# Patient Record
Sex: Female | Born: 1960 | Race: White | Hispanic: No | Marital: Married | Smoking: Current every day smoker
Health system: Southern US, Community
[De-identification: ages and names within clinical notes are randomized; demographics above are authoritative.]

## PROBLEM LIST (undated history)

## (undated) DIAGNOSIS — M1990 Unspecified osteoarthritis, unspecified site: Secondary | ICD-10-CM

## (undated) DIAGNOSIS — K219 Gastro-esophageal reflux disease without esophagitis: Secondary | ICD-10-CM

## (undated) DIAGNOSIS — R0602 Shortness of breath: Secondary | ICD-10-CM

## (undated) DIAGNOSIS — C3490 Malignant neoplasm of unspecified part of unspecified bronchus or lung: Secondary | ICD-10-CM

## (undated) DIAGNOSIS — Z720 Tobacco use: Secondary | ICD-10-CM

## (undated) DIAGNOSIS — F32A Depression, unspecified: Secondary | ICD-10-CM

## (undated) DIAGNOSIS — F419 Anxiety disorder, unspecified: Secondary | ICD-10-CM

## (undated) DIAGNOSIS — F329 Major depressive disorder, single episode, unspecified: Secondary | ICD-10-CM

## (undated) DIAGNOSIS — E785 Hyperlipidemia, unspecified: Secondary | ICD-10-CM

## (undated) DIAGNOSIS — Z8719 Personal history of other diseases of the digestive system: Secondary | ICD-10-CM

## (undated) HISTORY — PX: VAGINAL HYSTERECTOMY: SUR661

## (undated) HISTORY — PX: TUBAL LIGATION: SHX77

## (undated) HISTORY — PX: BREAST LUMPECTOMY: SHX2

## (undated) HISTORY — DX: Malignant neoplasm of unspecified part of unspecified bronchus or lung: C34.90

## (undated) HISTORY — PX: NASAL SEPTUM SURGERY: SHX37

## (undated) HISTORY — DX: Unspecified osteoarthritis, unspecified site: M19.90

## (undated) HISTORY — DX: Anxiety disorder, unspecified: F41.9

## (undated) HISTORY — DX: Tobacco use: Z72.0

## (undated) HISTORY — DX: Hyperlipidemia, unspecified: E78.5

---

## 2009-11-24 ENCOUNTER — Ambulatory Visit (HOSPITAL_COMMUNITY): Admission: RE | Admit: 2009-11-24 | Discharge: 2009-11-24 | Payer: Self-pay | Admitting: Internal Medicine

## 2009-11-25 ENCOUNTER — Inpatient Hospital Stay (HOSPITAL_COMMUNITY): Admission: RE | Admit: 2009-11-25 | Discharge: 2009-11-30 | Payer: Self-pay | Admitting: Internal Medicine

## 2010-01-01 ENCOUNTER — Ambulatory Visit (HOSPITAL_COMMUNITY): Admission: RE | Admit: 2010-01-01 | Discharge: 2010-01-01 | Payer: Self-pay | Admitting: Internal Medicine

## 2010-05-27 ENCOUNTER — Other Ambulatory Visit (HOSPITAL_COMMUNITY): Payer: Self-pay | Admitting: Internal Medicine

## 2010-05-27 ENCOUNTER — Ambulatory Visit (HOSPITAL_COMMUNITY)
Admission: RE | Admit: 2010-05-27 | Discharge: 2010-05-27 | Disposition: A | Discharge status: Home / Self Care | Payer: Managed Care, Other (non HMO) | Source: Ambulatory Visit | Attending: Internal Medicine | Admitting: Internal Medicine

## 2010-05-27 DIAGNOSIS — R05 Cough: Secondary | ICD-10-CM

## 2010-05-27 DIAGNOSIS — F17200 Nicotine dependence, unspecified, uncomplicated: Secondary | ICD-10-CM

## 2010-05-27 DIAGNOSIS — M7120 Synovial cyst of popliteal space [Baker], unspecified knee: Secondary | ICD-10-CM | POA: Insufficient documentation

## 2010-05-27 DIAGNOSIS — R059 Cough, unspecified: Secondary | ICD-10-CM | POA: Insufficient documentation

## 2010-05-27 DIAGNOSIS — Z139 Encounter for screening, unspecified: Secondary | ICD-10-CM

## 2010-05-27 DIAGNOSIS — M79605 Pain in left leg: Secondary | ICD-10-CM

## 2010-05-27 DIAGNOSIS — M79609 Pain in unspecified limb: Secondary | ICD-10-CM | POA: Insufficient documentation

## 2010-06-01 ENCOUNTER — Ambulatory Visit (HOSPITAL_COMMUNITY)
Admission: RE | Admit: 2010-06-01 | Discharge: 2010-06-01 | Disposition: A | Discharge status: Home / Self Care | Payer: Managed Care, Other (non HMO) | Source: Ambulatory Visit | Attending: Internal Medicine | Admitting: Internal Medicine

## 2010-06-01 DIAGNOSIS — Z1231 Encounter for screening mammogram for malignant neoplasm of breast: Secondary | ICD-10-CM | POA: Insufficient documentation

## 2010-06-01 DIAGNOSIS — Z139 Encounter for screening, unspecified: Secondary | ICD-10-CM

## 2010-06-19 LAB — CBC
HCT: 32.6 % — ABNORMAL LOW (ref 36.0–46.0)
HCT: 32.9 % — ABNORMAL LOW (ref 36.0–46.0)
Hemoglobin: 10.9 g/dL — ABNORMAL LOW (ref 12.0–15.0)
MCH: 30.9 pg (ref 26.0–34.0)
MCHC: 31.9 g/dL (ref 30.0–36.0)
MCV: 92.4 fL (ref 78.0–100.0)
MCV: 92.7 fL (ref 78.0–100.0)
Platelets: 487 10*3/uL — ABNORMAL HIGH (ref 150–400)
Platelets: 552 10*3/uL — ABNORMAL HIGH (ref 150–400)
RBC: 4.14 MIL/uL (ref 3.87–5.11)
RDW: 14.1 % (ref 11.5–15.5)
RDW: 14.4 % (ref 11.5–15.5)
RDW: 14.7 % (ref 11.5–15.5)
WBC: 24 10*3/uL — ABNORMAL HIGH (ref 4.0–10.5)

## 2010-06-19 LAB — AFB CULTURE WITH SMEAR (NOT AT ARMC)

## 2010-06-19 LAB — COMPREHENSIVE METABOLIC PANEL
ALT: 14 U/L (ref 0–35)
Albumin: 3 g/dL — ABNORMAL LOW (ref 3.5–5.2)
BUN: 10 mg/dL (ref 6–23)
CO2: 29 mEq/L (ref 19–32)
Calcium: 9.2 mg/dL (ref 8.4–10.5)
Creatinine, Ser: 0.69 mg/dL (ref 0.4–1.2)
GFR calc Af Amer: >60 mL/min (ref >60)
Potassium: 2.8 mEq/L — ABNORMAL LOW (ref 3.5–5.1)
Sodium: 141 mEq/L (ref 135–145)
Total Protein: 7.8 g/dL (ref 6.0–8.3)

## 2010-06-19 LAB — BASIC METABOLIC PANEL
BUN: 10 mg/dL (ref 6–23)
BUN: 5 mg/dL — ABNORMAL LOW (ref 6–23)
Calcium: 8.7 mg/dL (ref 8.4–10.5)
Chloride: 106 mEq/L (ref 96–112)
Creatinine, Ser: 0.6 mg/dL (ref 0.4–1.2)
GFR calc Af Amer: >60 mL/min (ref >60)
GFR calc non Af Amer: >60 mL/min (ref >60)
Glucose, Bld: 100 mg/dL — ABNORMAL HIGH (ref 70–99)
Potassium: 3.8 mEq/L (ref 3.5–5.1)
Potassium: 4.1 mEq/L (ref 3.5–5.1)

## 2010-06-19 LAB — DIFFERENTIAL
Basophils Absolute: 0 10*3/uL (ref 0.0–0.1)
Basophils Absolute: 0.1 10*3/uL (ref 0.0–0.1)
Basophils Absolute: 0.1 10*3/uL (ref 0.0–0.1)
Basophils Relative: 0 % (ref 0–1)
Basophils Relative: 0 % (ref 0–1)
Basophils Relative: 1 % (ref 0–1)
Eosinophils Absolute: 0 10*3/uL (ref 0.0–0.7)
Eosinophils Absolute: 0.1 10*3/uL (ref 0.0–0.7)
Eosinophils Absolute: 0.3 10*3/uL (ref 0.0–0.7)
Eosinophils Relative: 1 % (ref 0–5)
Eosinophils Relative: 2 % (ref 0–5)
Lymphocytes Relative: 8 % — ABNORMAL LOW (ref 12–46)
Monocytes Relative: 11 % (ref 3–12)
Neutro Abs: 21.1 10*3/uL — ABNORMAL HIGH (ref 1.7–7.7)

## 2010-06-19 LAB — CULTURE, BLOOD (ROUTINE X 2)
Culture: NO GROWTH
Culture: NO GROWTH
Report Status: 8282011
Report Status: 8282011

## 2010-06-19 LAB — EXPECTORATED SPUTUM ASSESSMENT W GRAM STAIN, RFLX TO RESP C

## 2010-06-19 LAB — CULTURE, RESPIRATORY W GRAM STAIN

## 2010-06-19 LAB — TSH: TSH: 0.071 u[IU]/mL — ABNORMAL LOW (ref 0.350–4.500)

## 2011-01-12 ENCOUNTER — Ambulatory Visit (HOSPITAL_COMMUNITY)
Admission: RE | Admit: 2011-01-12 | Discharge: 2011-01-12 | Disposition: A | Discharge status: Home / Self Care | Payer: Managed Care, Other (non HMO) | Source: Ambulatory Visit | Attending: Family Medicine | Admitting: Family Medicine

## 2011-01-12 ENCOUNTER — Other Ambulatory Visit (HOSPITAL_COMMUNITY): Payer: Self-pay | Admitting: Family Medicine

## 2011-01-12 DIAGNOSIS — R1013 Epigastric pain: Secondary | ICD-10-CM

## 2011-01-12 DIAGNOSIS — R110 Nausea: Secondary | ICD-10-CM | POA: Insufficient documentation

## 2011-01-12 DIAGNOSIS — R109 Unspecified abdominal pain: Secondary | ICD-10-CM | POA: Insufficient documentation

## 2011-06-23 ENCOUNTER — Other Ambulatory Visit (HOSPITAL_COMMUNITY): Payer: Self-pay | Admitting: Family Medicine

## 2011-06-23 ENCOUNTER — Ambulatory Visit (HOSPITAL_COMMUNITY)
Admission: RE | Admit: 2011-06-23 | Discharge: 2011-06-23 | Disposition: A | Discharge status: Home / Self Care | Payer: Managed Care, Other (non HMO) | Source: Ambulatory Visit | Attending: Family Medicine | Admitting: Family Medicine

## 2011-06-23 DIAGNOSIS — J209 Acute bronchitis, unspecified: Secondary | ICD-10-CM | POA: Insufficient documentation

## 2011-06-23 DIAGNOSIS — Z87891 Personal history of nicotine dependence: Secondary | ICD-10-CM | POA: Insufficient documentation

## 2011-06-23 DIAGNOSIS — R05 Cough: Secondary | ICD-10-CM | POA: Insufficient documentation

## 2011-06-23 DIAGNOSIS — Z139 Encounter for screening, unspecified: Secondary | ICD-10-CM

## 2011-06-23 DIAGNOSIS — R059 Cough, unspecified: Secondary | ICD-10-CM | POA: Insufficient documentation

## 2011-06-23 DIAGNOSIS — R0989 Other specified symptoms and signs involving the circulatory and respiratory systems: Secondary | ICD-10-CM | POA: Insufficient documentation

## 2011-06-23 DIAGNOSIS — R079 Chest pain, unspecified: Secondary | ICD-10-CM | POA: Insufficient documentation

## 2011-06-23 DIAGNOSIS — J9819 Other pulmonary collapse: Secondary | ICD-10-CM | POA: Insufficient documentation

## 2011-06-28 ENCOUNTER — Ambulatory Visit (HOSPITAL_COMMUNITY): Payer: Managed Care, Other (non HMO)

## 2011-09-09 ENCOUNTER — Ambulatory Visit (HOSPITAL_COMMUNITY)
Admission: RE | Admit: 2011-09-09 | Discharge: 2011-09-09 | Disposition: A | Discharge status: Home / Self Care | Payer: Managed Care, Other (non HMO) | Source: Ambulatory Visit | Attending: Family Medicine | Admitting: Family Medicine

## 2011-09-09 DIAGNOSIS — Z1231 Encounter for screening mammogram for malignant neoplasm of breast: Secondary | ICD-10-CM | POA: Insufficient documentation

## 2011-09-09 DIAGNOSIS — Z139 Encounter for screening, unspecified: Secondary | ICD-10-CM

## 2011-09-15 ENCOUNTER — Ambulatory Visit (HOSPITAL_COMMUNITY)
Admission: RE | Admit: 2011-09-15 | Discharge: 2011-09-15 | Disposition: A | Discharge status: Home / Self Care | Payer: Managed Care, Other (non HMO) | Source: Ambulatory Visit | Attending: Family Medicine | Admitting: Family Medicine

## 2011-09-15 ENCOUNTER — Other Ambulatory Visit (HOSPITAL_COMMUNITY): Payer: Self-pay | Admitting: Family Medicine

## 2011-09-15 DIAGNOSIS — J069 Acute upper respiratory infection, unspecified: Secondary | ICD-10-CM | POA: Insufficient documentation

## 2013-04-12 ENCOUNTER — Ambulatory Visit (HOSPITAL_COMMUNITY)
Admission: RE | Admit: 2013-04-12 | Discharge: 2013-04-12 | Disposition: A | Discharge status: Home / Self Care | Payer: Managed Care, Other (non HMO) | Source: Ambulatory Visit | Attending: Family Medicine | Admitting: Family Medicine

## 2013-04-12 ENCOUNTER — Other Ambulatory Visit (HOSPITAL_COMMUNITY): Payer: Self-pay | Admitting: Family Medicine

## 2013-04-12 DIAGNOSIS — J069 Acute upper respiratory infection, unspecified: Secondary | ICD-10-CM

## 2013-04-12 DIAGNOSIS — R05 Cough: Secondary | ICD-10-CM | POA: Insufficient documentation

## 2013-04-12 DIAGNOSIS — F17200 Nicotine dependence, unspecified, uncomplicated: Secondary | ICD-10-CM | POA: Insufficient documentation

## 2013-04-12 DIAGNOSIS — R059 Cough, unspecified: Secondary | ICD-10-CM | POA: Insufficient documentation

## 2013-04-12 DIAGNOSIS — R0989 Other specified symptoms and signs involving the circulatory and respiratory systems: Secondary | ICD-10-CM | POA: Insufficient documentation

## 2013-04-12 DIAGNOSIS — J441 Chronic obstructive pulmonary disease with (acute) exacerbation: Secondary | ICD-10-CM

## 2013-04-13 ENCOUNTER — Other Ambulatory Visit (HOSPITAL_COMMUNITY): Payer: Self-pay | Admitting: Family Medicine

## 2013-04-13 DIAGNOSIS — R599 Enlarged lymph nodes, unspecified: Secondary | ICD-10-CM

## 2013-04-13 DIAGNOSIS — J069 Acute upper respiratory infection, unspecified: Secondary | ICD-10-CM

## 2013-04-13 DIAGNOSIS — J441 Chronic obstructive pulmonary disease with (acute) exacerbation: Secondary | ICD-10-CM

## 2013-04-16 ENCOUNTER — Ambulatory Visit (HOSPITAL_COMMUNITY)
Admission: RE | Admit: 2013-04-16 | Discharge: 2013-04-16 | Disposition: A | Discharge status: Home / Self Care | Payer: Managed Care, Other (non HMO) | Source: Ambulatory Visit | Attending: Family Medicine | Admitting: Family Medicine

## 2013-04-16 DIAGNOSIS — R918 Other nonspecific abnormal finding of lung field: Secondary | ICD-10-CM | POA: Insufficient documentation

## 2013-04-16 DIAGNOSIS — J069 Acute upper respiratory infection, unspecified: Secondary | ICD-10-CM

## 2013-04-16 DIAGNOSIS — F17200 Nicotine dependence, unspecified, uncomplicated: Secondary | ICD-10-CM | POA: Insufficient documentation

## 2013-04-16 DIAGNOSIS — R059 Cough, unspecified: Secondary | ICD-10-CM | POA: Insufficient documentation

## 2013-04-16 DIAGNOSIS — R05 Cough: Secondary | ICD-10-CM | POA: Insufficient documentation

## 2013-04-16 DIAGNOSIS — R599 Enlarged lymph nodes, unspecified: Secondary | ICD-10-CM

## 2013-04-16 DIAGNOSIS — J441 Chronic obstructive pulmonary disease with (acute) exacerbation: Secondary | ICD-10-CM

## 2013-04-16 DIAGNOSIS — R079 Chest pain, unspecified: Secondary | ICD-10-CM | POA: Insufficient documentation

## 2013-04-16 MED ORDER — IOHEXOL 300 MG/ML  SOLN
80.0000 mL | Freq: Once | INTRAMUSCULAR | Status: AC | PRN
  Administered 2013-04-16: 80 mL via INTRAVENOUS

## 2013-04-17 ENCOUNTER — Other Ambulatory Visit (HOSPITAL_COMMUNITY): Payer: Managed Care, Other (non HMO)

## 2013-04-23 ENCOUNTER — Other Ambulatory Visit (HOSPITAL_COMMUNITY): Payer: Self-pay | Admitting: Family Medicine

## 2013-04-23 DIAGNOSIS — D491 Neoplasm of unspecified behavior of respiratory system: Secondary | ICD-10-CM

## 2013-04-24 ENCOUNTER — Encounter: Payer: Self-pay | Admitting: *Deleted

## 2013-04-24 DIAGNOSIS — R590 Localized enlarged lymph nodes: Secondary | ICD-10-CM

## 2013-04-24 DIAGNOSIS — R918 Other nonspecific abnormal finding of lung field: Secondary | ICD-10-CM

## 2013-04-25 ENCOUNTER — Encounter (INDEPENDENT_AMBULATORY_CARE_PROVIDER_SITE_OTHER): Payer: Managed Care, Other (non HMO) | Admitting: Thoracic Surgery (Cardiothoracic Vascular Surgery)

## 2013-04-26 ENCOUNTER — Encounter: Payer: Managed Care, Other (non HMO) | Admitting: Thoracic Surgery (Cardiothoracic Vascular Surgery)

## 2013-04-27 ENCOUNTER — Encounter: Payer: Self-pay | Admitting: Thoracic Surgery (Cardiothoracic Vascular Surgery)

## 2013-04-27 ENCOUNTER — Institutional Professional Consult (permissible substitution) (INDEPENDENT_AMBULATORY_CARE_PROVIDER_SITE_OTHER): Payer: Managed Care, Other (non HMO) | Admitting: Thoracic Surgery (Cardiothoracic Vascular Surgery)

## 2013-04-27 VITALS — BP 115/68 | HR 87 | Resp 16 | Ht 63.0 in | Wt 128.0 lb

## 2013-04-27 DIAGNOSIS — R599 Enlarged lymph nodes, unspecified: Secondary | ICD-10-CM

## 2013-04-27 DIAGNOSIS — R222 Localized swelling, mass and lump, trunk: Secondary | ICD-10-CM

## 2013-04-27 DIAGNOSIS — R591 Generalized enlarged lymph nodes: Secondary | ICD-10-CM

## 2013-04-27 DIAGNOSIS — R918 Other nonspecific abnormal finding of lung field: Secondary | ICD-10-CM

## 2013-04-27 NOTE — Progress Notes (Signed)
PCP is Glo Herring., MD Referring Provider is Redmond School, MD  Chief Complaint  Patient presents with  . Lung Lesion    and adenopathy per CT CHEST ,,,PET scheduled for 05/01/13    HPI: 53 year old woman sent for consultation regarding a left lung mass.  Heather Coffey is a 53 year old woman with a history of tobacco abuse and a remote history of a right lung abscess. She has smoked one to one half pack per day for about 35 years. She continues to smoke. He says that over the past couple of months she's been feeling tired and rundown. She's had a cough. This is worse at night. She's not had any hemoptysis. She also has had some subjective fever and chills. She has not had any shaking chills or night sweats. She says she has lost about 10 pounds over the past 3 months. She continues to work. She says that she does get short of breath with heavy exertion but feels she could walk up 2 flights of stairs before getting short of breath.  She went to see Dr. Gerarda Fraction. He started her on antibiotics and did a chest x-ray. The chest x-ray showed a fullness in the left hilum. A CT of the chest was done on January 12. It showed endobronchial obstruction in the lingula with mass effect on the left upper lobe bronchus there was a 3.1 x 2.6 cm mass in the lingula with irregular margins. There was hilar and mediastinal and aortopulmonary window adenopathy.  ECOG/ZUBROD= 0   Past Medical History  Diagnosis Date  . Arthritis   . Anxiety   . Hyperlipidemia   . Tobacco abuse     No past surgical history on file.  Family History  Problem Relation Age of Onset  . Cancer Sister     uterine  . COPD Mother   . COPD Father   . COPD Sister   . Hypercholesterolemia Mother   . Diabetes Sister     Social History History  Substance Use Topics  . Smoking status: Current Every Day Smoker -- 1.00 packs/day for 35 years  . Smokeless tobacco: Never Used  . Alcohol Use: No    Current Outpatient  Prescriptions  Medication Sig Dispense Refill  . ALPRAZolam (XANAX) 0.5 MG tablet Take 0.5 mg by mouth. Take 1/2 to 1 tablet as needed daily      . aspirin EC 81 MG tablet Take 81 mg by mouth daily.      Marland Kitchen esomeprazole (NEXIUM) 40 MG capsule Take 40 mg by mouth daily.      Marland Kitchen FLUoxetine (PROZAC) 20 MG capsule Take 20 mg by mouth daily.      . fluticasone (FLONASE) 50 MCG/ACT nasal spray Place 2 sprays into both nostrils daily.      . montelukast (SINGULAIR) 10 MG tablet Take 10 mg by mouth daily.      . pravastatin (PRAVACHOL) 20 MG tablet Take 20 mg by mouth daily.      . promethazine (PHENERGAN) 25 MG tablet Take 25 mg by mouth every 6 (six) hours as needed for nausea or vomiting.      . sucralfate (CARAFATE) 1 G tablet Take 1 g by mouth 4 (four) times daily.      Marland Kitchen triamcinolone cream (KENALOG) 0.1 % Apply 1 application topically 2 (two) times daily as needed.      Marland Kitchen ADVAIR DISKUS 250-50 MCG/DOSE AEPB       . albuterol (PROVENTIL) 4 MG tablet       .  ciprofloxacin (CIPRO) 500 MG tablet       . cyclobenzaprine (FLEXERIL) 10 MG tablet        No current facility-administered medications for this visit.    Allergies  Allergen Reactions  . Augmentin [Amoxicillin-Pot Clavulanate]     Metal taste in mouth  . Codeine     Pt states n/v/d  . Tetracyclines & Related Rash    Pt states n/v/d    Review of Systems  Constitutional: Positive for appetite change, fatigue and unexpected weight change (lost 10 pounds in 3 months).  Eyes: Positive for visual disturbance (blurry vision).  Respiratory: Positive for shortness of breath (with heavy exertion) and wheezing. Negative for chest tightness.        Some pleuritic CP  Cardiovascular: Negative for chest pain (no anginal CP).  Gastrointestinal:       Reflux/ heartburn  Musculoskeletal: Positive for arthralgias.  Neurological: Positive for dizziness ("inner ear problems").  Hematological: Negative for adenopathy. Does not bruise/bleed easily.   Psychiatric/Behavioral: Positive for dysphoric mood. The patient is nervous/anxious.   All other systems reviewed and are negative.    BP 115/68  Pulse 87  Resp 16  Ht 5\' 3"  (1.6 m)  Wt 128 lb (58.06 kg)  BMI 22.68 kg/m2  SpO2 96% Physical Exam  Vitals reviewed. Constitutional: She is oriented to person, place, and time. She appears well-developed and well-nourished. No distress.  53 yo female appearing older than her stated age  HENT:  Head: Normocephalic and atraumatic.  Eyes: EOM are normal. Pupils are equal, round, and reactive to light.  Neck: Neck supple. No thyromegaly present.  Cardiovascular: Normal rate, regular rhythm, normal heart sounds and intact distal pulses.  Exam reveals no gallop and no friction rub.   No murmur heard. Pulmonary/Chest: Effort normal.  Rhonchi on left  Abdominal: Soft. There is no tenderness.  Musculoskeletal: She exhibits no edema.  Lymphadenopathy:    She has no cervical adenopathy.  Neurological: She is alert and oriented to person, place, and time. No cranial nerve deficit.  Skin: Skin is warm and dry.     Diagnostic Tests: CT CHEST WITH CONTRAST  TECHNIQUE:  Multidetector CT imaging of the chest was performed during  intravenous contrast administration.  CONTRAST: 22mL OMNIPAQUE IOHEXOL 300 MG/ML SOLN  COMPARISON: DG CHEST 2 VIEW dated 04/12/2013; DG CHEST 2 VIEW dated  09/15/2011; CT CHEST W/CM dated 11/25/2009  FINDINGS:  Lungs/Pleura: Lingular endobronchial obstruction. Mass effect upon  the left upper lobe bronchus.  Mild biapical pleural parenchymal scarring.  Right upper lobe interstitial opacity on image 21/series 3 is likely  scarring from prior cavitary necrotic pneumonia.  Central lingular irregular lung mass which measures 3.1 x 2.6 cm on  transverse images 28-29. 2.5 cm on sagittal image 72. Contacts the  left major fissure.  No pleural fluid.  Heart/Mediastinum: No supraclavicular adenopathy. Age advanced  aortic  and branch vessel atherosclerosis. Normal heart size, without  pericardial effusion. No central pulmonary embolism, on this  non-dedicated study.  Necrotic prevascular adenopathy at 2.3 x 1.4 cm on image 20.  Contiguous AP window adenopathy at 2.6 x 3.7 cm on image 24/series  2. This is associated intimately with the descending aorta and left  pulmonary artery.  No hilar adenopathy, separate from the infrahilar mass.  Fluid level in the thoracic esophagus on image 18.  Upper Abdomen: Normal adrenal glands. Probable atrophy in the upper  pole right kidney.  Bones/Musculoskeletal: No acute osseous abnormality.  IMPRESSION:  1. Plain film abnormality corresponds to a central lingular lung  mass, most consistent with primary bronchogenic carcinoma.  2. Adenopathy within the prevascular and AP window stations with  direct extension into the left-sided mediastinum. The AP window  adenopathy is intimately associated with the descending thoracic  aorta and left pulmonary artery. These results will be called to the  ordering clinician or representative by the Radiologist Assistant,  and communication documented in the PACS Dashboard.  3. These results will be called to the ordering clinician or  representative by the Radiologist Assistant, and communication  documented in the PACS Dashboard.  Electronically Signed  By: Abigail Miyamoto M.D.  On: 04/16/2013 16:35    Impression: 53 year old woman with a history of tobacco abuse who presents with a 2 month history of cough and malaise. A CXR showed left hilar fullness. A CT confirmed a central left upper lobe mass . There also was mediastinal and AP window adenopathy. This picture is consistent with a stage 3 lung cancer. She is scheduled for a PET on Tuesday 1/27, which is needed for staging purposes.   I reviewed the CT images with Heather Coffey. They understand that while this most likely is lung cancer, we need a tissue diagnosis to confirm  that suspicion.   I recommended we proceed with a bronchoscopy and EBUS for diagnosis and staging purposes. We discussed the indications, risks, benefits and alternatives. They understand the risks include those related to general anesthesia, bleeding, pneumothorax and failure to make a diagnosis. She accepts the risks and agrees to proceed.   Plan:  Bronchoscopy and EBUS on Thursday 1/29

## 2013-04-30 ENCOUNTER — Encounter (HOSPITAL_COMMUNITY): Payer: Self-pay | Admitting: Pharmacy Technician

## 2013-04-30 ENCOUNTER — Other Ambulatory Visit: Payer: Self-pay | Admitting: *Deleted

## 2013-04-30 DIAGNOSIS — R918 Other nonspecific abnormal finding of lung field: Secondary | ICD-10-CM

## 2013-04-30 DIAGNOSIS — R590 Localized enlarged lymph nodes: Secondary | ICD-10-CM

## 2013-05-01 ENCOUNTER — Ambulatory Visit (HOSPITAL_COMMUNITY)
Admission: RE | Admit: 2013-05-01 | Discharge: 2013-05-01 | Disposition: A | Discharge status: Home / Self Care | Payer: Managed Care, Other (non HMO) | Source: Ambulatory Visit | Attending: Family Medicine | Admitting: Family Medicine

## 2013-05-01 DIAGNOSIS — R222 Localized swelling, mass and lump, trunk: Secondary | ICD-10-CM | POA: Insufficient documentation

## 2013-05-01 DIAGNOSIS — R599 Enlarged lymph nodes, unspecified: Secondary | ICD-10-CM | POA: Insufficient documentation

## 2013-05-01 DIAGNOSIS — I2510 Atherosclerotic heart disease of native coronary artery without angina pectoris: Secondary | ICD-10-CM | POA: Insufficient documentation

## 2013-05-01 DIAGNOSIS — D491 Neoplasm of unspecified behavior of respiratory system: Secondary | ICD-10-CM

## 2013-05-01 DIAGNOSIS — K8020 Calculus of gallbladder without cholecystitis without obstruction: Secondary | ICD-10-CM | POA: Insufficient documentation

## 2013-05-01 LAB — GLUCOSE, CAPILLARY: Glucose-Capillary: 83 mg/dL (ref 70–99)

## 2013-05-01 MED ORDER — FLUDEOXYGLUCOSE F - 18 (FDG) INJECTION
16.3000 | Freq: Once | INTRAVENOUS | Status: AC | PRN
  Administered 2013-05-01: 16.3 via INTRAVENOUS

## 2013-05-02 ENCOUNTER — Encounter (HOSPITAL_COMMUNITY): Payer: Self-pay | Admitting: *Deleted

## 2013-05-02 NOTE — Progress Notes (Signed)
Pt stated that she saw a doctor at [facility] for SOB and chest pain that she had been experiencing for the last to weeks. Records were requested along with sleep study results that according to pt,  were "inconclusive."

## 2013-05-03 ENCOUNTER — Encounter (HOSPITAL_COMMUNITY): Payer: Managed Care, Other (non HMO) | Admitting: Certified Registered Nurse Anesthetist

## 2013-05-03 ENCOUNTER — Ambulatory Visit (HOSPITAL_COMMUNITY): Payer: Managed Care, Other (non HMO) | Admitting: Certified Registered Nurse Anesthetist

## 2013-05-03 ENCOUNTER — Encounter (HOSPITAL_COMMUNITY)
Admission: RE | Disposition: A | Discharge status: Home / Self Care | Payer: Self-pay | Source: Ambulatory Visit | Attending: Thoracic Surgery (Cardiothoracic Vascular Surgery)

## 2013-05-03 ENCOUNTER — Encounter (HOSPITAL_COMMUNITY): Payer: Self-pay | Admitting: Certified Registered Nurse Anesthetist

## 2013-05-03 ENCOUNTER — Ambulatory Visit (HOSPITAL_COMMUNITY)
Admission: RE | Admit: 2013-05-03 | Discharge: 2013-05-03 | Disposition: A | Discharge status: Home / Self Care | Payer: Managed Care, Other (non HMO) | Source: Ambulatory Visit | Attending: Thoracic Surgery (Cardiothoracic Vascular Surgery) | Admitting: Thoracic Surgery (Cardiothoracic Vascular Surgery)

## 2013-05-03 ENCOUNTER — Ambulatory Visit (HOSPITAL_COMMUNITY): Payer: Managed Care, Other (non HMO)

## 2013-05-03 DIAGNOSIS — C3410 Malignant neoplasm of upper lobe, unspecified bronchus or lung: Secondary | ICD-10-CM | POA: Insufficient documentation

## 2013-05-03 DIAGNOSIS — D381 Neoplasm of uncertain behavior of trachea, bronchus and lung: Secondary | ICD-10-CM

## 2013-05-03 DIAGNOSIS — Z7982 Long term (current) use of aspirin: Secondary | ICD-10-CM | POA: Insufficient documentation

## 2013-05-03 DIAGNOSIS — R918 Other nonspecific abnormal finding of lung field: Secondary | ICD-10-CM

## 2013-05-03 DIAGNOSIS — R599 Enlarged lymph nodes, unspecified: Secondary | ICD-10-CM | POA: Insufficient documentation

## 2013-05-03 DIAGNOSIS — F17200 Nicotine dependence, unspecified, uncomplicated: Secondary | ICD-10-CM | POA: Insufficient documentation

## 2013-05-03 DIAGNOSIS — R590 Localized enlarged lymph nodes: Secondary | ICD-10-CM

## 2013-05-03 HISTORY — DX: Depression, unspecified: F32.A

## 2013-05-03 HISTORY — DX: Major depressive disorder, single episode, unspecified: F32.9

## 2013-05-03 HISTORY — DX: Shortness of breath: R06.02

## 2013-05-03 HISTORY — DX: Gastro-esophageal reflux disease without esophagitis: K21.9

## 2013-05-03 HISTORY — DX: Personal history of other diseases of the digestive system: Z87.19

## 2013-05-03 HISTORY — PX: VIDEO BRONCHOSCOPY WITH ENDOBRONCHIAL ULTRASOUND: SHX6177

## 2013-05-03 LAB — COMPREHENSIVE METABOLIC PANEL
ALBUMIN: 3.1 g/dL — AB (ref 3.5–5.2)
ALT: 8 U/L (ref 0–35)
AST: 15 U/L (ref 0–37)
Alkaline Phosphatase: 83 U/L (ref 39–117)
BUN: 15 mg/dL (ref 6–23)
CO2: 23 mEq/L (ref 19–32)
Calcium: 9.1 mg/dL (ref 8.4–10.5)
Chloride: 107 mEq/L (ref 96–112)
Creatinine, Ser: 0.69 mg/dL (ref 0.50–1.10)
GFR calc non Af Amer: >90 mL/min (ref >90)
Glucose, Bld: 74 mg/dL (ref 70–99)
Potassium: 4.1 mEq/L (ref 3.7–5.3)
Sodium: 145 mEq/L (ref 137–147)
Total Protein: 6.6 g/dL (ref 6.0–8.3)

## 2013-05-03 LAB — CBC
HCT: 39.1 % (ref 36.0–46.0)
Hemoglobin: 13 g/dL (ref 12.0–15.0)
MCH: 30.2 pg (ref 26.0–34.0)
MCHC: 33.2 g/dL (ref 30.0–36.0)
MCV: 90.7 fL (ref 78.0–100.0)
Platelets: 336 10*3/uL (ref 150–400)
RBC: 4.31 MIL/uL (ref 3.87–5.11)
RDW: 15 % (ref 11.5–15.5)
WBC: 7.6 10*3/uL (ref 4.0–10.5)

## 2013-05-03 LAB — PROTIME-INR
INR: 1.06 (ref 0.00–1.49)
PROTHROMBIN TIME: 13.6 s (ref 11.6–15.2)

## 2013-05-03 LAB — APTT: aPTT: 35 seconds (ref 24–37)

## 2013-05-03 SURGERY — BRONCHOSCOPY, WITH EBUS
Anesthesia: General

## 2013-05-03 MED ORDER — ROCURONIUM BROMIDE 50 MG/5ML IV SOLN
INTRAVENOUS | Status: AC
  Filled 2013-05-03: qty 1

## 2013-05-03 MED ORDER — OXYCODONE HCL 5 MG/5ML PO SOLN: 5.0000 mg | Freq: Once | ORAL | Status: AC | PRN

## 2013-05-03 MED ORDER — PHENYLEPHRINE 40 MCG/ML (10ML) SYRINGE FOR IV PUSH (FOR BLOOD PRESSURE SUPPORT)
PREFILLED_SYRINGE | INTRAVENOUS | Status: AC
  Filled 2013-05-03: qty 10

## 2013-05-03 MED ORDER — GLYCOPYRROLATE 0.2 MG/ML IJ SOLN
INTRAMUSCULAR | Status: AC
  Filled 2013-05-03: qty 2

## 2013-05-03 MED ORDER — DEXAMETHASONE SODIUM PHOSPHATE 4 MG/ML IJ SOLN
INTRAMUSCULAR | Status: AC
  Filled 2013-05-03: qty 1

## 2013-05-03 MED ORDER — ROCURONIUM BROMIDE 100 MG/10ML IV SOLN
INTRAVENOUS | Status: DC | PRN
  Administered 2013-05-03: 40 mg via INTRAVENOUS

## 2013-05-03 MED ORDER — FENTANYL CITRATE 0.05 MG/ML IJ SOLN
INTRAMUSCULAR | Status: AC
  Filled 2013-05-03: qty 5

## 2013-05-03 MED ORDER — PROPOFOL 10 MG/ML IV BOLUS
INTRAVENOUS | Status: AC
  Filled 2013-05-03: qty 20

## 2013-05-03 MED ORDER — SODIUM CHLORIDE 0.9 % IJ SOLN
3.0000 mL | INTRAMUSCULAR | Status: DC | PRN
Start: 2013-05-03 — End: 2013-05-03

## 2013-05-03 MED ORDER — OXYCODONE HCL 5 MG PO TABS
5.0000 mg | ORAL_TABLET | Freq: Once | ORAL | Status: AC | PRN
  Administered 2013-05-03: 5 mg via ORAL

## 2013-05-03 MED ORDER — LIDOCAINE HCL (CARDIAC) 20 MG/ML IV SOLN
INTRAVENOUS | Status: AC
  Filled 2013-05-03: qty 5

## 2013-05-03 MED ORDER — GLYCOPYRROLATE 0.2 MG/ML IJ SOLN
INTRAMUSCULAR | Status: DC | PRN
  Administered 2013-05-03: 0.4 mg via INTRAVENOUS

## 2013-05-03 MED ORDER — ONDANSETRON HCL 4 MG/2ML IJ SOLN
INTRAMUSCULAR | Status: DC | PRN
  Administered 2013-05-03: 4 mg via INTRAVENOUS

## 2013-05-03 MED ORDER — PHENYLEPHRINE HCL 10 MG/ML IJ SOLN
INTRAMUSCULAR | Status: DC | PRN
  Administered 2013-05-03: 40 ug via INTRAVENOUS
  Administered 2013-05-03 (×3): 80 ug via INTRAVENOUS
  Administered 2013-05-03 (×5): 40 ug via INTRAVENOUS

## 2013-05-03 MED ORDER — MIDAZOLAM HCL 2 MG/2ML IJ SOLN
INTRAMUSCULAR | Status: AC
  Filled 2013-05-03: qty 2

## 2013-05-03 MED ORDER — DEXAMETHASONE SODIUM PHOSPHATE 4 MG/ML IJ SOLN
INTRAMUSCULAR | Status: DC | PRN
  Administered 2013-05-03: 4 mg via INTRAVENOUS

## 2013-05-03 MED ORDER — EPINEPHRINE HCL 1 MG/ML IJ SOLN
INTRAMUSCULAR | Status: DC | PRN
  Administered 2013-05-03: 1 mg

## 2013-05-03 MED ORDER — MIDAZOLAM HCL 5 MG/5ML IJ SOLN
INTRAMUSCULAR | Status: DC | PRN
  Administered 2013-05-03: 2 mg via INTRAVENOUS

## 2013-05-03 MED ORDER — ACETAMINOPHEN 325 MG PO TABS
650.0000 mg | ORAL_TABLET | ORAL | Status: DC | PRN
  Administered 2013-05-03: 650 mg via ORAL

## 2013-05-03 MED ORDER — NEOSTIGMINE METHYLSULFATE 1 MG/ML IJ SOLN
INTRAMUSCULAR | Status: DC | PRN
  Administered 2013-05-03: 3 mg via INTRAVENOUS

## 2013-05-03 MED ORDER — ARTIFICIAL TEARS OP OINT
TOPICAL_OINTMENT | OPHTHALMIC | Status: DC | PRN
  Administered 2013-05-03: 1 via OPHTHALMIC

## 2013-05-03 MED ORDER — ACETAMINOPHEN 650 MG RE SUPP: 650.0000 mg | RECTAL | Status: DC | PRN

## 2013-05-03 MED ORDER — ALBUTEROL SULFATE HFA 108 (90 BASE) MCG/ACT IN AERS
INHALATION_SPRAY | RESPIRATORY_TRACT | Status: DC | PRN
  Administered 2013-05-03: 4 via RESPIRATORY_TRACT

## 2013-05-03 MED ORDER — HYDROMORPHONE HCL PF 1 MG/ML IJ SOLN: 0.2500 mg | INTRAMUSCULAR | Status: DC | PRN

## 2013-05-03 MED ORDER — SODIUM CHLORIDE 0.9 % IV SOLN: 250.0000 mL | INTRAVENOUS | Status: DC | PRN

## 2013-05-03 MED ORDER — NEOSTIGMINE METHYLSULFATE 1 MG/ML IJ SOLN
INTRAMUSCULAR | Status: AC
  Filled 2013-05-03: qty 10

## 2013-05-03 MED ORDER — EPINEPHRINE HCL 1 MG/ML IJ SOLN
INTRAMUSCULAR | Status: AC
  Filled 2013-05-03: qty 1

## 2013-05-03 MED ORDER — SODIUM CHLORIDE 0.9 % IJ SOLN
3.0000 mL | Freq: Two times a day (BID) | INTRAMUSCULAR | Status: DC
Start: 2013-05-03 — End: 2013-05-03

## 2013-05-03 MED ORDER — LACTATED RINGERS IV SOLN
INTRAVENOUS | Status: DC
  Administered 2013-05-03 (×2): via INTRAVENOUS

## 2013-05-03 MED ORDER — 0.9 % SODIUM CHLORIDE (POUR BTL) OPTIME
TOPICAL | Status: DC | PRN
  Administered 2013-05-03: 1000 mL

## 2013-05-03 MED ORDER — ONDANSETRON HCL 4 MG/2ML IJ SOLN
INTRAMUSCULAR | Status: AC
  Filled 2013-05-03: qty 2

## 2013-05-03 MED ORDER — METOCLOPRAMIDE HCL 5 MG/ML IJ SOLN
10.0000 mg | Freq: Once | INTRAMUSCULAR | Status: DC | PRN
Start: 2013-05-03 — End: 2013-05-03

## 2013-05-03 MED ORDER — ALBUTEROL SULFATE HFA 108 (90 BASE) MCG/ACT IN AERS
INHALATION_SPRAY | RESPIRATORY_TRACT | Status: AC
  Filled 2013-05-03: qty 6.7

## 2013-05-03 MED ORDER — ACETAMINOPHEN 325 MG PO TABS
ORAL_TABLET | ORAL | Status: AC
  Administered 2013-05-03: 650 mg via ORAL
  Filled 2013-05-03: qty 2

## 2013-05-03 MED ORDER — PROPOFOL 10 MG/ML IV BOLUS
INTRAVENOUS | Status: DC | PRN
  Administered 2013-05-03: 30 mg via INTRAVENOUS
  Administered 2013-05-03: 170 mg via INTRAVENOUS

## 2013-05-03 MED ORDER — OXYCODONE HCL 5 MG PO TABS
ORAL_TABLET | ORAL | Status: AC
  Administered 2013-05-03: 5 mg via ORAL
  Filled 2013-05-03: qty 1

## 2013-05-03 MED ORDER — FENTANYL CITRATE 0.05 MG/ML IJ SOLN
INTRAMUSCULAR | Status: DC | PRN
  Administered 2013-05-03: 50 ug via INTRAVENOUS

## 2013-05-03 MED ORDER — LIDOCAINE HCL (CARDIAC) 20 MG/ML IV SOLN
INTRAVENOUS | Status: DC | PRN
  Administered 2013-05-03: 50 mg via INTRAVENOUS

## 2013-05-03 SURGICAL SUPPLY — 35 items
BRUSH CYTOL CELLEBRITY 1.5X140 (MISCELLANEOUS) ×3 IMPLANT
BRUSH SUPERTRAX NDL-TIP CYTO (INSTRUMENTS) ×3 IMPLANT
CANISTER SUCTION 2500CC (MISCELLANEOUS) ×3 IMPLANT
CONT SPEC 4OZ CLIKSEAL STRL BL (MISCELLANEOUS) ×3 IMPLANT
COTTONBALL LRG STERILE PKG (GAUZE/BANDAGES/DRESSINGS) IMPLANT
COVER TABLE BACK 60X90 (DRAPES) ×3 IMPLANT
FILTER STRAW FLUID ASPIR (MISCELLANEOUS) IMPLANT
FORCEPS BIOP RJ4 1.8 (CUTTING FORCEPS) IMPLANT
GLOVE BIO SURGEON STRL SZ 6.5 (GLOVE) ×2 IMPLANT
GLOVE BIO SURGEONS STRL SZ 6.5 (GLOVE) ×1
GLOVE BIOGEL PI IND STRL 6.5 (GLOVE) ×1 IMPLANT
GLOVE BIOGEL PI INDICATOR 6.5 (GLOVE) ×2
GLOVE SURG SIGNA 7.5 PF LTX (GLOVE) ×3 IMPLANT
GOWN PREVENTION PLUS XLARGE (GOWN DISPOSABLE) ×3 IMPLANT
GOWN STRL REUS W/ TWL LRG LVL3 (GOWN DISPOSABLE) ×1 IMPLANT
GOWN STRL REUS W/TWL LRG LVL3 (GOWN DISPOSABLE) ×2
KIT ROOM TURNOVER OR (KITS) ×3 IMPLANT
MARKER SKIN DUAL TIP RULER LAB (MISCELLANEOUS) ×3 IMPLANT
NEEDLE 22X1 1/2 (OR ONLY) (NEEDLE) IMPLANT
NEEDLE BIOPSY TRANSBRONCH 21G (NEEDLE) ×3 IMPLANT
NEEDLE BLUNT 18X1 FOR OR ONLY (NEEDLE) IMPLANT
NEEDLE SYS SONOTIP II EBUSTBNA (NEEDLE) ×3 IMPLANT
NS IRRIG 1000ML POUR BTL (IV SOLUTION) ×3 IMPLANT
OIL SILICONE PENTAX (PARTS (SERVICE/REPAIRS)) ×3 IMPLANT
PAD ARMBOARD 7.5X6 YLW CONV (MISCELLANEOUS) ×6 IMPLANT
SPONGE GAUZE 4X4 12PLY (GAUZE/BANDAGES/DRESSINGS) ×3 IMPLANT
SYR 20CC LL (SYRINGE) ×3 IMPLANT
SYR 20ML ECCENTRIC (SYRINGE) ×3 IMPLANT
SYR 5ML LL (SYRINGE) ×3 IMPLANT
SYR 5ML LUER SLIP (SYRINGE) ×3 IMPLANT
SYR CONTROL 10ML LL (SYRINGE) IMPLANT
TOWEL OR 17X24 6PK STRL BLUE (TOWEL DISPOSABLE) ×3 IMPLANT
TRAP SPECIMEN MUCOUS 40CC (MISCELLANEOUS) ×3 IMPLANT
TUBE CONNECTING 12'X1/4 (SUCTIONS) ×1
TUBE CONNECTING 12X1/4 (SUCTIONS) ×2 IMPLANT

## 2013-05-03 NOTE — Anesthesia Preprocedure Evaluation (Signed)
Anesthesia Evaluation  Patient identified by MRN, date of birth, ID band Patient awake    Reviewed: Allergy & Precautions, H&P , NPO status , Patient's Chart, lab work & pertinent test results, reviewed documented beta blocker date and time   Airway Mallampati: II TM Distance: >3 FB Neck ROM: full    Dental   Pulmonary shortness of breath and with exertion, Current Smoker,    + wheezing      Cardiovascular negative cardio ROS  Rhythm:regular     Neuro/Psych PSYCHIATRIC DISORDERS negative neurological ROS     GI/Hepatic Neg liver ROS, hiatal hernia, GERD-  Controlled and Medicated,  Endo/Other  negative endocrine ROS  Renal/GU negative Renal ROS  negative genitourinary   Musculoskeletal   Abdominal   Peds  Hematology  (+) Blood dyscrasia, ,   Anesthesia Other Findings See surgeon's H&P   Reproductive/Obstetrics negative OB ROS                           Anesthesia Physical Anesthesia Plan  ASA: III  Anesthesia Plan: General   Post-op Pain Management:    Induction: Intravenous  Airway Management Planned: Oral ETT  Additional Equipment:   Intra-op Plan:   Post-operative Plan: Extubation in OR  Informed Consent: I have reviewed the patients History and Physical, chart, labs and discussed the procedure including the risks, benefits and alternatives for the proposed anesthesia with the patient or authorized representative who has indicated his/her understanding and acceptance.   Dental Advisory Given  Plan Discussed with: CRNA and Surgeon  Anesthesia Plan Comments:         Anesthesia Quick Evaluation

## 2013-05-03 NOTE — Transfer of Care (Signed)
Immediate Anesthesia Transfer of Care Note  Patient: Heather Coffey  Procedure(s) Performed: Procedure(s): VIDEO BRONCHOSCOPY WITH ENDOBRONCHIAL ULTRASOUND (N/A)  Patient Location: PACU  Anesthesia Type:General  Level of Consciousness: awake, alert  and oriented  Airway & Oxygen Therapy: Patient Spontanous Breathing and Patient connected to nasal cannula oxygen  Post-op Assessment: Report given to PACU RN and Post -op Vital signs reviewed and stable  Post vital signs: Reviewed and stable  Complications: No apparent anesthesia complications

## 2013-05-03 NOTE — Interval H&P Note (Signed)
History and Physical Interval Note:  05/03/2013 2:00 PM  Heather Coffey  has presented today for surgery, with the diagnosis of LEFT LUNG MASS MEDIASTINAL ADENOPATHY  The various methods of treatment have been discussed with the patient and family. After consideration of risks, benefits and other options for treatment, the patient has consented to  Procedure(s): [name] (N/A) as a surgical intervention .  The patient's history has been reviewed, patient examined, no change in status, stable for surgery.  I have reviewed the patient's chart and labs.  Questions were answered to the patient's satisfaction.     Suhas Estis C

## 2013-05-03 NOTE — Anesthesia Postprocedure Evaluation (Signed)
Anesthesia Post Note  Patient: Heather Coffey  Procedure(s) Performed: Procedure(s) (LRB): VIDEO BRONCHOSCOPY WITH ENDOBRONCHIAL ULTRASOUND (N/A)  Anesthesia type: General  Patient location: PACU  Post pain: Pain level controlled  Post assessment: Patient's Cardiovascular Status Stable  Last Vitals:  Filed Vitals:   05/03/13 1615  BP: 114/64  Pulse: 75  Temp:   Resp: 16    Post vital signs: Reviewed and stable  Level of consciousness: alert  Complications: No apparent anesthesia complications

## 2013-05-03 NOTE — Brief Op Note (Signed)
05/03/2013  3:45 PM  PATIENT:  Heather Coffey  53 y.o. female  PRE-OPERATIVE DIAGNOSIS:  LEFT LUNG MASS MEDIASTINAL ADENOPATHY  POST-OPERATIVE DIAGNOSIS:  LEFT LUNG MASS MEDIASTINAL ADENOPATHY  PROCEDURE:  Procedure(s): VIDEO BRONCHOSCOPY WITH ENDOBRONCHIAL ULTRASOUND (N/A) Bronchoscopy with brushings and biopsies  SURGEON:  Surgeon(s) and Role:    * Melrose Nakayama, MD - Primary   ANESTHESIA:   general  EBL:  Total I/O In: 1000 [I.V.:1000] Out: -   BLOOD ADMINISTERED:none  DRAINS: none   LOCAL MEDICATIONS USED:  NONE  SPECIMEN:  Source of Specimen:  Lymph node aspirates, brushings and biopsies of LUL mass  DISPOSITION OF SPECIMEN:  PATHOLOGY  PLAN OF CARE: Discharge to home after PACU  PATIENT DISPOSITION:  PACU - hemodynamically stable.   Delay start of Pharmacological VTE agent (>24hrs) due to surgical blood loss or risk of bleeding: not applicable

## 2013-05-03 NOTE — Anesthesia Procedure Notes (Signed)
Procedure Name: Intubation Date/Time: 05/03/2013 2:22 PM Performed by: [place] Pink Pre-anesthesia Checklist: Patient identified, Timeout performed, Emergency Drugs available, Suction available and Patient being monitored Patient Re-evaluated:Patient Re-evaluated prior to inductionOxygen Delivery Method: Circle system utilized Preoxygenation: Pre-oxygenation with 100% oxygen Intubation Type: IV induction Ventilation: Mask ventilation without difficulty Laryngoscope Size: Mac and 3 Grade View: Grade I Tube type: Oral Tube size: 8.5 mm Number of attempts: 1 Airway Equipment and Method: Stylet Placement Confirmation: ETT inserted through vocal cords under direct vision,  positive ETCO2 and breath sounds checked- equal and bilateral Secured at: 21 cm Tube secured with: Tape Dental Injury: Teeth and Oropharynx as per pre-operative assessment

## 2013-05-03 NOTE — Preoperative (Signed)
Beta Blockers   Reason not to administer Beta Blockers:Not Applicable 

## 2013-05-03 NOTE — H&P (View-Only) (Signed)
PCP is Glo Herring., MD Referring Provider is Redmond School, MD  Chief Complaint  Patient presents with  . Lung Lesion    and adenopathy per CT CHEST ,,,PET scheduled for 05/01/13    HPI: 53 year old woman sent for consultation regarding a left lung mass.  Heather Coffey is a 53 year old woman with a history of tobacco abuse and a remote history of a right lung abscess. She has smoked one to one half pack per day for about 35 years. She continues to smoke. He says that over the past couple of months she's been feeling tired and rundown. She's had a cough. This is worse at night. She's not had any hemoptysis. She also has had some subjective fever and chills. She has not had any shaking chills or night sweats. She says she has lost about 10 pounds over the past 3 months. She continues to work. She says that she does get short of breath with heavy exertion but feels she could walk up 2 flights of stairs before getting short of breath.  She went to see Dr. Gerarda Fraction. He started her on antibiotics and did a chest x-ray. The chest x-ray showed a fullness in the left hilum. A CT of the chest was done on January 12. It showed endobronchial obstruction in the lingula with mass effect on the left upper lobe bronchus there was a 3.1 x 2.6 cm mass in the lingula with irregular margins. There was hilar and mediastinal and aortopulmonary window adenopathy.  ECOG/ZUBROD= 0   Past Medical History  Diagnosis Date  . Arthritis   . Anxiety   . Hyperlipidemia   . Tobacco abuse     No past surgical history on file.  Family History  Problem Relation Age of Onset  . Cancer Sister     uterine  . COPD Mother   . COPD Father   . COPD Sister   . Hypercholesterolemia Mother   . Diabetes Sister     Social History History  Substance Use Topics  . Smoking status: Current Every Day Smoker -- 1.00 packs/day for 35 years  . Smokeless tobacco: Never Used  . Alcohol Use: No    Current Outpatient  Prescriptions  Medication Sig Dispense Refill  . ALPRAZolam (XANAX) 0.5 MG tablet Take 0.5 mg by mouth. Take 1/2 to 1 tablet as needed daily      . aspirin EC 81 MG tablet Take 81 mg by mouth daily.      Marland Kitchen esomeprazole (NEXIUM) 40 MG capsule Take 40 mg by mouth daily.      Marland Kitchen FLUoxetine (PROZAC) 20 MG capsule Take 20 mg by mouth daily.      . fluticasone (FLONASE) 50 MCG/ACT nasal spray Place 2 sprays into both nostrils daily.      . montelukast (SINGULAIR) 10 MG tablet Take 10 mg by mouth daily.      . pravastatin (PRAVACHOL) 20 MG tablet Take 20 mg by mouth daily.      . promethazine (PHENERGAN) 25 MG tablet Take 25 mg by mouth every 6 (six) hours as needed for nausea or vomiting.      . sucralfate (CARAFATE) 1 G tablet Take 1 g by mouth 4 (four) times daily.      Marland Kitchen triamcinolone cream (KENALOG) 0.1 % Apply 1 application topically 2 (two) times daily as needed.      Marland Kitchen ADVAIR DISKUS 250-50 MCG/DOSE AEPB       . albuterol (PROVENTIL) 4 MG tablet       .  ciprofloxacin (CIPRO) 500 MG tablet       . cyclobenzaprine (FLEXERIL) 10 MG tablet        No current facility-administered medications for this visit.    Allergies  Allergen Reactions  . Augmentin [Amoxicillin-Pot Clavulanate]     Metal taste in mouth  . Codeine     Pt states n/v/d  . Tetracyclines & Related Rash    Pt states n/v/d    Review of Systems  Constitutional: Positive for appetite change, fatigue and unexpected weight change (lost 10 pounds in 3 months).  Eyes: Positive for visual disturbance (blurry vision).  Respiratory: Positive for shortness of breath (with heavy exertion) and wheezing. Negative for chest tightness.        Some pleuritic CP  Cardiovascular: Negative for chest pain (no anginal CP).  Gastrointestinal:       Reflux/ heartburn  Musculoskeletal: Positive for arthralgias.  Neurological: Positive for dizziness ("inner ear problems").  Hematological: Negative for adenopathy. Does not bruise/bleed easily.   Psychiatric/Behavioral: Positive for dysphoric mood. The patient is nervous/anxious.   All other systems reviewed and are negative.    BP 115/68  Pulse 87  Resp 16  Ht 5\' 3"  (1.6 m)  Wt 128 lb (58.06 kg)  BMI 22.68 kg/m2  SpO2 96% Physical Exam  Vitals reviewed. Constitutional: She is oriented to person, place, and time. She appears well-developed and well-nourished. No distress.  53 yo female appearing older than her stated age  HENT:  Head: Normocephalic and atraumatic.  Eyes: EOM are normal. Pupils are equal, round, and reactive to light.  Neck: Neck supple. No thyromegaly present.  Cardiovascular: Normal rate, regular rhythm, normal heart sounds and intact distal pulses.  Exam reveals no gallop and no friction rub.   No murmur heard. Pulmonary/Chest: Effort normal.  Rhonchi on left  Abdominal: Soft. There is no tenderness.  Musculoskeletal: She exhibits no edema.  Lymphadenopathy:    She has no cervical adenopathy.  Neurological: She is alert and oriented to person, place, and time. No cranial nerve deficit.  Skin: Skin is warm and dry.     Diagnostic Tests: CT CHEST WITH CONTRAST  TECHNIQUE:  Multidetector CT imaging of the chest was performed during  intravenous contrast administration.  CONTRAST: 40mL OMNIPAQUE IOHEXOL 300 MG/ML SOLN  COMPARISON: DG CHEST 2 VIEW dated 04/12/2013; DG CHEST 2 VIEW dated  09/15/2011; CT CHEST W/CM dated 11/25/2009  FINDINGS:  Lungs/Pleura: Lingular endobronchial obstruction. Mass effect upon  the left upper lobe bronchus.  Mild biapical pleural parenchymal scarring.  Right upper lobe interstitial opacity on image 21/series 3 is likely  scarring from prior cavitary necrotic pneumonia.  Central lingular irregular lung mass which measures 3.1 x 2.6 cm on  transverse images 28-29. 2.5 cm on sagittal image 72. Contacts the  left major fissure.  No pleural fluid.  Heart/Mediastinum: No supraclavicular adenopathy. Age advanced  aortic  and branch vessel atherosclerosis. Normal heart size, without  pericardial effusion. No central pulmonary embolism, on this  non-dedicated study.  Necrotic prevascular adenopathy at 2.3 x 1.4 cm on image 20.  Contiguous AP window adenopathy at 2.6 x 3.7 cm on image 24/series  2. This is associated intimately with the descending aorta and left  pulmonary artery.  No hilar adenopathy, separate from the infrahilar mass.  Fluid level in the thoracic esophagus on image 18.  Upper Abdomen: Normal adrenal glands. Probable atrophy in the upper  pole right kidney.  Bones/Musculoskeletal: No acute osseous abnormality.  IMPRESSION:  1. Plain film abnormality corresponds to a central lingular lung  mass, most consistent with primary bronchogenic carcinoma.  2. Adenopathy within the prevascular and AP window stations with  direct extension into the left-sided mediastinum. The AP window  adenopathy is intimately associated with the descending thoracic  aorta and left pulmonary artery. These results will be called to the  ordering clinician or representative by the Radiologist Assistant,  and communication documented in the PACS Dashboard.  3. These results will be called to the ordering clinician or  representative by the Radiologist Assistant, and communication  documented in the PACS Dashboard.  Electronically Signed  By: Abigail Miyamoto M.D.  On: 04/16/2013 16:35    Impression: 53 year old woman with a history of tobacco abuse who presents with a 2 month history of cough and malaise. A CXR showed left hilar fullness. A CT confirmed a central left upper lobe mass . There also was mediastinal and AP window adenopathy. This picture is consistent with a stage 3 lung cancer. She is scheduled for a PET on Tuesday 1/27, which is needed for staging purposes.   I reviewed the CT images with Mr and Mrs Heather Coffey. They understand that while this most likely is lung cancer, we need a tissue diagnosis to confirm  that suspicion.   I recommended we proceed with a bronchoscopy and EBUS for diagnosis and staging purposes. We discussed the indications, risks, benefits and alternatives. They understand the risks include those related to general anesthesia, bleeding, pneumothorax and failure to make a diagnosis. She accepts the risks and agrees to proceed.   Plan:  Bronchoscopy and EBUS on Thursday 1/29

## 2013-05-03 NOTE — Discharge Instructions (Signed)
Do not drive or engage in heavy physical activity for 24 hours  You may cough up small amounts of blood over the next few days  You may take tylenol or ibuprofen as needed for sore throat  What to eat:  For your first meals, you should eat lightly; only small meals initially.  If you do not have nausea, you may eat larger meals.  Avoid spicy, greasy and heavy food.    General Anesthesia, Adult, Care After  Refer to this sheet in the next few weeks. These instructions provide you with information on caring for yourself after your procedure. Your health care provider may also give you more specific instructions. Your treatment has been planned according to current medical practices, but problems sometimes occur. Call your health care provider if you have any problems or questions after your procedure.  WHAT TO EXPECT AFTER THE PROCEDURE  After the procedure, it is typical to experience:  Sleepiness.  Nausea and vomiting. HOME CARE INSTRUCTIONS  For the first 24 hours after general anesthesia:  Have a responsible person with you.  Do not drive a car. If you are alone, do not take public transportation.  Do not drink alcohol.  Do not take medicine that has not been prescribed by your health care provider.  Do not sign important papers or make important decisions.  You may resume a normal diet and activities as directed by your health care provider.  Change bandages (dressings) as directed.  If you have questions or problems that seem related to general anesthesia, call the hospital and ask for the anesthetist or anesthesiologist on call. SEEK MEDICAL CARE IF:  You have nausea and vomiting that continue the day after anesthesia.  You develop a rash. SEEK IMMEDIATE MEDICAL CARE IF:  You have difficulty breathing.  You have chest pain.  You have any allergic problems. Document Released: 06/28/2000 Document Revised: 11/22/2012 Document Reviewed: 10/05/2012  [facility] Patient Information  2014 [place], [place].

## 2013-05-03 NOTE — Progress Notes (Signed)
Intraoperative images

## 2013-05-04 ENCOUNTER — Encounter (HOSPITAL_COMMUNITY): Payer: Self-pay | Admitting: Thoracic Surgery (Cardiothoracic Vascular Surgery)

## 2013-05-04 NOTE — Op Note (Signed)
NAMEILYSE, TREMAIN                  ACCOUNT NO.:  [id number]  MEDICAL RECORD NO.:  [id number]  LOCATION:  [facility]                         FACILITY:  [facility]  PHYSICIAN:  Revonda Standard. Roxan Hockey, M.D.DATE OF BIRTH:  [birth year]  DATE OF PROCEDURE:  05/03/2013 DATE OF DISCHARGE:  05/03/2013                              OPERATIVE REPORT   PREOPERATIVE DIAGNOSES:  Left upper lobe mass with hilar and mediastinal adenopathy.  POSTOPERATIVE DIAGNOSIS:  Non-small-cell carcinoma, stage III.  PROCEDURE:  Bronchoscopy with brushings and biopsies and endobronchial ultrasound with mediastinal lymph node aspiration.  SURGEON:  Revonda Standard. Roxan Hockey, M.D.  ANESTHESIA:  General.  FINDINGS:  Endobronchial mass lesion at the origin of the lingular segmental bronchus of the left upper lobe, aspiration of mediastinal lymph nodes revealed poor specimens on 4R and 4L #2, 4L #1 showed non- small cell carcinoma.  CLINICAL NOTE:  Ms. Petsch is a 53 year old woman with a history of tobacco abuse who has been feeling poorly for a couple of months with cough and subjective fevers and chills.  A chest x-ray showed a fullness in the left hilum.  A CT of the chest showed endobronchial mass in the left upper lobe with hilar and mediastinal adenopathy, as well as aortopulmonary window adenopathy.  This is highly suspicious for a primary bronchogenic carcinoma.  The patient was advised to undergo bronchoscopy and endobronchial ultrasound for diagnostic and staging purposes.  The indications, risks, benefits, and alternatives were discussed in detail with the patient.  She understood the risks as outlined in the history and physical, accepted them and agreed to proceed.  DESCRIPTION OF PROCEDURE:  Ms. Hunton was brought to the operating room on May 03, 2013.  There, she had induction of general anesthesia. Flexible fiberoptic bronchoscopy was performed via the endotracheal tube.  The trachea and carina were normal  as were the main stem bronchi. There were no endobronchial lesions to the level of subsegmental bronchi on the right side and in the left lower lobe.  Visualization of the left upper lobe bronchus revealed obstruction of the lingular bronchus with endobronchial mass as well as extrinsic compression.  The bronchoscope was removed.  The endobronchial ultrasound probe was passed.  A 4R node was identified.  There was a relatively small window.  An attempt to aspirate this node resulted in a bloody tap and it was hard to get an adequate window with the aspirating needle in place.  A level 4L node was visualized and 2 aspirations were performed  from this node.  Both were done with suction applied and 12 passes of the needle within the node as visualized on ultrasound.  A second 4L node was identified.  An attempted aspiration was performed, but again there was blood aspirated with this as well.  All 3 specimens were sent to pathology.  The endobronchial ultrasound probe was removed.  The bronchoscope was replaced.  Brushings were taken from the lingular segmental bronchus orifice.  These were also sent for quick prep.  All of the remaining specimens were sent for permanent pathology only. Multiple brushings were obtained.  Next, multiple biopsies were taken. This did result in bleeding and dilute epinephrine  was applied to the area to help control bleeding.  After taking multiple biopsies, it appeared a portion of the airway reconstituted and additional brushings were taken from further into the lesion and additional biopsies were as well.  There was minimal ongoing oozing of blood from the biopsy sites.  Additional epinephrine was applied.  At this point the results from the initial specimen returned. Pathology noted inadequate specimens on the first and third lymph nodes, but the second lymph node was positive for non-small cell carcinoma.  The first set of brushings showed atypical  cells.  The remainder of the specimens will be sent for permanent pathology.  A final inspection was made.  There was no ongoing bleeding.  The bronchoscope was removed.  The patient was extubated in the operating room and taken to the postanesthetic care unit in good condition.     Revonda Standard Roxan Hockey, M.D.     SCH/MEDQ  D:  05/03/2013  T:  05/04/2013  Job:  [id number]

## 2013-05-17 ENCOUNTER — Encounter (HOSPITAL_COMMUNITY): Payer: Self-pay

## 2013-05-17 ENCOUNTER — Encounter (HOSPITAL_COMMUNITY): Payer: Self-pay | Admitting: Pharmacy Technician

## 2013-05-17 ENCOUNTER — Other Ambulatory Visit: Payer: Self-pay | Admitting: *Deleted

## 2013-05-17 ENCOUNTER — Encounter (HOSPITAL_COMMUNITY): Payer: Managed Care, Other (non HMO) | Attending: Hematology and Oncology

## 2013-05-17 VITALS — BP 124/68 | HR 85 | Temp 98.2°F | Resp 20 | Ht 65.0 in | Wt 129.4 lb

## 2013-05-17 DIAGNOSIS — C3490 Malignant neoplasm of unspecified part of unspecified bronchus or lung: Secondary | ICD-10-CM

## 2013-05-17 DIAGNOSIS — F411 Generalized anxiety disorder: Secondary | ICD-10-CM | POA: Insufficient documentation

## 2013-05-17 DIAGNOSIS — F3289 Other specified depressive episodes: Secondary | ICD-10-CM | POA: Insufficient documentation

## 2013-05-17 DIAGNOSIS — C3410 Malignant neoplasm of upper lobe, unspecified bronchus or lung: Secondary | ICD-10-CM | POA: Insufficient documentation

## 2013-05-17 DIAGNOSIS — C3430 Malignant neoplasm of lower lobe, unspecified bronchus or lung: Secondary | ICD-10-CM | POA: Insufficient documentation

## 2013-05-17 DIAGNOSIS — K449 Diaphragmatic hernia without obstruction or gangrene: Secondary | ICD-10-CM | POA: Insufficient documentation

## 2013-05-17 DIAGNOSIS — E785 Hyperlipidemia, unspecified: Secondary | ICD-10-CM | POA: Insufficient documentation

## 2013-05-17 DIAGNOSIS — K8020 Calculus of gallbladder without cholecystitis without obstruction: Secondary | ICD-10-CM

## 2013-05-17 DIAGNOSIS — C771 Secondary and unspecified malignant neoplasm of intrathoracic lymph nodes: Secondary | ICD-10-CM

## 2013-05-17 DIAGNOSIS — J449 Chronic obstructive pulmonary disease, unspecified: Secondary | ICD-10-CM

## 2013-05-17 DIAGNOSIS — F17200 Nicotine dependence, unspecified, uncomplicated: Secondary | ICD-10-CM | POA: Insufficient documentation

## 2013-05-17 DIAGNOSIS — K219 Gastro-esophageal reflux disease without esophagitis: Secondary | ICD-10-CM | POA: Insufficient documentation

## 2013-05-17 DIAGNOSIS — F329 Major depressive disorder, single episode, unspecified: Secondary | ICD-10-CM | POA: Insufficient documentation

## 2013-05-17 DIAGNOSIS — J852 Abscess of lung without pneumonia: Secondary | ICD-10-CM | POA: Insufficient documentation

## 2013-05-17 LAB — CBC WITH DIFFERENTIAL/PLATELET
Basophils Absolute: 0.1 10*3/uL (ref 0.0–0.1)
Basophils Relative: 1 % (ref 0–1)
EOS ABS: 0.1 10*3/uL (ref 0.0–0.7)
EOS PCT: 1 % (ref 0–5)
HCT: 41.8 % (ref 36.0–46.0)
HEMOGLOBIN: 13.8 g/dL (ref 12.0–15.0)
LYMPHS ABS: 2.3 10*3/uL (ref 0.7–4.0)
LYMPHS PCT: 28 % (ref 12–46)
MCH: 30 pg (ref 26.0–34.0)
MCHC: 33 g/dL (ref 30.0–36.0)
MCV: 90.9 fL (ref 78.0–100.0)
Monocytes Absolute: 0.9 10*3/uL (ref 0.1–1.0)
Monocytes Relative: 11 % (ref 3–12)
Neutro Abs: 5.1 10*3/uL (ref 1.7–7.7)
Neutrophils Relative %: 61 % (ref 43–77)
PLATELETS: 429 10*3/uL — AB (ref 150–400)
RBC: 4.6 MIL/uL (ref 3.87–5.11)
RDW: 15.3 % (ref 11.5–15.5)
WBC: 8.3 10*3/uL (ref 4.0–10.5)

## 2013-05-17 LAB — COMPREHENSIVE METABOLIC PANEL
ALK PHOS: 103 U/L (ref 39–117)
ALT: 10 U/L (ref 0–35)
AST: 18 U/L (ref 0–37)
Albumin: 3.8 g/dL (ref 3.5–5.2)
BUN: 22 mg/dL (ref 6–23)
CALCIUM: 9.9 mg/dL (ref 8.4–10.5)
CO2: 28 meq/L (ref 19–32)
Chloride: 100 mEq/L (ref 96–112)
Creatinine, Ser: 0.84 mg/dL (ref 0.50–1.10)
GFR calc Af Amer: >90 mL/min (ref >90)
GFR, EST NON AFRICAN AMERICAN: 80 mL/min — AB (ref >90)
Glucose, Bld: 93 mg/dL (ref 70–99)
POTASSIUM: 4 meq/L (ref 3.7–5.3)
SODIUM: 143 meq/L (ref 137–147)
Total Bilirubin: <0.2 mg/dL — ABNORMAL LOW (ref 0.3–1.2)
Total Protein: 8.1 g/dL (ref 6.0–8.3)

## 2013-05-17 LAB — LACTATE DEHYDROGENASE: LDH: 227 U/L (ref 94–250)

## 2013-05-17 NOTE — Patient Instructions (Addendum)
[facility] Discharge Instructions  RECOMMENDATIONS MADE BY THE CONSULTANT AND ANY TEST RESULTS WILL BE SENT TO YOUR REFERRING PHYSICIAN.  EXAM FINDINGS BY THE PHYSICIAN TODAY AND SIGNS OR SYMPTOMS TO REPORT TO CLINIC OR PRIMARY PHYSICIAN:   We will be giving you Carboplatin/Taxol (chemotherapy agents) weekly for the length of time that you do Radiation.  You will need a port-a-cath. This is for safe delivery of your chemotherapy drugs. Interventional Radiology can insert the port.   You can have your radiation done in [place] @ [facility] and have your chemo here @ [facility].   Dr. Orlene Erm is the radiation doctor in [place].   Return to see Dr. Barnet Glasgow in 3 weeks.        Thank you for choosing [facility] to provide your oncology and hematology care.  To afford each patient quality time with our providers, please arrive at least 15 minutes before your scheduled appointment time.  With your help, our goal is to use those 15 minutes to complete the necessary work-up to ensure our physicians have the information they need to help with your evaluation and healthcare recommendations.    Effective January 1st, 2014, we ask that you re-schedule your appointment with our physicians should you arrive 10 or more minutes late for your appointment.  We strive to give you quality time with our providers, and arriving late affects you and other patients whose appointments are after yours.    Again, thank you for choosing [facility].  Our hope is that these requests will decrease the amount of time that you wait before being seen by our physicians.       _____________________________________________________________  Should you have questions after your visit to [facility], please contact our office at [phone] between the hours of 8:30 a.m. and 5:00 p.m.  Voicemails left after 4:30 p.m. will not be  returned until the following business day.  For prescription refill requests, have your pharmacy contact our office with your prescription refill request.    Carboplatin injection What is this medicine? CARBOPLATIN (KAR boe pla tin) is a chemotherapy drug. It targets fast dividing cells, like cancer cells, and causes these cells to die. This medicine is used to treat ovarian cancer and many other cancers. This medicine may be used for other purposes; ask your health care provider or pharmacist if you have questions. COMMON BRAND NAME(S): Paraplatin What should I tell my health care provider before I take this medicine? They need to know if you have any of these conditions: -blood disorders -hearing problems -kidney disease -recent or ongoing radiation therapy -an unusual or allergic reaction to carboplatin, cisplatin, other chemotherapy, other medicines, foods, dyes, or preservatives -pregnant or trying to get pregnant -breast-feeding How should I use this medicine? This drug is usually given as an infusion into a vein. It is administered in a hospital or clinic by a specially trained health care professional. Talk to your pediatrician regarding the use of this medicine in children. Special care may be needed. Overdosage: If you think you have taken too much of this medicine contact a poison control center or emergency room at once. NOTE: This medicine is only for you. Do not share this medicine with others. What if I miss a dose? It is important not to miss a dose. Call your doctor or health care professional if you are unable to keep an appointment. What may  interact with this medicine? -medicines for seizures -medicines to increase blood counts like filgrastim, pegfilgrastim, sargramostim -some antibiotics like amikacin, gentamicin, neomycin, streptomycin, tobramycin -vaccines Talk to your doctor or health care professional before taking any of these  medicines: -acetaminophen -aspirin -ibuprofen -ketoprofen -naproxen This list may not describe all possible interactions. Give your health care provider a list of all the medicines, herbs, non-prescription drugs, or dietary supplements you use. Also tell them if you smoke, drink alcohol, or use illegal drugs. Some items may interact with your medicine. What should I watch for while using this medicine? Your condition will be monitored carefully while you are receiving this medicine. You will need important blood work done while you are taking this medicine. This drug may make you feel generally unwell. This is not uncommon, as chemotherapy can affect healthy cells as well as cancer cells. Report any side effects. Continue your course of treatment even though you feel ill unless your doctor tells you to stop. In some cases, you may be given additional medicines to help with side effects. Follow all directions for their use. Call your doctor or health care professional for advice if you get a fever, chills or sore throat, or other symptoms of a cold or flu. Do not treat yourself. This drug decreases your body's ability to fight infections. Try to avoid being around people who are sick. This medicine may increase your risk to bruise or bleed. Call your doctor or health care professional if you notice any unusual bleeding. Be careful brushing and flossing your teeth or using a toothpick because you may get an infection or bleed more easily. If you have any dental work done, tell your dentist you are receiving this medicine. Avoid taking products that contain aspirin, acetaminophen, ibuprofen, naproxen, or ketoprofen unless instructed by your doctor. These medicines may hide a fever. Do not become pregnant while taking this medicine. Women should inform their doctor if they wish to become pregnant or think they might be pregnant. There is a potential for serious side effects to an unborn child. Talk to  your health care professional or pharmacist for more information. Do not breast-feed an infant while taking this medicine. What side effects may I notice from receiving this medicine? Side effects that you should report to your doctor or health care professional as soon as possible: -allergic reactions like skin rash, itching or hives, swelling of the face, lips, or tongue -signs of infection - fever or chills, cough, sore throat, pain or difficulty passing urine -signs of decreased platelets or bleeding - bruising, pinpoint red spots on the skin, black, tarry stools, nosebleeds -signs of decreased red blood cells - unusually weak or tired, fainting spells, lightheadedness -breathing problems -changes in hearing -changes in vision -chest pain -high blood pressure -low blood counts - This drug may decrease the number of white blood cells, red blood cells and platelets. You may be at increased risk for infections and bleeding. -nausea and vomiting -pain, swelling, redness or irritation at the injection site -pain, tingling, numbness in the hands or feet -problems with balance, talking, walking -trouble passing urine or change in the amount of urine Side effects that usually do not require medical attention (report to your doctor or health care professional if they continue or are bothersome): -hair loss -loss of appetite -metallic taste in the mouth or changes in taste This list may not describe all possible side effects. Call your doctor for medical advice about side effects. You may report  side effects to FDA at 1-800-FDA-1088. Where should I keep my medicine? This drug is given in a hospital or clinic and will not be stored at home. NOTE: This sheet is a summary. It may not cover all possible information. If you have questions about this medicine, talk to your doctor, pharmacist, or health care provider.  2014, Elsevier/Gold Standard. (2007-06-27 14:38:05) Paclitaxel injection What is  this medicine? PACLITAXEL (PAK li TAX el) is a chemotherapy drug. It targets fast dividing cells, like cancer cells, and causes these cells to die. This medicine is used to treat ovarian cancer, breast cancer, and other cancers. This medicine may be used for other purposes; ask your health care provider or pharmacist if you have questions. COMMON BRAND NAME(S): Onxol , Taxol What should I tell my health care provider before I take this medicine? They need to know if you have any of these conditions: -blood disorders -irregular heartbeat -infection (especially a virus infection such as chickenpox, cold sores, or herpes) -liver disease -previous or ongoing radiation therapy -an unusual or allergic reaction to paclitaxel, alcohol, polyoxyethylated castor oil, other chemotherapy agents, other medicines, foods, dyes, or preservatives -pregnant or trying to get pregnant -breast-feeding How should I use this medicine? This drug is given as an infusion into a vein. It is administered in a hospital or clinic by a specially trained health care professional. Talk to your pediatrician regarding the use of this medicine in children. Special care may be needed. Overdosage: If you think you have taken too much of this medicine contact a poison control center or emergency room at once. NOTE: This medicine is only for you. Do not share this medicine with others. What if I miss a dose? It is important not to miss your dose. Call your doctor or health care professional if you are unable to keep an appointment. What may interact with this medicine? Do not take this medicine with any of the following medications: -disulfiram -metronidazole This medicine may also interact with the following medications: -cyclosporine -diazepam -ketoconazole -medicines to increase blood counts like filgrastim, pegfilgrastim, sargramostim -other chemotherapy drugs like cisplatin, doxorubicin, epirubicin, etoposide, teniposide,  vincristine -quinidine -testosterone -vaccines -verapamil Talk to your doctor or health care professional before taking any of these medicines: -acetaminophen -aspirin -ibuprofen -ketoprofen -naproxen This list may not describe all possible interactions. Give your health care provider a list of all the medicines, herbs, non-prescription drugs, or dietary supplements you use. Also tell them if you smoke, drink alcohol, or use illegal drugs. Some items may interact with your medicine. What should I watch for while using this medicine? Your condition will be monitored carefully while you are receiving this medicine. You will need important blood work done while you are taking this medicine. This drug may make you feel generally unwell. This is not uncommon, as chemotherapy can affect healthy cells as well as cancer cells. Report any side effects. Continue your course of treatment even though you feel ill unless your doctor tells you to stop. In some cases, you may be given additional medicines to help with side effects. Follow all directions for their use. Call your doctor or health care professional for advice if you get a fever, chills or sore throat, or other symptoms of a cold or flu. Do not treat yourself. This drug decreases your body's ability to fight infections. Try to avoid being around people who are sick. This medicine may increase your risk to bruise or bleed. Call your doctor or health care  professional if you notice any unusual bleeding. Be careful brushing and flossing your teeth or using a toothpick because you may get an infection or bleed more easily. If you have any dental work done, tell your dentist you are receiving this medicine. Avoid taking products that contain aspirin, acetaminophen, ibuprofen, naproxen, or ketoprofen unless instructed by your doctor. These medicines may hide a fever. Do not become pregnant while taking this medicine. Women should inform their doctor if  they wish to become pregnant or think they might be pregnant. There is a potential for serious side effects to an unborn child. Talk to your health care professional or pharmacist for more information. Do not breast-feed an infant while taking this medicine. Men are advised not to father a child while receiving this medicine. What side effects may I notice from receiving this medicine? Side effects that you should report to your doctor or health care professional as soon as possible: -allergic reactions like skin rash, itching or hives, swelling of the face, lips, or tongue -low blood counts - This drug may decrease the number of white blood cells, red blood cells and platelets. You may be at increased risk for infections and bleeding. -signs of infection - fever or chills, cough, sore throat, pain or difficulty passing urine -signs of decreased platelets or bleeding - bruising, pinpoint red spots on the skin, black, tarry stools, nosebleeds -signs of decreased red blood cells - unusually weak or tired, fainting spells, lightheadedness -breathing problems -chest pain -high or low blood pressure -mouth sores -nausea and vomiting -pain, swelling, redness or irritation at the injection site -pain, tingling, numbness in the hands or feet -slow or irregular heartbeat -swelling of the ankle, feet, hands Side effects that usually do not require medical attention (report to your doctor or health care professional if they continue or are bothersome): -bone pain -complete hair loss including hair on your head, underarms, pubic hair, eyebrows, and eyelashes -changes in the color of fingernails -diarrhea -loosening of the fingernails -loss of appetite -muscle or joint pain -red flush to skin -sweating This list may not describe all possible side effects. Call your doctor for medical advice about side effects. You may report side effects to FDA at 1-800-FDA-1088. Where should I keep my  medicine? This drug is given in a hospital or clinic and will not be stored at home. NOTE: This sheet is a summary. It may not cover all possible information. If you have questions about this medicine, talk to your doctor, pharmacist, or health care provider.  2014, Elsevier/Gold Standard. (2012-05-15 16:41:21) Implanted Port Insertion An implanted port is a central line that has a round shape and is placed under the skin. It is used as a long-term IV access for:   Medicines, such as chemotherapy.   Fluids.   Liquid nutrition, such as total parenteral nutrition (TPN).   Blood samples.  LET [facility] CARE PROVIDER KNOW ABOUT:  Allergies to food or medicine.   Medicines taken, including vitamins, herbs, eye drops, creams, and over-the-counter medicines.   Any allergies to heparin.  Use of steroids (by mouth or creams).   Previous problems with anesthetics or numbing medicines.   History of bleeding problems or blood clots.   Previous surgery.   Other health problems, including diabetes and kidney problems.   Possibility of pregnancy, if this applies. RISKS AND COMPLICATIONS  Damage to the blood vessel, bruising, or bleeding at the puncture site.   Infection.  Blood clot in the vessel  that the port is in.  Breakdown of the skin over your port.  Very rarely a person may develop a condition called a pneumothorax, a collection of air in the chest that may cause one of the lungs to collapse. The placement of these catheters with the appropriate imaging guidance significantly decreases the risk of a pneumothorax.  BEFORE THE PROCEDURE   Your health care provider may want you to have blood tests. These tests can help tell how well your kidneys and liver are working. They can also show how well your blood clots.   If you take blood thinners (anticoagulant medicines), ask your health care provider when you should stop taking them.   Make arrangements for  someone to drive you home. This is necessary if you have been sedated for your procedure.  PROCEDURE  Port insertion usually takes about 30 45 minutes.   An IV needle will be inserted in your arm. Medicine for pain and medicine to help relax you (sedative) will flow directly into your body through this needle.   You will lie on an exam table, and you will be connected to monitors to keep track of your heart rate, blood pressure, and breathing throughout the procedure.  An oxygen monitoring device may be attached to your finger. Oxygen will be given.   Everything is kept as germ free (sterile) as possible during the procedure. The skin near the point of the incision will be cleaned with antiseptic, and the area will be draped with sterile towels. The skin and deeper tissues over the port area will be made numb with a local anesthetic.  Two small cuts (incisions) will be made in the skin to insert the port. One will be made in the neck to obtain access to the vein where the catheter will lie.   Because the port reservoir is placed under the skin, a small skin incision is made in the upper chest, and a small pocket for the port is made under the skin. The catheter connected to the port tunnels to a large central vein in the chest. A small, raised area remains on your body at the site of the reservoir when the procedure is complete.  The port placement is done under imaging guidance to ensure the proper placement.  The reservoir has a silicone covering that can be punctured with a special needle.   The port is flushed with normal saline, and blood is drawn to make sure it is working properly.  There is nothing remaining outside the skin when the procedure is finished.   Incisions are held together by stitches, surgical glue, or a special tape.  AFTER THE PROCEDURE  You will stay in a recovery area until the anesthesia has worn off. Your blood pressure and pulse will be checked.  A  final chest X-ray is taken to check the placement of the port and that there is no injury to your lung.  If there are no problems, you should be able to go home after the procedure.  Document Released: 01/10/2013 Document Reviewed: 11/27/2012 [facility] Patient Information 2014 [place], [place]. Cancer, Radiation Treatment Radiation therapy uses ionizing beams for killing cancer cells and shrinking tumors. Radiation damages both cancer cells and normal cells, but normal cells have the DNA to repair themselves. Cancer cells do not have DNA to repair themselves and therefore, are killed off by the radiation, and the body disposes of them. The goal of therapy is to kill as many cancer cells without  causing harm to healthy cells near the cancer. Radiation therapy may also be used to reduce pain (palliative therapy).  RADIATION IS USED FOR DIFFERENT REASONS  As the primary or only treatment for the cancer.  Used before surgery to shrink a tumor.  Used after surgery to stop the growth of any remaining cancer cells.  Used in combination with other treatments to kill cancer cells.  Used in advanced cancer patients to help with symptoms of the cancer. RADIATION THERAPY MAY BE EXTERNAL OR INTERNAL  External beam radiation is used most often. It is given on an outpatient basis. This means you do not have to be hospitalized. The energy (source of radiation) used in external radiation therapy may come from X-rays or gamma rays. Although they are produced in different ways, both use packets of energy (photons). Lower energy beams can be used to destroy cancer cells on the surface of the body. Higher energy beams are used to treat cancer deep within the body. Compared with some other types of radiation, x-rays can deliver radiation to a fairly large area.  Internal radiation is called brachytherapy. There is a lose dose rate (LDR) where permanent seeds are implanted into the patient, usually used for GYN or  Prostate cancer. This procedure is an inpatient procedure. High dose rate brachytherapy (HDR) is another type of radiation, which is an outpatient procedure. Needles are inserted into the patient, and the radiation travels through these needles to deliver the dose prescribed. This procedure is mainly used for prostate and breast cancers. Both of these types use a live source of radiation. Radiation therapy may be used to treat almost all types of tumors. It is also used to treat leukemia and lymphoma. There are a few that are more radio resistant and may not respond to radiation alone. These are cancers of the blood-forming cells and lymphatic system.  For some types of cancer, radiation may be given to areas that do not have evidence of cancer. This is done to prevent cancer cells from growing in the area receiving the radiation. This technique is called prophylactic radiation therapy.  RISKS AND COMPLICATIONS Side effects of radiation therapy depend on which part of your body is exposed to radiation and how much radiation is used. Most people are affected by fatigue, which is the most common side effect.  Everybody deals with radiation differently. You cannot predict what the side effects will be. They do not occur right away, and it can take 2-3 weeks to develop side effects. Most side effects are temporary and can be controlled. Once the treatment is complete, the side effects do not stop right away. It can take up to 3-4 weeks to regain your energy or for the redness to go away. Your body does heal from the radiation. Some common side effects are:  Difficulty swallowing, coughing (head and neck cancers and lung cancers).  Feeling sick to your stomach (nauseous), vomiting and/or diarrhea (abdominal area or pelvis being treated).  Bladder problems, frequent urination and/or sexual dysfunction (bladder, kidney, prostate cancer).  Hair loss (brain tumors). BEFORE THE PROCEDURE There will be a  planning simulation usually in the radiation department using a CT planning scan. Your radiation oncologist will plan exactly where the radiation will be delivered. The treatment fields will be planned just for you in order to treat you the best way possible. They also make a plan to avoid critical structures in the body. You may also be given a dye (contrast) during this  CT planning scan or an MRI.  You will be positioned how you will be everyday for your treatment. The goal is to have a position that can be reproduced daily.  Usually, temporary marks will be placed on your body to give the therapists a place to shift from once the plan is complete. Then, tattoos are usually put on you in order for you to line up in the same way each day. These tattoos are permanent, but are the size of a freckle. PROCEDURE  You will lie perfectly still on a table in the position determined for treatment, while the linear accelerator moves around you.  This machine delivers the radiation in exact doses from many possible angles.  Treatments are usually spread out over several weeks. This is to allow your healthy cells to recover between sessions. Usually, treatments are spread out between 5-6 weeks, but this is determined by your radiation oncologist.  There is no pain during this treatment. You will not feel the radiation being delivered. The treatment takes about 15-20 minutes, including setup time. AFTER THE PROCEDURE You will have tests and follow-up appointments. They are usually 6 weeks to 6 months after radiation to see if the treatment helped reduce pain or eliminate the cancer cells. Document Released: 08/08/2008 Document Revised: 06/14/2011 Document Reviewed: 08/08/2008 [facility] Patient Information 2014 [place], [place]. Lung Cancer Lung cancer is an abnormal growth of cells in one or both of your lungs. These extra cells may form a mass of tissue called a growth or tumor. Tumors can be either cancerous  (malignant) or not cancerous (benign).  Lung cancer is the most common cause of cancer death in men and women. There are several different types of lung cancers. Usually, lung cancer is described as either small cell lung cancer or nonsmall cell lung cancer. Other types of cancer occur in the lungs, including carcinoid and cancers spread from other organs. The types of cancer have different behavior and treatment. RISK FACTORS Smoking is the most common risk factor for developing lung cancer. Other risk factors include:  Radon gas exposure.  Asbestos and other industrial substance exposure.  Second hand tobacco smoke.  Air pollution.  Family or personal history of lung cancer.  Age older than 84 years. CAUSES  Lung cancer usually starts when the lungs are exposed to harmful chemicals. Smoking is the most common risk factor for lung cancer. When you quit smoking, your risk of lung cancer falls each year (but is never the same as a person who has never smoked).  SYMPTOMS  Lung cancer may not have any symptoms in its early stages. The symptoms can depend on the type of cancer, its location, and other factors. Symptoms can include:  Cough (either new, different, or more severe).  Shortness of breath.  Coughing up blood (hemoptysis).  Chest pain.  Hoarseness.  Swelling of the face.  Drooping eyelid.  Changes in blood tests, such as low sodium (hyponatremia), high calcium (hypercalcemia), or low blood count (anemia).  Weight loss. DIAGNOSIS  Your health care provider may suspect lung cancer based on your symptoms or based on tests obtained for other reasons. Tests or procedures used to find or confirm the presence of lung cancer may include:  Chest X-ray.  CT scan of the lungs and chest.  Blood tests.  Taking a tissue sample (biopsy) from your lung to look for cancer cells. Your cancer will be staged to determine its severity and extent. Staging is a careful attempt to find  out the size of the tumor, whether the cancer has spread, and if so, to what parts of the body. You may need to have more tests to determine the stage of your cancer. The test results will help determine what treatment plan is best for you.   Stage 0 This is the earliest stage of lung cancer. In this stage the tumor is present in only a few layers of cells and has not grown beyond the inner lining of the lungs. Stage 0 (carcinoma in situ) is considered noninvasive, meaning at this stage it is not yet capable of spreading to other regions.  Stage I The cancer is located only in the lungs and not spread to any lymph nodes.  Stage II The cancer is in the lungs and the nearby lymph nodes.  Stage III The cancer is in the lungs and the lymph nodes in the middle of the chest. This is also called locally advanced disease. This stage has two subtypes:  Stage IIIa  The cancer has spread only to lymph nodes on the same side of the chest where the cancer started.  Stage IIIb  The cancer has spread to lymph nodes on the opposite side of the chest or above the collar bone.  Stage IV This is the most advanced stage of lung cancer and is also called advanced disease. This stage describes when the cancer has spread to both lungs, the fluid in the area around the lungs, or to another body part. Your health care provider may tell you the detailed stage of your cancer, which includes both a number and a letter.  TREATMENT  Depending on the type and stage, lung cancer may be treated with surgery, radiation therapy, chemotherapy, or targeted therapy. Some people have a combination of these therapies. Your treatment plan will be developed by your health care team.  [place] not smoke.  Only take over-the-counter or prescription medicines for pain, discomfort, or fever as directed by your health care provider.  Maintain a healthy diet.  Consider joining a support group. This may help you learn  to cope with the stress of having lung cancer.  Seek advice to help you manage treatment side effects.  Keep all follow-up appointments as directed by your health care provider.  Inform your cancer specialist if you are admitted to the hospital. [place] IF:   You are losing weight without trying.  You have a persistent cough.  You feel short of breath.  You tire easily. SEEK IMMEDIATE MEDICAL CARE IF:   You cough up clotted blood or bright red blood.  Your pain is not manageable or controlled by medicine.  You develop new difficulty breathing or chest pain.  You develop swelling in one or both ankles or legs, or swelling in your face or neck.  You develop headache or confusion. Document Released: 06/28/2000 Document Revised: 01/10/2013 Document Reviewed: 11/08/2012 [facility] Patient Information 2014 [place], [place].

## 2013-05-17 NOTE — Progress Notes (Signed)
[place] A. Barnet Glasgow, M.D.  NEW PATIENT EVALUATION   Name: Heather Coffey Date: 05/18/2013 MRN: [id number] DOB: 15-Jul-1960  PCP: Glo Herring., MD   REFERRING PHYSICIAN: Redmond School, MD  REASON FOR REFERRAL: Newly diagnosed lung cancer.    HISTORY OF PRESENT ILLNESS:Heather Coffey is a 53 y.o. female who is referred by her family physician and cardiothoracic surgeon for recommendations regarding the management of squamous cell carcinoma lung. She suffered with a persistent cough and shortness of breath for about 6 weeks before undergoing chest x-ray at which time a left-sided hilar mass was noted in addition to remnants of her previous right lung abscess. She was referred to Dr. Roxan Hockey who performed bronchoscopy with washings at which time a left lower lobe biopsy was consistent with squamous cell carcinoma. Subsequent PET scan was done which revealed evidence of mediastinal involvement. Patient has had minimal weight loss but with chronic cough. She experienced minimal hemoptysis after bronchoscopy. She denies any lower extremity swelling or redness, chest pain except with exertion. 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   PAST MEDICAL HISTORY:  has a past medical history of Arthritis; Anxiety; Hyperlipidemia; Tobacco abuse; Shortness of breath; Depression; GERD (gastroesophageal reflux disease); H/O hiatal hernia; and Lung cancer.     PAST SURGICAL HISTORY: Past Surgical History  Procedure Laterality Date   Tubal ligation     Breast lumpectomy      Hx: of   Video bronchoscopy with endobronchial ultrasound N/A 05/03/2013    Procedure: VIDEO BRONCHOSCOPY WITH ENDOBRONCHIAL ULTRASOUND;  Surgeon: Melrose Nakayama, MD;  Location: [facility];  Service: Thoracic;  Laterality: N/A;   Abdominal hysterectomy      still has ovaries per pt   Nasal septum surgery       CURRENT MEDICATIONS: has a current medication list which includes the following prescription(s): alprazolam, aspirin ec, esomeprazole, multivitamin, pravastatin, sucralfate, albuterol, cyclobenzaprine, fluoxetine, fluticasone, fluticasone-salmeterol, montelukast, and triamcinolone cream.   ALLERGIES: Augmentin; Codeine; and Tetracyclines & related   SOCIAL HISTORY:  reports that she has been smoking Cigarettes.  She has a 52.5 pack-year smoking history. She has never used smokeless tobacco. She reports that she does not drink alcohol or use illicit drugs.   FAMILY HISTORY: family history includes COPD in her brother, father, mother, and sister; Cancer in her sister; Diabetes in her sister; Hypercholesterolemia in her mother.    REVIEW OF SYSTEMS:  Other than that discussed above is noncontributory.    PHYSICAL EXAM:  height is 5' 5"  (1.651 m) and weight is 129 lb 6.4 oz (58.695 kg). Her oral temperature is 98.2 F (36.8 C). Her blood pressure is 124/68 and her pulse is 85. Her respiration is 20 and oxygen saturation is 98%.    GENERAL:alert, no distress and comfortable SKIN: skin color, texture, turgor are normal, no rashes or significant lesions EYES: normal, Conjunctiva are pink and non-injected, sclera clear OROPHARYNX:no exudate, no erythema and lips, buccal mucosa, and tongue normal  NECK: supple, thyroid normal size, non-tender, without nodularity CHEST: Increased AP diameter with no breast masses. Breasts are slightly diminished on the left. No dullness to percussion. LYMPH:  no palpable lymphadenopathy in the cervical, axillary or inguinal LUNGS: clear to auscultation and percussion with normal breathing effort HEART:  regular rate & rhythm and no murmurs ABDOMEN:abdomen soft, non-tender and normal bowel  sounds MUSCULOSKELETALl:no cyanosis of digits, no clubbing or edema  NEURO: alert & oriented x 3 with fluent speech, no focal motor/sensory deficits    LABORATORY DATA:  Office Visit on 05/17/2013  Component Date Value Ref Range Status   WBC 05/17/2013 8.3  4.0 - 10.5 K/uL Final   RBC 05/17/2013 4.60  3.87 - 5.11 MIL/uL Final   Hemoglobin 05/17/2013 13.8  12.0 - 15.0 g/dL Final   HCT 05/17/2013 41.8  36.0 - 46.0 % Final   MCV 05/17/2013 90.9  78.0 - 100.0 fL Final   MCH 05/17/2013 30.0  26.0 - 34.0 pg Final   MCHC 05/17/2013 33.0  30.0 - 36.0 g/dL Final   RDW 05/17/2013 15.3  11.5 - 15.5 % Final   Platelets 05/17/2013 429* 150 - 400 K/uL Final   Neutrophils Relative % 05/17/2013 61  43 - 77 % Final   Neutro Abs 05/17/2013 5.1  1.7 - 7.7 K/uL Final   Lymphocytes Relative 05/17/2013 28  12 - 46 % Final   Lymphs Abs 05/17/2013 2.3  0.7 - 4.0 K/uL Final   Monocytes Relative 05/17/2013 11  3 - 12 % Final   Monocytes Absolute 05/17/2013 0.9  0.1 - 1.0 K/uL Final   Eosinophils Relative 05/17/2013 1  0 - 5 % Final   Eosinophils Absolute 05/17/2013 0.1  0.0 - 0.7 K/uL Final   Basophils Relative 05/17/2013 1  0 - 1 % Final   Basophils Absolute 05/17/2013 0.1  0.0 - 0.1 K/uL Final   Sodium 05/17/2013 143  137 - 147 mEq/L Final   Potassium 05/17/2013 4.0  3.7 - 5.3 mEq/L Final   Chloride 05/17/2013 100  96 - 112 mEq/L Final   CO2 05/17/2013 28  19 - 32 mEq/L Final   Glucose, Bld 05/17/2013 93  70 - 99 mg/dL Final   BUN 05/17/2013 22  6 - 23 mg/dL Final   Creatinine, Ser 05/17/2013 0.84  0.50 - 1.10 mg/dL Final   Calcium 05/17/2013 9.9  8.4 - 10.5 mg/dL Final   Total Protein 05/17/2013 8.1  6.0 - 8.3 g/dL Final   Albumin 05/17/2013 3.8  3.5 - 5.2 g/dL Final   AST 05/17/2013 18  0 - 37 U/L Final   ALT 05/17/2013 10  0 - 35 U/L Final   Alkaline Phosphatase 05/17/2013 103  39 - 117 U/L Final   Total Bilirubin 05/17/2013 <0.2* 0.3 - 1.2 mg/dL Final   GFR calc  non Af Amer 05/17/2013 80* >90 mL/min Final   GFR calc Af Amer 05/17/2013 >90  >90 mL/min Final   Comment: (NOTE)                          The eGFR has been calculated using the CKD EPI equation.                          This calculation has not been validated in all clinical situations.                          eGFR's persistently <90 mL/min signify possible Chronic Kidney                          Disease.   LDH 05/17/2013 227  94 - 250 U/L Final  Admission on 05/03/2013, Discharged on 05/03/2013  Component Date Value Ref Range Status   aPTT 05/03/2013 35  24 - 37 seconds Final   WBC 05/03/2013 7.6  4.0 - 10.5 K/uL Final   RBC 05/03/2013 4.31  3.87 - 5.11 MIL/uL Final   Hemoglobin 05/03/2013 13.0  12.0 - 15.0 g/dL Final   HCT 05/03/2013 39.1  36.0 - 46.0 % Final   MCV 05/03/2013 90.7  78.0 - 100.0 fL Final   MCH 05/03/2013 30.2  26.0 - 34.0 pg Final   MCHC 05/03/2013 33.2  30.0 - 36.0 g/dL Final   RDW 05/03/2013 15.0  11.5 - 15.5 % Final   Platelets 05/03/2013 336  150 - 400 K/uL Final   Sodium 05/03/2013 145  137 - 147 mEq/L Final   Potassium 05/03/2013 4.1  3.7 - 5.3 mEq/L Final   Chloride 05/03/2013 107  96 - 112 mEq/L Final   CO2 05/03/2013 23  19 - 32 mEq/L Final   Glucose, Bld 05/03/2013 74  70 - 99 mg/dL Final   BUN 05/03/2013 15  6 - 23 mg/dL Final   Creatinine, Ser 05/03/2013 0.69  0.50 - 1.10 mg/dL Final   Calcium 05/03/2013 9.1  8.4 - 10.5 mg/dL Final   Total Protein 05/03/2013 6.6  6.0 - 8.3 g/dL Final   Albumin 05/03/2013 3.1* 3.5 - 5.2 g/dL Final   AST 05/03/2013 15  0 - 37 U/L Final   ALT 05/03/2013 8  0 - 35 U/L Final   Alkaline Phosphatase 05/03/2013 83  39 - 117 U/L Final   Total Bilirubin 05/03/2013 <0.2* 0.3 - 1.2 mg/dL Final   GFR calc non Af Amer 05/03/2013 >90  >90 mL/min Final   GFR calc Af Amer 05/03/2013 >90  >90 mL/min Final   Comment: (NOTE)                          The eGFR has been calculated using the CKD EPI equation.                           This calculation has not been validated in all clinical situations.                          eGFR's persistently <90 mL/min signify possible Chronic Kidney                          Disease.   Prothrombin Time 05/03/2013 13.6  11.6 - 15.2 seconds Final   INR 05/03/2013 1.06  0.00 - 1.49 Final  Hospital Outpatient Visit on 05/01/2013  Component Date Value Ref Range Status   Glucose-Capillary 05/01/2013 83  70 - 99 mg/dL Final    Urinalysis No results found for this basename: colorurine,  appearanceur,  labspec,  phurine,  glucoseu,  hgbur,  bilirubinur,  ketonesur,  proteinur,  urobilinogen,  nitrite,  leukocytesur      @RADIOGRAPHY :   CT Chest W Contrast Status: Final result         PACS Images    Show images for CT Chest W Contrast         Study Result    CLINICAL DATA: Cough and Mid chest pain. Smoker. Left hilar soft  tissue fullness on CT.  EXAM:  CT CHEST WITH CONTRAST  TECHNIQUE:  Multidetector CT imaging of the chest was performed  during  intravenous contrast administration.  CONTRAST: 38m OMNIPAQUE IOHEXOL 300 MG/ML SOLN  COMPARISON: DG CHEST 2 VIEW dated 04/12/2013; DG CHEST 2 VIEW dated  09/15/2011; CT CHEST W/CM dated 11/25/2009  FINDINGS:  Lungs/Pleura: Lingular endobronchial obstruction. Mass effect upon  the left upper lobe bronchus.  Mild biapical pleural parenchymal scarring.  Right upper lobe interstitial opacity on image 21/series 3 is likely  scarring from prior cavitary necrotic pneumonia.  Central lingular irregular lung mass which measures 3.1 x 2.6 cm on  transverse images 28-29. 2.5 cm on sagittal image 72. Contacts the  left major fissure.  No pleural fluid.  Heart/Mediastinum: No supraclavicular adenopathy. Age advanced  aortic and branch vessel atherosclerosis. Normal heart size, without  pericardial effusion. No central pulmonary embolism, on this  non-dedicated study.  Necrotic prevascular adenopathy at  2.3 x 1.4 cm on image 20.  Contiguous AP window adenopathy at 2.6 x 3.7 cm on image 24/series  2. This is associated intimately with the descending aorta and left  pulmonary artery.  No hilar adenopathy, separate from the infrahilar mass.  Fluid level in the thoracic esophagus on image 18.  Upper Abdomen: Normal adrenal glands. Probable atrophy in the upper  pole right kidney.  Bones/Musculoskeletal: No acute osseous abnormality.  IMPRESSION:  1. Plain film abnormality corresponds to a central lingular lung  mass, most consistent with primary bronchogenic carcinoma.  2. Adenopathy within the prevascular and AP window stations with  direct extension into the left-sided mediastinum. The AP window  adenopathy is intimately associated with the descending thoracic  aorta and left pulmonary artery. These results will be called to the  ordering clinician or representative by the Radiologist Assistant,  and communication documented in the PACS Dashboard.  3. These results will be called to the ordering clinician or  representative by the Radiologist Assistant, and communication  documented in the PACS Dashboard.  Electronically Signed  By: Heather MiyamotoM.D.  On: 04/16/2013 16       Dg Chest 2 View Within Previous 72 Hours.  Films Obtained On Friday Are Acceptable For Monday And Tuesday Cases  05/03/2013   CLINICAL DATA:  Dyspnea, tobacco use  EXAM: CHEST  2 VIEW  COMPARISON:  NM PET IMAGE INITIAL (PI) SKULL BASE TO THIGH dated 05/01/2013; DG CHEST 2 VIEW dated 04/12/2013  FINDINGS: Again demonstrated is a lobulated left hilar mass. The right hilum is normal in appearance. The lungs are adequately inflated. There is density adjacent to the cardiac apex which is not new but is more conspicuous today. It corresponds to a lingular density seen on the previous PET-CT study. The cardiac silhouette is normal in size. The pulmonary vascularity is not engorged. The mediastinum is normal in width. There is  no pleural effusion or pneumothorax.  IMPRESSION: 1. There is persistent left hilar mass. There is density adjacent to the cardiac apex which may reflect atelectasis. 2. There is no evidence of pneumonia nor CHF.   Electronically Signed   By: Heather Coffey  On: 05/03/2013 12:21   Nm Pet Image Initial (pi) Skull Base To Thigh  05/02/2013   CLINICAL DATA:  Initial treatment strategy for lung mass.  EXAM: NUCLEAR MEDICINE PET SKULL BASE TO THIGH  FASTING BLOOD GLUCOSE:  Value: [id number]  TECHNIQUE: 16.3 mCi F-18 FDG was injected intravenously. CT data was obtained and used for attenuation correction and anatomic localization only. (This was not acquired as a diagnostic CT examination.) Additional exam technical data entered on technologist worksheet.  COMPARISON:  CT CHEST W/CM dated 04/16/2013  FINDINGS: NECK  No hypermetabolic lymph nodes in the neck. CT images show mucosal thickening and small air-fluid levels in the maxillary sinuses.  CHEST  Low left internal jugular lymph nodes measure up to 6 mm in short axis (CT image 59) and may be mildly hypermetabolic, with an SUV max of 2.5 (PET image 59). Prevascular and AP window adenopathy is contiguous with AP window adenopathy, better measured on 04/16/2013 (2.6 x 3.7 cm on that study), and an SUV max of 14.4. Additional hypermetabolic lymph nodes are seen in the lower left paratracheal station, posterior to the left mainstem bronchus and posterior to the descending thoracic aorta (PET images 82 and 92). Lymph node posterior to the descending thoracic aorta measures 7 mm in short axis (image 92)  Left hilar mass was better measured on 04/16/2013 (2.6 x 3.1 cm on that study) with an SUV max of 13.8. No additional areas of abnormal hypermetabolism in the chest.  CT images show no intervening acute findings. Specifically, no pericardial or pleural effusion. Coronary artery calcification  ABDOMEN/PELVIS  No abnormal hypermetabolism in the liver, adrenal glands, spleen or  pancreas. No hypermetabolic lymph nodes. CT images show the liver to be grossly unremarkable. Small stones appear to layer in the gallbladder. Adrenal glands, kidneys, spleen, pancreas, stomach and bowel are grossly unremarkable. Atherosclerotic calcification of the arterial vasculature without abdominal aortic aneurysm.  SKELETON  No hypermetabolic osseous lesions. Degenerative changes are seen in the spine.  IMPRESSION: 1. Hypermetabolic left hilar mass with hypermetabolic ipsilateral mediastinal adenopathy. Findings are most consistent with primary bronchogenic carcinoma and at least T1b N2 M0 or stage IIIA disease. Questionable left supraclavicular nodal metastasis would upstage the patient to stage IIIB disease. 2. Extensive coronary artery calcification. 3. Cholelithiasis. 4. Small air-fluid levels in the maxillary sinuses.   Electronically Signed   By: Heather Coffey M.D.   On: 05/02/2013 09:49    PATHOLOGY: for Heather Coffey, Heather Coffey ([id number]) Patient: Heather Coffey, Heather Coffey Collected: 05/03/2013 Client: [place] Accession: [id number] Received: 05/03/2013 Heather Coffey DOB: April 23, 1960 Age: 64 Gender: F Reported: 05/07/2013 [address] Patient Ph: [phone] MRN #: [id number] [place], [place] [address] Visit #: [id number] Chart #: Phone:  Fax: CC: REPORT OF SURGICAL PATHOLOGY FINAL DIAGNOSIS Diagnosis Bronchus, biopsy, Left upper lobe - POSITIVE FOR SQUAMOUS CELL CARCINOMA. - SEE COMMENT. Microscopic Comment Immunohistochemical stains are performed. The tumor is positive for cytokeratin 5/6 while it is negative for TTF-1. The morphology coupled with the staining pattern is compatible with the above diagnosis. The findings are called to Dr. Roxan Hockey on 05/07/2013. Dr. Donato Heinz has seen this case in consultation with agreement of the above diagnosis. Willeen Niece MD Pathologist, Electronic Signature (Case signed 05/07/2013) Specimen Gross and Clinical Information Specimen(s)  Obtained: Bronchus, biopsy, Left upper lobe Specimen Clinical Information Left ung mass, mediastinal adenopathy (tl) Gross Received fresh are fragments of soft tan red tissue having an aggregate measurement of 0.5 x 0.4 x 0.2 cm. The specimen is submitted in toto. (GRP:kh 05-03-13) Stain(s) used in Diagnosis: The following stain(s) were used in diagnosing the case: CK 5/6, Thyroid Transcription Factor -1. The control(s) stained appropriately. 1 of 2 FINAL for Heather Coffey, Heather Coffey ([id number]) Disclaimer Some of these immunohistochemical stains may have been developed and the performance characteristics determined by [facility]. Some may not have been cleared or approved by the U.S. Food and Drug Administration. The FDA has determined that such clearance or approval is  not necessary. This test is used for clinical purposes. It should not be regarded as investigational or for research. This laboratory is certified under the [place] (CLIA-88) as qualified to perform high complexity clinical laboratory testing. Report signed out from the following location(s) Technical Component performed at [facility],[place] [address].[id number]., Technical Component performed at [facility] [address], [place],  [address] [place] [id number]??, Interpretation performed at [place]:  #1. 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 #2. Chronic obstructive pulmonary disease, still smoking. #3. Cholelithiasis, asymptomatic. #4. History of right lung abscess, status post medical treatment only. This occurred about 3 or 4 years ago and required hospitalization and intravenous antibiotics. There was no antecedent history of aspiration or loss of consciousness. #5. Gastroesophageal reflux disease    PLAN:  #1. A  discussion was held with the patient and her husband who accompanied her today. Optimal management for stage III squamous cell lung cancer involves combined modality intervention with daily radiotherapy in conjunction with appropriate chemotherapy. #2. With this in mind, the patient was referred to interventional radiology for insertion of a LifePort. #3. Referral to Dr. Orlene Erm for consideration of radiotherapy planning and institution of treatment. #4. An appointment will be made with our nurse navigator for discussion in detail of carboplatin and Taxol which will be given weekly in conjunction with external beam radiotherapy. #5. Followup in 3 weeks for institution of therapy depending upon radiotherapy scheduling.  I appreciate the opportunity of sharing in her care.   Doroteo Bradford, MD 05/18/2013 6:43 AM

## 2013-05-18 ENCOUNTER — Encounter (HOSPITAL_COMMUNITY): Payer: Self-pay | Admitting: *Deleted

## 2013-05-18 ENCOUNTER — Other Ambulatory Visit (HOSPITAL_COMMUNITY): Payer: Self-pay | Admitting: Hematology and Oncology

## 2013-05-20 MED ORDER — VANCOMYCIN HCL IN DEXTROSE 1-5 GM/200ML-% IV SOLN
1000.0000 mg | INTRAVENOUS | Status: AC
  Administered 2013-05-21: 1000 mg via INTRAVENOUS
  Filled 2013-05-20: qty 200

## 2013-05-21 ENCOUNTER — Encounter (HOSPITAL_COMMUNITY): Payer: Managed Care, Other (non HMO) | Admitting: Anesthesiology

## 2013-05-21 ENCOUNTER — Ambulatory Visit (HOSPITAL_COMMUNITY): Payer: Managed Care, Other (non HMO)

## 2013-05-21 ENCOUNTER — Ambulatory Visit (HOSPITAL_COMMUNITY): Payer: Managed Care, Other (non HMO) | Admitting: Anesthesiology

## 2013-05-21 ENCOUNTER — Encounter (HOSPITAL_COMMUNITY): Payer: Self-pay | Admitting: Certified Registered"

## 2013-05-21 ENCOUNTER — Encounter (HOSPITAL_COMMUNITY)
Admission: RE | Disposition: A | Discharge status: Home / Self Care | Payer: Self-pay | Source: Ambulatory Visit | Attending: Thoracic Surgery (Cardiothoracic Vascular Surgery)

## 2013-05-21 ENCOUNTER — Ambulatory Visit (HOSPITAL_COMMUNITY)
Admission: RE | Admit: 2013-05-21 | Discharge: 2013-05-21 | Disposition: A | Discharge status: Home / Self Care | Payer: Managed Care, Other (non HMO) | Source: Ambulatory Visit | Attending: Thoracic Surgery (Cardiothoracic Vascular Surgery) | Admitting: Thoracic Surgery (Cardiothoracic Vascular Surgery)

## 2013-05-21 DIAGNOSIS — F411 Generalized anxiety disorder: Secondary | ICD-10-CM | POA: Insufficient documentation

## 2013-05-21 DIAGNOSIS — E785 Hyperlipidemia, unspecified: Secondary | ICD-10-CM | POA: Insufficient documentation

## 2013-05-21 DIAGNOSIS — R599 Enlarged lymph nodes, unspecified: Secondary | ICD-10-CM | POA: Insufficient documentation

## 2013-05-21 DIAGNOSIS — C3490 Malignant neoplasm of unspecified part of unspecified bronchus or lung: Secondary | ICD-10-CM | POA: Insufficient documentation

## 2013-05-21 DIAGNOSIS — C3410 Malignant neoplasm of upper lobe, unspecified bronchus or lung: Secondary | ICD-10-CM

## 2013-05-21 DIAGNOSIS — Z7982 Long term (current) use of aspirin: Secondary | ICD-10-CM | POA: Insufficient documentation

## 2013-05-21 DIAGNOSIS — Z87891 Personal history of nicotine dependence: Secondary | ICD-10-CM | POA: Insufficient documentation

## 2013-05-21 HISTORY — PX: PORTACATH PLACEMENT: SHX2246

## 2013-05-21 LAB — CBC
HCT: 38.8 % (ref 36.0–46.0)
Hemoglobin: 12.9 g/dL (ref 12.0–15.0)
MCH: 30.1 pg (ref 26.0–34.0)
MCHC: 33.2 g/dL (ref 30.0–36.0)
MCV: 90.4 fL (ref 78.0–100.0)
Platelets: 389 10*3/uL (ref 150–400)
RBC: 4.29 MIL/uL (ref 3.87–5.11)
RDW: 15.9 % — AB (ref 11.5–15.5)
WBC: 10.1 10*3/uL (ref 4.0–10.5)

## 2013-05-21 LAB — COMPREHENSIVE METABOLIC PANEL
ALBUMIN: 3.4 g/dL — AB (ref 3.5–5.2)
ALT: 10 U/L (ref 0–35)
AST: 21 U/L (ref 0–37)
Alkaline Phosphatase: 89 U/L (ref 39–117)
BUN: 24 mg/dL — ABNORMAL HIGH (ref 6–23)
CO2: 21 mEq/L (ref 19–32)
CREATININE: 0.71 mg/dL (ref 0.50–1.10)
Calcium: 9.1 mg/dL (ref 8.4–10.5)
Chloride: 103 mEq/L (ref 96–112)
GFR calc Af Amer: >90 mL/min (ref >90)
GFR calc non Af Amer: >90 mL/min (ref >90)
Glucose, Bld: 77 mg/dL (ref 70–99)
Potassium: 4.2 mEq/L (ref 3.7–5.3)
SODIUM: 139 meq/L (ref 137–147)
Total Bilirubin: <0.2 mg/dL — ABNORMAL LOW (ref 0.3–1.2)
Total Protein: 7.3 g/dL (ref 6.0–8.3)

## 2013-05-21 LAB — SURGICAL PCR SCREEN
MRSA, PCR: POSITIVE — AB
STAPHYLOCOCCUS AUREUS: POSITIVE — AB

## 2013-05-21 LAB — PROTIME-INR
INR: 1.03 (ref 0.00–1.49)
Prothrombin Time: 13.3 seconds (ref 11.6–15.2)

## 2013-05-21 LAB — APTT: aPTT: 37 seconds (ref 24–37)

## 2013-05-21 SURGERY — INSERTION, TUNNELED CENTRAL VENOUS DEVICE, WITH PORT
Anesthesia: Monitor Anesthesia Care | Laterality: Left

## 2013-05-21 MED ORDER — LIDOCAINE HCL (CARDIAC) 20 MG/ML IV SOLN
INTRAVENOUS | Status: AC
  Filled 2013-05-21: qty 5

## 2013-05-21 MED ORDER — LIDOCAINE-PRILOCAINE 2.5-2.5 % EX CREA: TOPICAL_CREAM | CUTANEOUS | Status: AC

## 2013-05-21 MED ORDER — HYDROMORPHONE HCL PF 1 MG/ML IJ SOLN: 0.2500 mg | INTRAMUSCULAR | Status: DC | PRN

## 2013-05-21 MED ORDER — LACTATED RINGERS IV SOLN
INTRAVENOUS | Status: DC
  Administered 2013-05-21: 50 mL/h via INTRAVENOUS

## 2013-05-21 MED ORDER — PROPOFOL 10 MG/ML IV BOLUS
INTRAVENOUS | Status: DC | PRN
  Administered 2013-05-21 (×3): 20 mg via INTRAVENOUS

## 2013-05-21 MED ORDER — ONDANSETRON HCL 4 MG/2ML IJ SOLN
INTRAMUSCULAR | Status: DC | PRN
  Administered 2013-05-21: 4 mg via INTRAVENOUS

## 2013-05-21 MED ORDER — HEPARIN SODIUM (PORCINE) 1000 UNIT/ML IJ SOLN
INTRAMUSCULAR | Status: AC
  Filled 2013-05-21: qty 1

## 2013-05-21 MED ORDER — TRAMADOL HCL 50 MG PO TABS
50.0000 mg | ORAL_TABLET | Freq: Four times a day (QID) | ORAL | Status: DC | PRN
Start: 2013-05-21 — End: 2013-08-02

## 2013-05-21 MED ORDER — OXYCODONE HCL 5 MG/5ML PO SOLN: 5.0000 mg | Freq: Once | ORAL | Status: DC | PRN

## 2013-05-21 MED ORDER — PROPOFOL 10 MG/ML IV BOLUS
INTRAVENOUS | Status: AC
  Filled 2013-05-21: qty 20

## 2013-05-21 MED ORDER — METOCLOPRAMIDE HCL 5 MG PO TABS: ORAL_TABLET | ORAL | Status: DC

## 2013-05-21 MED ORDER — HEPARIN SODIUM (PORCINE) 1000 UNIT/ML IJ SOLN
INTRAMUSCULAR | Status: DC | PRN
  Administered 2013-05-21: 2000 [IU] via INTRAVENOUS

## 2013-05-21 MED ORDER — FENTANYL CITRATE 0.05 MG/ML IJ SOLN
INTRAMUSCULAR | Status: AC
  Filled 2013-05-21: qty 5

## 2013-05-21 MED ORDER — FENTANYL CITRATE 0.05 MG/ML IJ SOLN
INTRAMUSCULAR | Status: DC | PRN
  Administered 2013-05-21: 50 ug via INTRAVENOUS

## 2013-05-21 MED ORDER — MIDAZOLAM HCL 5 MG/5ML IJ SOLN
INTRAMUSCULAR | Status: DC | PRN
  Administered 2013-05-21: 2 mg via INTRAVENOUS

## 2013-05-21 MED ORDER — LIDOCAINE HCL (PF) 1 % IJ SOLN
INTRAMUSCULAR | Status: AC
  Filled 2013-05-21: qty 30

## 2013-05-21 MED ORDER — LIDOCAINE HCL 1 % IJ SOLN
INTRAMUSCULAR | Status: DC | PRN
  Administered 2013-05-21: 19 mL via INTRADERMAL

## 2013-05-21 MED ORDER — PROCHLORPERAZINE MALEATE 10 MG PO TABS: ORAL_TABLET | ORAL | Status: DC

## 2013-05-21 MED ORDER — LIDOCAINE HCL (CARDIAC) 20 MG/ML IV SOLN
INTRAVENOUS | Status: DC | PRN
  Administered 2013-05-21: 40 mg via INTRAVENOUS

## 2013-05-21 MED ORDER — ONDANSETRON HCL 4 MG/2ML IJ SOLN
INTRAMUSCULAR | Status: AC
  Filled 2013-05-21: qty 2

## 2013-05-21 MED ORDER — ONDANSETRON HCL 4 MG/2ML IJ SOLN: 4.0000 mg | Freq: Once | INTRAMUSCULAR | Status: DC | PRN

## 2013-05-21 MED ORDER — SODIUM CHLORIDE 0.9 % IR SOLN
Status: DC | PRN
  Administered 2013-05-21: 09:00:00

## 2013-05-21 MED ORDER — MUPIROCIN 2 % EX OINT
TOPICAL_OINTMENT | CUTANEOUS | Status: AC
  Administered 2013-05-21: 1 via NASAL
  Filled 2013-05-21: qty 22

## 2013-05-21 MED ORDER — MIDAZOLAM HCL 2 MG/2ML IJ SOLN
INTRAMUSCULAR | Status: AC
  Filled 2013-05-21: qty 2

## 2013-05-21 MED ORDER — OXYCODONE HCL 5 MG PO TABS: 5.0000 mg | ORAL_TABLET | Freq: Once | ORAL | Status: DC | PRN

## 2013-05-21 SURGICAL SUPPLY — 35 items
BAG DECANTER FOR FLEXI CONT (MISCELLANEOUS) ×3 IMPLANT
CANISTER SUCTION 2500CC (MISCELLANEOUS) ×3 IMPLANT
COVER SURGICAL LIGHT HANDLE (MISCELLANEOUS) ×6 IMPLANT
DERMABOND ADVANCED (GAUZE/BANDAGES/DRESSINGS) ×2
DERMABOND ADVANCED .7 DNX12 (GAUZE/BANDAGES/DRESSINGS) ×1 IMPLANT
DRAPE C-ARM 42X72 X-RAY (DRAPES) ×3 IMPLANT
DRAPE CHEST BREAST 15X10 FENES (DRAPES) ×3 IMPLANT
ELECT REM PT RETURN 9FT ADLT (ELECTROSURGICAL) ×3
ELECTRODE REM PT RTRN 9FT ADLT (ELECTROSURGICAL) ×1 IMPLANT
GLOVE SURG SIGNA 7.5 PF LTX (GLOVE) ×3 IMPLANT
GOWN PREVENTION PLUS XLARGE (GOWN DISPOSABLE) ×3 IMPLANT
GOWN STRL NON-REIN LRG LVL3 (GOWN DISPOSABLE) ×3 IMPLANT
GUIDEWIRE UNCOATED ST S 7038 (WIRE) IMPLANT
INTRODUCER 13FR (MISCELLANEOUS) IMPLANT
INTRODUCER COOK 11FR (CATHETERS) IMPLANT
KIT BASIN OR (CUSTOM PROCEDURE TRAY) ×3 IMPLANT
KIT PORT POWER 9.6FR MRI PREA (Catheter) ×3 IMPLANT
KIT PORT POWER ISP 8FR (Catheter) IMPLANT
KIT POWER CATH 8FR (Catheter) IMPLANT
KIT ROOM TURNOVER OR (KITS) ×3 IMPLANT
NEEDLE 18GX1X1/2 (RX/OR ONLY) (NEEDLE) ×3 IMPLANT
NEEDLE 22X1 1/2 (OR ONLY) (NEEDLE) ×3 IMPLANT
NS IRRIG 1000ML POUR BTL (IV SOLUTION) ×3 IMPLANT
PACK GENERAL/GYN (CUSTOM PROCEDURE TRAY) ×3 IMPLANT
PAD ARMBOARD 7.5X6 YLW CONV (MISCELLANEOUS) ×6 IMPLANT
SET SHEATH INTRODUCER 10FR (MISCELLANEOUS) IMPLANT
SUT MNCRL AB 4-0 PS2 18 (SUTURE) ×3 IMPLANT
SUT VIC AB 3-0 SH 27 (SUTURE) ×6
SUT VIC AB 3-0 SH 27X BRD (SUTURE) ×3 IMPLANT
SYR 20CC LL (SYRINGE) ×3 IMPLANT
SYR CONTROL 10ML LL (SYRINGE) ×3 IMPLANT
SYRINGE 10CC LL (SYRINGE) ×3 IMPLANT
TOWEL OR 17X24 6PK STRL BLUE (TOWEL DISPOSABLE) ×3 IMPLANT
TOWEL OR 17X26 10 PK STRL BLUE (TOWEL DISPOSABLE) ×3 IMPLANT
WATER STERILE IRR 1000ML POUR (IV SOLUTION) ×3 IMPLANT

## 2013-05-21 NOTE — Anesthesia Procedure Notes (Signed)
Procedure Name: MAC Date/Time: 05/21/2013 8:49 AM Performed by: Babs Bertin Pre-anesthesia Checklist: Patient identified, Emergency Drugs available, Suction available, Patient being monitored and Timeout performed Patient Re-evaluated:Patient Re-evaluated prior to inductionOxygen Delivery Method: Simple face mask Intubation Type: IV induction

## 2013-05-21 NOTE — Patient Instructions (Addendum)
[place]   CHEMOTHERAPY INSTRUCTIONS  Taxol - the first time you receive this drug we will titrate it very slowly to ensure that you do not have or are not having an allergic reaction to the chemo. Side Effects: hair loss, lowers your white blood cells (fight infection), muscle aches, nausea/vomiting, irritation to the mouth (mouth sores, pain in your mouth) *neuropathy - numbness/tingling/burning in hands/fingers/feet/toes. We need to know as soon as this begins to happen so that we can monitor it and treat if necessary. The numbness generally begins in the fingertips of tips of toes and then begins to travel up the finger/toe/hand/foot. We never want you getting to where you can't pick up a pen, coin, zip a zipper, button a button, or have trouble walking. You must tell us immediately if you are experiencing peripheral neuropathy!   Carboplatin - this medication can be hard on your kidneys - this is why we need you to drink 64 oz of fluid (preferably water/decaff fluids) 2 days prior to chemo and for up to 4-5 days after chemo. Drink more if you can. This will help to keep your kidneys flushed. This can cause mild hair loss, lower your platelets (which make your blood clot), lower your white blood cells (fight infection), and cause nausea/vomiting.    The medications that you receive before chemo are Benadryl (to reduce the risk of allergic reaction to Taxol), Pepcid (to reduce the risk of allergic reaction to Taxol), Dexamethasone (steroid - to also reduce the risk of an allergic reaction to the Taxol), and Zofran (to prevent/reduce the severity of nausea).   Dexamethasone can make you feel jittery or nervous. It may even make it hard for you to sleep the night after receiving chemo. It may also turn you red or make you feel flushed in the face, neck, and chest area.   POTENTIAL SIDE EFFECTS OF TREATMENT: Increased Susceptibility to Infection, Vomiting,  Constipation, Hair Thinning, Changes in Character of Skin and Nails (brittleness, dryness,etc.), Bone Marrow Suppression, Abdominal Cramping, Urinary Frequency, Complete Hair Loss, Nausea, Diarrhea, Sun Sensitivity and Mouth Sores   SELF IMAGE NEEDS AND REFERRALS MADE: Obtain hair accessories as soon as possible (wigs, scarves, turbans,caps,etc.)  Referral to Look Good, Feel Better consultant   EDUCATIONAL MATERIALS GIVEN AND REVIEWED: Chemotherapy and You  Specific Instructions Sheets: Taxol, Carboplatin, Metoclopramide, Prochlorperazine, EMLA cream, Benadryl, Pepcid, Zofran, Dexamethasone   SELF CARE ACTIVITIES WHILE ON CHEMOTHERAPY: Increase your fluid intake 48 hours prior to treatment and drink at least 2 quarts per day after treatment., No alcohol intake., No aspirin or other medications unless approved by your oncologist., Eat foods that are light and easy to digest., Eat foods at cold or room temperature., No fried, fatty, or spicy foods immediately before or after treatment., Have teeth cleaned professionally before starting treatment. Keep dentures and partial plates clean., Use soft toothbrush and do not use mouthwashes that contain alcohol. Biotene is a good mouthwash that is available at most pharmacies or may be ordered by calling [phone]., Use warm salt water gargles (1 teaspoon salt per 1 quart warm water) before and after meals and at bedtime. Or you may rinse with 2 tablespoons of three -percent hydrogen peroxide mixed in eight ounces of water., Always use sunscreen with SPF (Sun Protection Factor) of 30 or higher., Use your nausea medication as directed to prevent nausea., Use your stool softener or laxative as directed to prevent constipation. and Use your anti-diarrheal medication  as directed to stop diarrhea.   Please wash your hands for at least 30 seconds using warm soapy water. Handwashing is the #1 way to prevent the spread of germs. Stay away from sick people or  people who are getting over a cold. If you develop respiratory systems such as green/yellow mucus production or productive cough or persistent cough let us know and we will see if you need an antibiotic. It is a good idea to keep a pair of gloves on when going into grocery stores/[employer] to decrease your risk of coming into contact with germs on the carts, etc. Carry alcohol hand gel with you at all times and use it frequently if out in public. All foods need to be cooked thoroughly. No raw foods. No medium or undercooked meats, eggs. If your food is cooked medium well, it does not need to be hot pink or saturated with bloody liquid at all. Vegetables and fruits need to be washed/rinsed under the faucet with a dish detergent before being consumed. You can eat raw fruits and vegetables unless we tell you otherwise but it would be best if you cooked them or bought frozen. Do not eat off of salad bars or hot bars unless you really trust the cleanliness of the restaurant. If you need dental work, please let us know before you go for your appointment so that we can coordinate the best possible time for you in regards to your chemo regimen. You need to also let your dentist know that you are actively taking chemo. We may need to do labs prior to your dental appointment. We also want your bowels moving at least every other day. If this is not happening, we need to know so that we can get you on a bowel regimen to help you go.      MEDICATIONS: You have been given prescriptions for the following medications:  EMLA cream. Apply a quarter size amount to port site 1 hour prior to chemo. Do not rub in. Cover with plastic wrap.   Metoclopramide 27m tablet. Starting the day after chemo, take 1 tablet four times a day x 48 hours. Then may take 1 tablet four times a day if needed for nausea/vomiting.  Prochlorperazine 159mtablet. Starting the day after chemo, take 1 tablet four times a day x 48 hours. Then may take 1  tablet four times a day if needed for nausea/vomiting.   Over-the-Counter Meds:  Colace - this is a stool softener. Take 10080mapsule 2-6 times a day as needed. If you have to take more than 6 capsules of Colace a day call the [facility] - this is a mild laxative used to treat mild constipation. May take 2 tabs by mouth daily or up to twice a day as needed for mild constipation.  Milk of Magnesia - this is a laxative used to treat moderate to severe constipation. May take 2-4 tablespoons every 8 hours as needed. May increase to 8 tablespoons x 1 dose and if no bowel movement call the [facility] - this is for diarrhea. Take 2 tabs after 1st loose stool and then 1 tab after each loose stool until you go a total of 12 hours without a loose stool. Call [facility] loose stools continue.   SYMPTOMS TO REPORT AS SOON AS POSSIBLE AFTER TREATMENT:  FEVER GREATER THAN 100.5 F  CHILLS WITH OR WITHOUT FEVER  NAUSEA AND VOMITING THAT IS NOT CONTROLLED WITH YOUR  NAUSEA MEDICATION  UNUSUAL SHORTNESS OF BREATH  UNUSUAL BRUISING OR BLEEDING  TENDERNESS IN MOUTH AND THROAT WITH OR WITHOUT PRESENCE OF ULCERS  URINARY PROBLEMS  BOWEL PROBLEMS  UNUSUAL RASH    Wear comfortable clothing and clothing appropriate for easy access to any Portacath or PICC line. Let us know if there is anything that we can do to make your therapy better!      I have been informed and understand all of the instructions given to me and have received a copy. I have been instructed to call the clinic [phone] or my family physician as soon as possible for continued medical care, if indicated. I do not have any more questions at this time but understand that I may call the [facility] or the Patient Navigator at [phone] during office hours should I have questions or need assistance in obtaining follow-up care.      _________________________________________       _______________     __________ Signature of Patient or Authorized Representative        Date                            Time      _________________________________________ Nurse's Signature      Paclitaxel injection What is this medicine? PACLITAXEL (PAK li TAX el) is a chemotherapy drug. It targets fast dividing cells, like cancer cells, and causes these cells to die. This medicine is used to treat ovarian cancer, breast cancer, and other cancers. This medicine may be used for other purposes; ask your health care provider or pharmacist if you have questions. COMMON BRAND NAME(S): Onxol , Taxol What should I tell my health care provider before I take this medicine? They need to know if you have any of these conditions: -blood disorders -irregular heartbeat -infection (especially a virus infection such as chickenpox, cold sores, or herpes) -liver disease -previous or ongoing radiation therapy -an unusual or allergic reaction to paclitaxel, alcohol, polyoxyethylated castor oil, other chemotherapy agents, other medicines, foods, dyes, or preservatives -pregnant or trying to get pregnant -breast-feeding How should I use this medicine? This drug is given as an infusion into a vein. It is administered in a hospital or clinic by a specially trained health care professional. Talk to your pediatrician regarding the use of this medicine in children. Special care may be needed. Overdosage: If you think you have taken too much of this medicine contact a poison control center or emergency room at once. NOTE: This medicine is only for you. Do not share this medicine with others. What if I miss a dose? It is important not to miss your dose. Call your doctor or health care professional if you are unable to keep an appointment. What may interact with this medicine? Do not take this medicine with any of the following medications: -disulfiram -metronidazole This medicine may also interact with  the following medications: -cyclosporine -diazepam -ketoconazole -medicines to increase blood counts like filgrastim, pegfilgrastim, sargramostim -other chemotherapy drugs like cisplatin, doxorubicin, epirubicin, etoposide, teniposide, vincristine -quinidine -testosterone -vaccines -verapamil Talk to your doctor or health care professional before taking any of these medicines: -acetaminophen -aspirin -ibuprofen -ketoprofen -naproxen This list may not describe all possible interactions. Give your health care provider a list of all the medicines, herbs, non-prescription drugs, or dietary supplements you use. Also tell them if you smoke, drink alcohol, or use illegal drugs. Some items may interact with your  medicine. What should I watch for while using this medicine? Your condition will be monitored carefully while you are receiving this medicine. You will need important blood work done while you are taking this medicine. This drug may make you feel generally unwell. This is not uncommon, as chemotherapy can affect healthy cells as well as cancer cells. Report any side effects. Continue your course of treatment even though you feel ill unless your doctor tells you to stop. In some cases, you may be given additional medicines to help with side effects. Follow all directions for their use. Call your doctor or health care professional for advice if you get a fever, chills or sore throat, or other symptoms of a cold or flu. Do not treat yourself. This drug decreases your body's ability to fight infections. Try to avoid being around people who are sick. This medicine may increase your risk to bruise or bleed. Call your doctor or health care professional if you notice any unusual bleeding. Be careful brushing and flossing your teeth or using a toothpick because you may get an infection or bleed more easily. If you have any dental work done, tell your dentist you are receiving this medicine. Avoid  taking products that contain aspirin, acetaminophen, ibuprofen, naproxen, or ketoprofen unless instructed by your doctor. These medicines may hide a fever. Do not become pregnant while taking this medicine. Women should inform their doctor if they wish to become pregnant or think they might be pregnant. There is a potential for serious side effects to an unborn child. Talk to your health care professional or pharmacist for more information. Do not breast-feed an infant while taking this medicine. Men are advised not to father a child while receiving this medicine. What side effects may I notice from receiving this medicine? Side effects that you should report to your doctor or health care professional as soon as possible: -allergic reactions like skin rash, itching or hives, swelling of the face, lips, or tongue -low blood counts - This drug may decrease the number of white blood cells, red blood cells and platelets. You may be at increased risk for infections and bleeding. -signs of infection - fever or chills, cough, sore throat, pain or difficulty passing urine -signs of decreased platelets or bleeding - bruising, pinpoint red spots on the skin, black, tarry stools, nosebleeds -signs of decreased red blood cells - unusually weak or tired, fainting spells, lightheadedness -breathing problems -chest pain -high or low blood pressure -mouth sores -nausea and vomiting -pain, swelling, redness or irritation at the injection site -pain, tingling, numbness in the hands or feet -slow or irregular heartbeat -swelling of the ankle, feet, hands Side effects that usually do not require medical attention (report to your doctor or health care professional if they continue or are bothersome): -bone pain -complete hair loss including hair on your head, underarms, pubic hair, eyebrows, and eyelashes -changes in the color of fingernails -diarrhea -loosening of the fingernails -loss of appetite -muscle or  joint pain -red flush to skin -sweating This list may not describe all possible side effects. Call your doctor for medical advice about side effects. You may report side effects to FDA at 1-800-FDA-1088. Where should I keep my medicine? This drug is given in a hospital or clinic and will not be stored at home. NOTE: This sheet is a summary. It may not cover all possible information. If you have questions about this medicine, talk to your doctor, pharmacist, or health care provider.  2014,  Elsevier/Gold Standard. (2012-05-15 16:41:21) Carboplatin injection What is this medicine? CARBOPLATIN (KAR boe pla tin) is a chemotherapy drug. It targets fast dividing cells, like cancer cells, and causes these cells to die. This medicine is used to treat ovarian cancer and many other cancers. This medicine may be used for other purposes; ask your health care provider or pharmacist if you have questions. COMMON BRAND NAME(S): Paraplatin What should I tell my health care provider before I take this medicine? They need to know if you have any of these conditions: -blood disorders -hearing problems -kidney disease -recent or ongoing radiation therapy -an unusual or allergic reaction to carboplatin, cisplatin, other chemotherapy, other medicines, foods, dyes, or preservatives -pregnant or trying to get pregnant -breast-feeding How should I use this medicine? This drug is usually given as an infusion into a vein. It is administered in a hospital or clinic by a specially trained health care professional. Talk to your pediatrician regarding the use of this medicine in children. Special care may be needed. Overdosage: If you think you have taken too much of this medicine contact a poison control center or emergency room at once. NOTE: This medicine is only for you. Do not share this medicine with others. What if I miss a dose? It is important not to miss a dose. Call your doctor or health care professional if  you are unable to keep an appointment. What may interact with this medicine? -medicines for seizures -medicines to increase blood counts like filgrastim, pegfilgrastim, sargramostim -some antibiotics like amikacin, gentamicin, neomycin, streptomycin, tobramycin -vaccines Talk to your doctor or health care professional before taking any of these medicines: -acetaminophen -aspirin -ibuprofen -ketoprofen -naproxen This list may not describe all possible interactions. Give your health care provider a list of all the medicines, herbs, non-prescription drugs, or dietary supplements you use. Also tell them if you smoke, drink alcohol, or use illegal drugs. Some items may interact with your medicine. What should I watch for while using this medicine? Your condition will be monitored carefully while you are receiving this medicine. You will need important blood work done while you are taking this medicine. This drug may make you feel generally unwell. This is not uncommon, as chemotherapy can affect healthy cells as well as cancer cells. Report any side effects. Continue your course of treatment even though you feel ill unless your doctor tells you to stop. In some cases, you may be given additional medicines to help with side effects. Follow all directions for their use. Call your doctor or health care professional for advice if you get a fever, chills or sore throat, or other symptoms of a cold or flu. Do not treat yourself. This drug decreases your body's ability to fight infections. Try to avoid being around people who are sick. This medicine may increase your risk to bruise or bleed. Call your doctor or health care professional if you notice any unusual bleeding. Be careful brushing and flossing your teeth or using a toothpick because you may get an infection or bleed more easily. If you have any dental work done, tell your dentist you are receiving this medicine. Avoid taking products that contain  aspirin, acetaminophen, ibuprofen, naproxen, or ketoprofen unless instructed by your doctor. These medicines may hide a fever. Do not become pregnant while taking this medicine. Women should inform their doctor if they wish to become pregnant or think they might be pregnant. There is a potential for serious side effects to an unborn child. Talk to  your health care professional or pharmacist for more information. Do not breast-feed an infant while taking this medicine. What side effects may I notice from receiving this medicine? Side effects that you should report to your doctor or health care professional as soon as possible: -allergic reactions like skin rash, itching or hives, swelling of the face, lips, or tongue -signs of infection - fever or chills, cough, sore throat, pain or difficulty passing urine -signs of decreased platelets or bleeding - bruising, pinpoint red spots on the skin, black, tarry stools, nosebleeds -signs of decreased red blood cells - unusually weak or tired, fainting spells, lightheadedness -breathing problems -changes in hearing -changes in vision -chest pain -high blood pressure -low blood counts - This drug may decrease the number of white blood cells, red blood cells and platelets. You may be at increased risk for infections and bleeding. -nausea and vomiting -pain, swelling, redness or irritation at the injection site -pain, tingling, numbness in the hands or feet -problems with balance, talking, walking -trouble passing urine or change in the amount of urine Side effects that usually do not require medical attention (report to your doctor or health care professional if they continue or are bothersome): -hair loss -loss of appetite -metallic taste in the mouth or changes in taste This list may not describe all possible side effects. Call your doctor for medical advice about side effects. You may report side effects to FDA at 1-800-FDA-1088. Where should I keep  my medicine? This drug is given in a hospital or clinic and will not be stored at home. NOTE: This sheet is a summary. It may not cover all possible information. If you have questions about this medicine, talk to your doctor, pharmacist, or health care provider.  2014, [employer]vier/Gold Standard. (2007-06-27 14:38:05) Diphenhydramine injection What is this medicine? DIPHENHYDRAMINE (dye fen HYE dra meen) is an antihistamine. It is used to treat the symptoms of an allergic reaction and motion sickness. It is also used to treat Parkinson's disease. This medicine may be used for other purposes; ask your health care provider or pharmacist if you have questions. COMMON BRAND NAME(S): Benadryl What should I tell my health care provider before I take this medicine? They need to know if you have any of these conditions: -asthma or lung disease -glaucoma -high blood pressure or heart disease -liver disease -pain or difficulty passing urine -prostate trouble -ulcers or other stomach problems -an unusual or allergic reaction to diphenhydramine, antihistamines, other medicines foods, dyes, or preservatives -pregnant or trying to get pregnant -breast-feeding How should I use this medicine? This medicine is for injection into a vein or a muscle. It is usually given by a health care professional in a hospital or clinic setting. If you get this medicine at home, you will be taught how to prepare and give this medicine. Use exactly as directed. Take your medicine at regular intervals. Do not take your medicine more often than directed. It is important that you put your used needles and syringes in a special sharps container. Do not put them in a trash can. If you do not have a sharps container, call your pharmacist or healthcare provider to get one. Talk to your pediatrician regarding the use of this medicine in children. While this drug may be prescribed for selected conditions, precautions do apply. This  medicine is not approved for use in newborns and premature babies. Patients over 29 years old may have a stronger reaction and need a smaller dose. Overdosage: If  you think you have taken too much of this medicine contact a poison control center or emergency room at once. NOTE: This medicine is only for you. Do not share this medicine with others. What if I miss a dose? If you miss a dose, take it as soon as you can. If it is almost time for your next dose, take only that dose. Do not take double or extra doses. What may interact with this medicine? Do not take this medicine with any of the following medications: -MAOIs like Carbex, Eldepryl, Marplan, Nardil, and Parnate This medicine may also interact with the following medications: -alcohol -barbiturates, like phenobarbital -medicines for bladder spasm like oxybutynin, tolterodine -medicines for blood pressure -medicines for depression, anxiety, or psychotic disturbances -medicines for movement abnormalities or Parkinson's disease -medicines for sleep -other medicines for cold, cough or allergy -some medicines for the stomach like chlordiazepoxide, dicyclomine This list may not describe all possible interactions. Give your health care provider a list of all the medicines, herbs, non-prescription drugs, or dietary supplements you use. Also tell them if you smoke, drink alcohol, or use illegal drugs. Some items may interact with your medicine. What should I watch for while using this medicine? Your condition will be monitored carefully while you are receiving this medicine. Tell your doctor or healthcare professional if your symptoms do not start to get better or if they get worse. You may get drowsy or dizzy. Do not drive, use machinery, or do anything that needs mental alertness until you know how this medicine affects you. Do not stand or sit up quickly, especially if you are an older patient. This reduces the risk of dizzy or fainting  spells. Alcohol may interfere with the effect of this medicine. Avoid alcoholic drinks. Your mouth may get dry. Chewing sugarless gum or sucking hard candy, and drinking plenty of water may help. Contact your doctor if the problem does not go away or is severe. What side effects may I notice from receiving this medicine? Side effects that you should report to your doctor or health care professional as soon as possible: -allergic reactions like skin rash, itching or hives, swelling of the face, lips, or tongue -breathing problems -changes in vision -chills -confused, agitated, nervous -irregular or fast heartbeat -low blood pressure -seizures -tremor -trouble passing urine -unusual bleeding or bruising -unusually weak or tired Side effects that usually do not require medical attention (report to your doctor or health care professional if they continue or are bothersome): -constipation, diarrhea -drowsy -headache -loss of appetite -stomach upset, vomiting -sweating -thick mucous This list may not describe all possible side effects. Call your doctor for medical advice about side effects. You may report side effects to FDA at 1-800-FDA-1088. Where should I keep my medicine? Keep out of the reach of children. If you are using this medicine at home, you will be instructed on how to store this medicine. Throw away any unused medicine after the expiration date on the label. NOTE: This sheet is a summary. It may not cover all possible information. If you have questions about this medicine, talk to your doctor, pharmacist, or health care provider.  2014, Else[employer] Standard. (2007-07-11 14:28:35) Ondansetron injection What is this medicine? ONDANSETRON (on DAN se tron) is used to treat nausea and vomiting caused by chemotherapy. It is also used to prevent or treat nausea and vomiting after surgery. This medicine may be used for other purposes; ask your health care provider or pharmacist if  you have questions.  COMMON BRAND NAME(S): Zofran What should I tell my health care provider before I take this medicine? They need to know if you have any of these conditions: -heart disease -history of irregular heartbeat -liver disease -low levels of magnesium or potassium in the blood -an unusual or allergic reaction to ondansetron, granisetron, other medicines, foods, dyes, or preservatives -pregnant or trying to get pregnant -breast-feeding How should I use this medicine? This medicine is for infusion into a vein. It is given by a health care professional in a hospital or clinic setting. Talk to your pediatrician regarding the use of this medicine in children. Special care may be needed. Overdosage: If you think you have taken too much of this medicine contact a poison control center or emergency room at once. NOTE: This medicine is only for you. Do not share this medicine with others. What if I miss a dose? This does not apply. What may interact with this medicine? Do not take this medicine with any of the following medications: -apomorphine -certain medicines for fungal infections like fluconazole, itraconazole, ketoconazole, posaconazole, voriconazole -cisapride -dofetilide -dronedarone -pimozide -thioridazine -ziprasidone  This medicine may also interact with the following medications: -carbamazepine -certain medicines for depression, anxiety, or psychotic disturbances -fentanyl -linezolid -MAOIs like Carbex, Eldepryl, Marplan, Nardil, and Parnate -methylene blue (injected into a vein) -other medicines that prolong the QT interval (cause an abnormal heart rhythm) -phenytoin -rifampicin -tramadol This list may not describe all possible interactions. Give your health care provider a list of all the medicines, herbs, non-prescription drugs, or dietary supplements you use. Also tell them if you smoke, drink alcohol, or use illegal drugs. Some items may interact with your  medicine. What should I watch for while using this medicine? Your condition will be monitored carefully while you are receiving this medicine. What side effects may I notice from receiving this medicine? Side effects that you should report to your doctor or health care professional as soon as possible: -allergic reactions like skin rash, itching or hives, swelling of the face, lips, or tongue -breathing problems -confusion -dizziness -fast or irregular heartbeat -feeling faint or lightheaded, falls -fever and chills -loss of balance or coordination -seizures -sweating -swelling of the hands and feet -tightness in the chest -tremors -unusually weak or tired Side effects that usually do not require medical attention (report to your doctor or health care professional if they continue or are bothersome): -constipation or diarrhea -headache This list may not describe all possible side effects. Call your doctor for medical advice about side effects. You may report side effects to FDA at 1-800-FDA-1088. Where should I keep my medicine? This drug is given in a hospital or clinic and will not be stored at home. NOTE: This sheet is a summary. It may not cover all possible information. If you have questions about this medicine, talk to your doctor, pharmacist, or health care provider.  2014, [employer]/Gold Standard. (2012-12-27 16:18:28) Dexamethasone injection What is this medicine? DEXAMETHASONE (dex a METH a sone) is a corticosteroid. It is used to treat inflammation of the skin, joints, lungs, and other organs. Common conditions treated include asthma, allergies, and arthritis. It is also used for other conditions, like blood disorders and diseases of the adrenal glands. This medicine may be used for other purposes; ask your health care provider or pharmacist if you have questions. COMMON BRAND NAME(S): Decadron, Solurex What should I tell my health care provider before I take this  medicine? They need to know if you have any  of these conditions: -blood clotting problems -Cushing's syndrome -diabetes -glaucoma -heart problems or disease -high blood pressure -infection like herpes, measles, tuberculosis, or chickenpox -kidney disease -liver disease -mental problems -myasthenia gravis -osteoporosis -previous heart attack -seizures -stomach, ulcer or intestine disease including colitis and diverticulitis -thyroid problem -an unusual or allergic reaction to dexamethasone, corticosteroids, other medicines, lactose, foods, dyes, or preservatives -pregnant or trying to get pregnant -breast-feeding How should I use this medicine? This medicine is for injection into a muscle, joint, lesion, soft tissue, or vein. It is given by a health care professional in a hospital or clinic setting. Talk to your pediatrician regarding the use of this medicine in children. Special care may be needed. Overdosage: If you think you have taken too much of this medicine contact a poison control center or emergency room at once. NOTE: This medicine is only for you. Do not share this medicine with others. What if I miss a dose? This may not apply. If you are having a series of injections over a prolonged period, try not to miss an appointment. Call your doctor or health care professional to reschedule if you are unable to keep an appointment. What may interact with this medicine? Do not take this medicine with any of the following medications: -mifepristone, RU-486 -vaccines This medicine may also interact with the following medications: -amphotericin B -antibiotics like clarithromycin, erythromycin, and troleandomycin -aspirin and aspirin-like drugs -barbiturates like phenobarbital -carbamazepine -cholestyramine -cholinesterase inhibitors like donepezil, galantamine, rivastigmine, and tacrine -cyclosporine -digoxin -diuretics -ephedrine -female hormones, like estrogens or  progestins and birth control pills -indinavir -isoniazid -ketoconazole -medicines for diabetes -medicines that improve muscle tone or strength for conditions like myasthenia gravis -NSAIDs, medicines for pain and inflammation, like ibuprofen or naproxen -phenytoin -rifampin -thalidomide -warfarin This list may not describe all possible interactions. Give your health care provider a list of all the medicines, herbs, non-prescription drugs, or dietary supplements you use. Also tell them if you smoke, drink alcohol, or use illegal drugs. Some items may interact with your medicine. What should I watch for while using this medicine? Your condition will be monitored carefully while you are receiving this medicine. If you are taking this medicine for a long time, carry an identification card with your name and address, the type and dose of your medicine, and your doctor's name and address. This medicine may increase your risk of getting an infection. Stay away from people who are sick. Tell your doctor or health care professional if you are around anyone with measles or chickenpox. Talk to your health care provider before you get any vaccines that you take this medicine. If you are going to have surgery, tell your doctor or health care professional that you have taken this medicine within the last twelve months. Ask your doctor or health care professional about your diet. You may need to lower the amount of salt you eat. The medicine can increase your blood sugar. If you are a diabetic check with your doctor if you need help adjusting the dose of your diabetic medicine. What side effects may I notice from receiving this medicine? Side effects that you should report to your doctor or health care professional as soon as possible: -allergic reactions like skin rash, itching or hives, swelling of the face, lips, or tongue -black or tarry stools -change in the amount of urine -changes in  vision -confusion, excitement, restlessness, a false sense of well-being -fever, sore throat, sneezing, cough, or other signs of infection, wounds that  will not heal -hallucinations -increased thirst -mental depression, mood swings, mistaken feelings of self importance or of being mistreated -pain in hips, back, ribs, arms, shoulders, or legs -pain, redness, or irritation at the injection site -redness, blistering, peeling or loosening of the skin, including inside the mouth -rounding out of face -swelling of feet or lower legs -unusual bleeding or bruising -unusual tired or weak -wounds that do not heal Side effects that usually do not require medical attention (report to your doctor or health care professional if they continue or are bothersome): -diarrhea or constipation -change in taste -headache -nausea, vomiting -skin problems, acne, thin and shiny skin -touble sleeping -unusual growth of hair on the face or body -weight gain This list may not describe all possible side effects. Call your doctor for medical advice about side effects. You may report side effects to FDA at 1-800-FDA-1088. Where should I keep my medicine? This drug is given in a hospital or clinic and will not be stored at home. NOTE: This sheet is a summary. It may not cover all possible information. If you have questions about this medicine, talk to your doctor, pharmacist, or health care provider.  2014, Elsevier/Gold Standard. (2007-07-13 14:04:12) Metoclopramide tablets What is this medicine? METOCLOPRAMIDE (met oh kloe PRA mide) is used to treat the symptoms of gastroesophageal reflux disease (GERD) like heartburn. It is also used to treat people with slow emptying of the stomach and intestinal tract. This medicine may be used for other purposes; ask your health care provider or pharmacist if you have questions. COMMON BRAND NAME(S): Reglan What should I tell my health care provider before I take this  medicine? They need to know if you have any of these conditions: -breast cancer -depression -diabetes -heart failure -high blood pressure -kidney disease -liver disease -Parkinson's disease or a movement disorder -pheochromocytoma -seizures -stomach obstruction, bleeding, or perforation -an unusual or allergic reaction to metoclopramide, procainamide, sulfites, other medicines, foods, dyes, or preservatives -pregnant or trying to get pregnant -breast-feeding How should I use this medicine? Take this medicine by mouth with a glass of water. Follow the directions on the prescription label. Take this medicine on an empty stomach, about 30 minutes before eating. Take your doses at regular intervals. Do not take your medicine more often than directed. Do not stop taking except on the advice of your doctor or health care professional. A special MedGuide will be given to you by the pharmacist with each prescription and refill. Be sure to read this information carefully each time. Talk to your pediatrician regarding the use of this medicine in children. Special care may be needed. Overdosage: If you think you have taken too much of this medicine contact a poison control center or emergency room at once. NOTE: This medicine is only for you. Do not share this medicine with others. What if I miss a dose? If you miss a dose, take it as soon as you can. If it is almost time for your next dose, take only that dose. Do not take double or extra doses. What may interact with this medicine? -acetaminophen -cyclosporine -digoxin -medicines for blood pressure -medicines for diabetes, including insulin -medicines for hay fever and other allergies -medicines for depression, especially an Monoamine Oxidase Inhibitor (MAOI) -medicines for Parkinson's disease, like levodopa -medicines for sleep or for pain -tetracycline This list may not describe all possible interactions. Give your health care provider a  list of all the medicines, herbs, non-prescription drugs, or dietary supplements you use.  Also tell them if you smoke, drink alcohol, or use illegal drugs. Some items may interact with your medicine. What should I watch for while using this medicine? It may take a few weeks for your stomach condition to start to get better. However, do not take this medicine for longer than 12 weeks. The longer you take this medicine, and the more you take it, the greater your chances are of developing serious side effects. If you are an elderly patient, a female patient, or you have diabetes, you may be at an increased risk for side effects from this medicine. Contact your doctor immediately if you start having movements you cannot control such as lip smacking, rapid movements of the tongue, involuntary or uncontrollable movements of the eyes, head, arms and legs, or muscle twitches and spasms. Patients and their families should watch out for worsening depression or thoughts of suicide. Also watch out for any sudden or severe changes in feelings such as feeling anxious, agitated, panicky, irritable, hostile, aggressive, impulsive, severely restless, overly excited and hyperactive, or not being able to sleep. If this happens, especially at the beginning of treatment or after a change in dose, call your doctor. Do not treat yourself for high fever. Ask your doctor or health care professional for advice. You may get drowsy or dizzy. Do not drive, use machinery, or do anything that needs mental alertness until you know how this drug affects you. Do not stand or sit up quickly, especially if you are an older patient. This reduces the risk of dizzy or fainting spells. Alcohol can make you more drowsy and dizzy. Avoid alcoholic drinks. What side effects may I notice from receiving this medicine? Side effects that you should report to your doctor or health care professional as soon as possible: -allergic reactions like skin rash,  itching or hives, swelling of the face, lips, or tongue -abnormal production of milk in females -breast enlargement in both males and females -change in the way you walk -difficulty moving, speaking or swallowing -drooling, lip smacking, or rapid movements of the tongue -excessive sweating -fever -involuntary or uncontrollable movements of the eyes, head, arms and legs -irregular heartbeat or palpitations -muscle twitches and spasms -unusually weak or tired Side effects that usually do not require medical attention (report to your doctor or health care professional if they continue or are bothersome): -change in sex drive or performance -depressed mood -diarrhea -difficulty sleeping -headache -menstrual changes -restless or nervous This list may not describe all possible side effects. Call your doctor for medical advice about side effects. You may report side effects to FDA at 1-800-FDA-1088. Where should I keep my medicine? Keep out of the reach of children. Store at room temperature between 20 and 25 degrees C (68 and 77 degrees F). Protect from light. Keep container tightly closed. Throw away any unused medicine after the expiration date. NOTE: This sheet is a summary. It may not cover all possible information. If you have questions about this medicine, talk to your doctor, pharmacist, or health care provider.  2014, [employer]/Gold Standard. (2011-07-20 13:04:38) Prochlorperazine tablets What is this medicine? PROCHLORPERAZINE (proe klor PER a zeen) helps to control severe nausea and vomiting. This medicine is also used to treat schizophrenia. It can also help patients who experience anxiety that is not due to psychological illness. This medicine may be used for other purposes; ask your health care provider or pharmacist if you have questions. COMMON BRAND NAME(S): Compazine What should I tell my health care  provider before I take this medicine? They need to know if you have any of  these conditions: -blood disorders or disease -dementia -liver disease or jaundice -Parkinson's disease -uncontrollable movement disorder -an unusual or allergic reaction to prochlorperazine, other medicines, foods, dyes, or preservatives -pregnant or trying to get pregnant -breast-feeding How should I use this medicine? Take this medicine by mouth with a glass of water. Follow the directions on the prescription label. Take your doses at regular intervals. Do not take your medicine more often than directed. Do not stop taking this medicine suddenly. This can cause nausea, vomiting, and dizziness. Ask your doctor or health care professional for advice. Talk to your pediatrician regarding the use of this medicine in children. Special care may be needed. While this drug may be prescribed for children as young as 2 years for selected conditions, precautions do apply. Overdosage: If you think you have taken too much of this medicine contact a poison control center or emergency room at once. NOTE: This medicine is only for you. Do not share this medicine with others. What if I miss a dose? If you miss a dose, take it as soon as you can. If it is almost time for your next dose, take only that dose. Do not take double or extra doses. What may interact with this medicine? Do not take this medicine with any of the following medications: -amoxapine -antidepressants like citalopram, escitalopram, fluoxetine, paroxetine, and sertraline -deferoxamine -dofetilide -maprotiline -tricyclic antidepressants like amitriptyline, clomipramine, imipramine, nortiptyline and others This medicine may also interact with the following medications: -lithium -medicines for pain -phenytoin -propranolol -warfarin This list may not describe all possible interactions. Give your health care provider a list of all the medicines, herbs, non-prescription drugs, or dietary supplements you use. Also tell them if you smoke,  drink alcohol, or use illegal drugs. Some items may interact with your medicine. What should I watch for while using this medicine? Visit your doctor or health care professional for regular checks on your progress. You may get drowsy or dizzy. Do not drive, use machinery, or do anything that needs mental alertness until you know how this medicine affects you. Do not stand or sit up quickly, especially if you are an older patient. This reduces the risk of dizzy or fainting spells. Alcohol may interfere with the effect of this medicine. Avoid alcoholic drinks. This medicine can reduce the response of your body to heat or cold. Dress warm in cold weather and stay hydrated in hot weather. If possible, avoid extreme temperatures like saunas, hot tubs, very hot or cold showers, or activities that can cause dehydration such as vigorous exercise. This medicine can make you more sensitive to the sun. Keep out of the sun. If you cannot avoid being in the sun, wear protective clothing and use sunscreen. Do not use sun lamps or tanning beds/booths. Your mouth may get dry. Chewing sugarless gum or sucking hard candy, and drinking plenty of water may help. Contact your doctor if the problem does not go away or is severe. What side effects may I notice from receiving this medicine? Side effects that you should report to your doctor or health care professional as soon as possible: -blurred vision -breast enlargement in men or women -breast milk in women who are not breast-feeding -chest pain, fast or irregular heartbeat -confusion, restlessness -dark yellow or brown urine -difficulty breathing or swallowing -dizziness or fainting spells -drooling, shaking, movement difficulty (shuffling walk) or rigidity -fever, chills, sore throat -  involuntary or uncontrollable movements of the eyes, mouth, head, arms, and legs -seizures -stomach area pain -unusually weak or tired -unusual bleeding or bruising -yellowing of  skin or eyes Side effects that usually do not require medical attention (report to your doctor or health care professional if they continue or are bothersome): -difficulty passing urine -difficulty sleeping -headache -sexual dysfunction -skin rash, or itching This list may not describe all possible side effects. Call your doctor for medical advice about side effects. You may report side effects to FDA at 1-800-FDA-1088. Where should I keep my medicine? Keep out of the reach of children. Store at room temperature between 15 and 30 degrees C (59 and 86 degrees F). Protect from light. Throw away any unused medicine after the expiration date. NOTE: This sheet is a summary. It may not cover all possible information. If you have questions about this medicine, talk to your doctor, pharmacist, or health care provider.  2014, [employer] Standard. (2011-08-10 16:59:39) Lidocaine; Prilocaine cream What is this medicine? LIDOCAINE; PRILOCAINE (LYE doe kane; PRIL oh kane) is a topical anesthetic that causes loss of feeling in the skin and surrounding tissues. It is used to numb the skin before procedures or injections. This medicine may be used for other purposes; ask your health care provider or pharmacist if you have questions. COMMON BRAND NAME(S): EMLA What should I tell my health care provider before I take this medicine? They need to know if you have any of these conditions: -glucose-6-phosphate deficiencies -heart disease -kidney or liver disease -methemoglobinemia -an unusual or allergic reaction to lidocaine, prilocaine, other medicines, foods, dyes, or preservatives -pregnant or trying to get pregnant -breast-feeding How should I use this medicine? This medicine is for external use only on the skin. Do not take by mouth. Follow the directions on the prescription label. Wash hands before and after use. Do not use more or leave in contact with the skin longer than directed. Do not apply to  eyes or open wounds. It can cause irritation and blurred or temporary loss of vision. If this medicine comes in contact with your eyes, immediately rinse the eye with water. Do not touch or rub the eye. Contact your health care provider right away. Talk to your pediatrician regarding the use of this medicine in children. While this medicine may be prescribed for children for selected conditions, precautions do apply. Overdosage: If you think you have taken too much of this medicine contact a poison control center or emergency room at once. NOTE: This medicine is only for you. Do not share this medicine with others. What if I miss a dose? This medicine is usually only applied once prior to each procedure. It must be in contact with the skin for a period of time for it to work. If you applied this medicine later than directed, tell your health care professional before starting the procedure. What may interact with this medicine? -acetaminophen -chloroquine -dapsone -medicines to control heart rhythm -nitrates like nitroglycerin and nitroprusside -other ointments, creams, or sprays that may contain anesthetic medicine -phenobarbital -phenytoin -quinine -sulfonamides like sulfacetamide, sulfamethoxazole, sulfasalazine and others This list may not describe all possible interactions. Give your health care provider a list of all the medicines, herbs, non-prescription drugs, or dietary supplements you use. Also tell them if you smoke, drink alcohol, or use illegal drugs. Some items may interact with your medicine. What should I watch for while using this medicine? Be careful to avoid injury to the treated area while it  is numb and you are not aware of pain. Avoid scratching, rubbing, or exposing the treated area to hot or cold temperatures until complete sensation has returned. The numb feeling will wear off a few hours after applying the cream. What side effects may I notice from receiving this  medicine? Side effects that you should report to your doctor or health care professional as soon as possible: -blurred vision -chest pain -difficulty breathing -dizziness -drowsiness -fast or irregular heartbeat -skin rash or itching -swelling of your throat, lips, or face -trembling Side effects that usually do not require medical attention (report to your doctor or health care professional if they continue or are bothersome): -changes in ability to feel hot or cold -redness and swelling at the application site This list may not describe all possible side effects. Call your doctor for medical advice about side effects. You may report side effects to FDA at 1-800-FDA-1088. Where should I keep my medicine? Keep out of reach of children. Store at room temperature between 15 and 30 degrees C (59 and 86 degrees F). Keep container tightly closed. Throw away any unused medicine after the expiration date. NOTE: This sheet is a summary. It may not cover all possible information. If you have questions about this medicine, talk to your doctor, pharmacist, or health care provider.  2014, [employer]vier/[employer] Standard. (2007-09-25 17:14:35)

## 2013-05-21 NOTE — Discharge Instructions (Signed)
Do not drive or engage in heavy activity for 24 hours  You may shower tomorrow  There is a medical adhesive (Dermabond) on your incision. It will begin to peel off in 7-10 days  Call if you develop redness or drainage from the incision, or a fever > 101 F

## 2013-05-21 NOTE — Anesthesia Preprocedure Evaluation (Addendum)
Anesthesia Evaluation  Patient identified by MRN, date of birth, ID band Patient awake    Reviewed: Allergy & Precautions, H&P , NPO status , Patient's Chart, lab work & pertinent test results  Airway Mallampati: II TM Distance: >3 FB     Dental  (+) Edentulous Upper, Edentulous Lower   Pulmonary Current Smoker,    + decreased breath sounds      Cardiovascular Rhythm:Regular Rate:Normal     Neuro/Psych    GI/Hepatic   Endo/Other    Renal/GU      Musculoskeletal   Abdominal   Peds  Hematology   Anesthesia Other Findings   Reproductive/Obstetrics                           Anesthesia Physical Anesthesia Plan  ASA: III  Anesthesia Plan: MAC   Post-op Pain Management:    Induction: Intravenous  Airway Management Planned: Natural Airway and Simple Face Mask  Additional Equipment:   Intra-op Plan:   Post-operative Plan:   Informed Consent: I have reviewed the patients History and Physical, chart, labs and discussed the procedure including the risks, benefits and alternatives for the proposed anesthesia with the patient or authorized representative who has indicated his/her understanding and acceptance.     Plan Discussed with: CRNA and Anesthesiologist  Anesthesia Plan Comments: (Non-small cell Ca  L. hilium Smoker/COPD  Plan MAC  Roberts Gaudy, MD)      Anesthesia Quick Evaluation

## 2013-05-21 NOTE — Transfer of Care (Signed)
Immediate Anesthesia Transfer of Care Note  Patient: Heather Coffey  Procedure(s) Performed: Procedure(s): INSERTION PORT-A-CATH (Left)  Patient Location: PACU  Anesthesia Type:MAC  Level of Consciousness: awake, alert  and oriented  Airway & Oxygen Therapy: Patient Spontanous Breathing  Post-op Assessment: Report given to PACU RN and Post -op Vital signs reviewed and stable  Post vital signs: Reviewed and stable  Complications: No apparent anesthesia complications

## 2013-05-21 NOTE — Op Note (Signed)
NAMEMCKINNLEY, COTTIER                  ACCOUNT NO.:  [id number]  MEDICAL RECORD NO.:  [id number]  LOCATION:  [facility]                         FACILITY:  [facility]  PHYSICIAN:  Revonda Standard. Roxan Hockey, M.D.DATE OF BIRTH:  1960/10/29  DATE OF PROCEDURE:  05/21/2013 DATE OF DISCHARGE:  05/21/2013                              OPERATIVE REPORT   PREOPERATIVE DIAGNOSIS:  Lung cancer, needs IV access for chemotherapy.  POSTOPERATIVE DIAGNOSIS:  Lung cancer, needs IV access for chemotherapy.  PROCEDURE:  Port-A-Cath placement, left subclavian vein.  SURGEON:  Revonda Standard. Roxan Hockey, MD  ASSISTANT:  None.  ANESTHESIA:  Local with intravenous sedation.  FINDINGS:  Vein accessed on first pass.  Wire passed easily into the right atrium.  Catheter placed with tip at the junction of the superior vena cava and right atrium.  Good return.  CLINICAL NOTE:  Ms. Feeley is a 53 year old woman recently diagnosed with advanced lung cancer who needs intravenous access for cemotherapy.  She was advised to have a Port-A-Cath placed.  I discussed with her the indications, risks, benefits, and alternatives.  She understands the risks of bleeding, pneumothorax, and infection.  She accepts those risks and agrees to proceed.  OPERATIVE NOTE:  Ms. Marlowe was brought to the operating room on May 21, 2013, there Anesthesia administered intravenous sedation.  There was good effect.  The left chest was prepped and draped in usual sterile fashion.  1% lidocaine was used to anesthetize the area for the needle insertion and access to the subclavian vein, 5 mL was used. After ensuring adequate local anesthetic effect the patient was placed in Trendelenburg position.  The subclavian vein was accessed using Seldinger technique on the first attempt. The wire passed easily. Fluoroscopy confirmed the position of the wire within the right atrium. The area for the incision and the pocket for the port was then anesthetized, using  an additional 10 mL (15 mL total) 1% lidocaine.  An incision was made incorporating the wire and a pocket was created between the subcutaneous tissue and the pectoralis fascia.  A peel-away sheath introducer was advanced over the wire.  The port was pre-flushed with heparinized saline.   The catheter was cut to length at 19.5 cm. The wire and inner cannula were removed. The catheter then was advanced through the peel-away sheath introducer, which was removed.  Fluoroscopy confirmed position of the catheter at the junction of the right atrium and superior vena cava. There was excellent return from the port and the port flushed easily. The port then was secured to the pectoralis fascia with 3-0 Vicryl Sutures.  The subcutaneous tissue was closed with running 3-0 Vicryl suture and the skin was closed with a 4-0 Monocryl subcuticular suture.  Dermabond was applied to the incision.  The catheter was flushed with concentrated heparin.  The patient was taken from the operating room to the postanesthetic care unit in good condition.     Revonda Standard Roxan Hockey, M.D.     SCH/MEDQ  D:  05/21/2013  T:  05/21/2013  Job:  [id number]

## 2013-05-21 NOTE — H&P (View-Only) (Signed)
PCP is Glo Herring., MD Referring Provider is Redmond School, MD  Chief Complaint  Patient presents with  . Lung Lesion    and adenopathy per CT CHEST ,,,PET scheduled for 05/01/13    HPI: 53 year old woman sent for consultation regarding a left lung mass.  Heather Coffey is a 53 year old woman with a history of tobacco abuse and a remote history of a right lung abscess. She has smoked one to one half pack per day for about 35 years. She continues to smoke. He says that over the past couple of months she's been feeling tired and rundown. She's had a cough. This is worse at night. She's not had any hemoptysis. She also has had some subjective fever and chills. She has not had any shaking chills or night sweats. She says she has lost about 10 pounds over the past 3 months. She continues to work. She says that she does get short of breath with heavy exertion but feels she could walk up 2 flights of stairs before getting short of breath.  She went to see Dr. Gerarda Fraction. He started her on antibiotics and did a chest x-ray. The chest x-ray showed a fullness in the left hilum. A CT of the chest was done on January 12. It showed endobronchial obstruction in the lingula with mass effect on the left upper lobe bronchus there was a 3.1 x 2.6 cm mass in the lingula with irregular margins. There was hilar and mediastinal and aortopulmonary window adenopathy.  ECOG/ZUBROD= 0   Past Medical History  Diagnosis Date  . Arthritis   . Anxiety   . Hyperlipidemia   . Tobacco abuse     No past surgical history on file.  Family History  Problem Relation Age of Onset  . Cancer Sister     uterine  . COPD Mother   . COPD Father   . COPD Sister   . Hypercholesterolemia Mother   . Diabetes Sister     Social History History  Substance Use Topics  . Smoking status: Current Every Day Smoker -- 1.00 packs/day for 35 years  . Smokeless tobacco: Never Used  . Alcohol Use: No    Current Outpatient  Prescriptions  Medication Sig Dispense Refill  . ALPRAZolam (XANAX) 0.5 MG tablet Take 0.5 mg by mouth. Take 1/2 to 1 tablet as needed daily      . aspirin EC 81 MG tablet Take 81 mg by mouth daily.      Marland Kitchen esomeprazole (NEXIUM) 40 MG capsule Take 40 mg by mouth daily.      Marland Kitchen FLUoxetine (PROZAC) 20 MG capsule Take 20 mg by mouth daily.      . fluticasone (FLONASE) 50 MCG/ACT nasal spray Place 2 sprays into both nostrils daily.      . montelukast (SINGULAIR) 10 MG tablet Take 10 mg by mouth daily.      . pravastatin (PRAVACHOL) 20 MG tablet Take 20 mg by mouth daily.      . promethazine (PHENERGAN) 25 MG tablet Take 25 mg by mouth every 6 (six) hours as needed for nausea or vomiting.      . sucralfate (CARAFATE) 1 G tablet Take 1 g by mouth 4 (four) times daily.      Marland Kitchen triamcinolone cream (KENALOG) 0.1 % Apply 1 application topically 2 (two) times daily as needed.      Marland Kitchen ADVAIR DISKUS 250-50 MCG/DOSE AEPB       . albuterol (PROVENTIL) 4 MG tablet       .  ciprofloxacin (CIPRO) 500 MG tablet       . cyclobenzaprine (FLEXERIL) 10 MG tablet        No current facility-administered medications for this visit.    Allergies  Allergen Reactions  . Augmentin [Amoxicillin-Pot Clavulanate]     Metal taste in mouth  . Codeine     Pt states n/v/d  . Tetracyclines & Related Rash    Pt states n/v/d    Review of Systems  Constitutional: Positive for appetite change, fatigue and unexpected weight change (lost 10 pounds in 3 months).  Eyes: Positive for visual disturbance (blurry vision).  Respiratory: Positive for shortness of breath (with heavy exertion) and wheezing. Negative for chest tightness.        Some pleuritic CP  Cardiovascular: Negative for chest pain (no anginal CP).  Gastrointestinal:       Reflux/ heartburn  Musculoskeletal: Positive for arthralgias.  Neurological: Positive for dizziness ("inner ear problems").  Hematological: Negative for adenopathy. Does not bruise/bleed easily.   Psychiatric/Behavioral: Positive for dysphoric mood. The patient is nervous/anxious.   All other systems reviewed and are negative.    BP 115/68  Pulse 87  Resp 16  Ht 5\' 3"  (1.6 m)  Wt 128 lb (58.06 kg)  BMI 22.68 kg/m2  SpO2 96% Physical Exam  Vitals reviewed. Constitutional: She is oriented to person, place, and time. She appears well-developed and well-nourished. No distress.  53 yo female appearing older than her stated age  HENT:  Head: Normocephalic and atraumatic.  Eyes: EOM are normal. Pupils are equal, round, and reactive to light.  Neck: Neck supple. No thyromegaly present.  Cardiovascular: Normal rate, regular rhythm, normal heart sounds and intact distal pulses.  Exam reveals no gallop and no friction rub.   No murmur heard. Pulmonary/Chest: Effort normal.  Rhonchi on left  Abdominal: Soft. There is no tenderness.  Musculoskeletal: She exhibits no edema.  Lymphadenopathy:    She has no cervical adenopathy.  Neurological: She is alert and oriented to person, place, and time. No cranial nerve deficit.  Skin: Skin is warm and dry.     Diagnostic Tests: CT CHEST WITH CONTRAST  TECHNIQUE:  Multidetector CT imaging of the chest was performed during  intravenous contrast administration.  CONTRAST: 3mL OMNIPAQUE IOHEXOL 300 MG/ML SOLN  COMPARISON: DG CHEST 2 VIEW dated 04/12/2013; DG CHEST 2 VIEW dated  09/15/2011; CT CHEST W/CM dated 11/25/2009  FINDINGS:  Lungs/Pleura: Lingular endobronchial obstruction. Mass effect upon  the left upper lobe bronchus.  Mild biapical pleural parenchymal scarring.  Right upper lobe interstitial opacity on image 21/series 3 is likely  scarring from prior cavitary necrotic pneumonia.  Central lingular irregular lung mass which measures 3.1 x 2.6 cm on  transverse images 28-29. 2.5 cm on sagittal image 72. Contacts the  left major fissure.  No pleural fluid.  Heart/Mediastinum: No supraclavicular adenopathy. Age advanced  aortic  and branch vessel atherosclerosis. Normal heart size, without  pericardial effusion. No central pulmonary embolism, on this  non-dedicated study.  Necrotic prevascular adenopathy at 2.3 x 1.4 cm on image 20.  Contiguous AP window adenopathy at 2.6 x 3.7 cm on image 24/series  2. This is associated intimately with the descending aorta and left  pulmonary artery.  No hilar adenopathy, separate from the infrahilar mass.  Fluid level in the thoracic esophagus on image 18.  Upper Abdomen: Normal adrenal glands. Probable atrophy in the upper  pole right kidney.  Bones/Musculoskeletal: No acute osseous abnormality.  IMPRESSION:  1. Plain film abnormality corresponds to a central lingular lung  mass, most consistent with primary bronchogenic carcinoma.  2. Adenopathy within the prevascular and AP window stations with  direct extension into the left-sided mediastinum. The AP window  adenopathy is intimately associated with the descending thoracic  aorta and left pulmonary artery. These results will be called to the  ordering clinician or representative by the Radiologist Assistant,  and communication documented in the PACS Dashboard.  3. These results will be called to the ordering clinician or  representative by the Radiologist Assistant, and communication  documented in the PACS Dashboard.  Electronically Signed  By: Abigail Miyamoto M.D.  On: 04/16/2013 16:35    Impression: 53 year old woman with a history of tobacco abuse who presents with a 2 month history of cough and malaise. A CXR showed left hilar fullness. A CT confirmed a central left upper lobe mass . There also was mediastinal and AP window adenopathy. This picture is consistent with a stage 3 lung cancer. She is scheduled for a PET on Tuesday 1/27, which is needed for staging purposes.   I reviewed the CT images with Mr and Mrs Coffey. They understand that while this most likely is lung cancer, we need a tissue diagnosis to confirm  that suspicion.   I recommended we proceed with a bronchoscopy and EBUS for diagnosis and staging purposes. We discussed the indications, risks, benefits and alternatives. They understand the risks include those related to general anesthesia, bleeding, pneumothorax and failure to make a diagnosis. She accepts the risks and agrees to proceed.   Plan:  Bronchoscopy and EBUS on Thursday 1/29

## 2013-05-21 NOTE — Brief Op Note (Signed)
05/21/2013  9:42 AM  PATIENT:  Heather Coffey  53 y.o. female  PRE-OPERATIVE DIAGNOSIS:  LUNG CANCER  POST-OPERATIVE DIAGNOSIS:  LUNG CANCER  PROCEDURE:  Procedure(s): INSERTION PORT-A-CATH (Left)  SURGEON:  Surgeon(s) and Role:    * Melrose Nakayama, MD - Primary   ANESTHESIA:   local and IV sedation  EBL:  Total I/O In: 300 [I.V.:300] Out: -   BLOOD ADMINISTERED:none  DRAINS: none   LOCAL MEDICATIONS USED:  LIDOCAINE   SPECIMEN:  No Specimen  DISPOSITION OF SPECIMEN:  N/A   PLAN OF CARE: Discharge to home after PACU  PATIENT DISPOSITION:  PACU - hemodynamically stable.   Delay start of Pharmacological VTE agent (>24hrs) due to surgical blood loss or risk of bleeding: not applicable

## 2013-05-21 NOTE — Interval H&P Note (Signed)
History and Physical Interval Note:  05/21/2013 7:47 AM  Heather Coffey  has presented today for surgery, with the diagnosis of LUNG CANCER  The various methods of treatment have been discussed with the patient and family. After consideration of risks, benefits and other options for treatment, the patient has consented to  Procedure(s): INSERTION PORT-A-CATH (Bilateral) as a surgical intervention .  The patient's history has been reviewed, patient examined, no change in status, stable for surgery.  I have reviewed the patient's chart and labs.  Questions were answered to the patient's satisfaction.     [name] C

## 2013-05-21 NOTE — Anesthesia Postprocedure Evaluation (Signed)
  Anesthesia Post-op Note  Patient: Heather Coffey  Procedure(s) Performed: Procedure(s): INSERTION PORT-A-CATH (Left)  Patient Location: PACU  Anesthesia Type:MAC  Level of Consciousness: awake, alert  and oriented  Airway and Oxygen Therapy: Patient Spontanous Breathing  Post-op Pain: mild  Post-op Assessment: Post-op Vital signs reviewed, Patient's Cardiovascular Status Stable, Respiratory Function Stable, Patent Airway and Pain level controlled  Post-op Vital Signs: stable  Complications: No apparent anesthesia complications

## 2013-05-23 ENCOUNTER — Encounter (HOSPITAL_BASED_OUTPATIENT_CLINIC_OR_DEPARTMENT_OTHER): Payer: Managed Care, Other (non HMO)

## 2013-05-23 DIAGNOSIS — C3410 Malignant neoplasm of upper lobe, unspecified bronchus or lung: Secondary | ICD-10-CM

## 2013-05-23 NOTE — Progress Notes (Signed)
Chemo teaching done and consent signed for Carboplatin/Taxol. Med/chemo calendar to be made and mailed out to patient.

## 2013-05-25 ENCOUNTER — Encounter (HOSPITAL_COMMUNITY): Payer: Self-pay | Admitting: Thoracic Surgery (Cardiothoracic Vascular Surgery)

## 2013-05-30 ENCOUNTER — Inpatient Hospital Stay (HOSPITAL_COMMUNITY): Payer: Managed Care, Other (non HMO)

## 2013-05-30 ENCOUNTER — Encounter (HOSPITAL_COMMUNITY): Payer: Self-pay

## 2013-05-30 ENCOUNTER — Other Ambulatory Visit (HOSPITAL_COMMUNITY)
Admission: RE | Admit: 2013-05-30 | Discharge: 2013-05-30 | Disposition: A | Discharge status: Home / Self Care | Payer: Managed Care, Other (non HMO) | Source: Ambulatory Visit | Attending: Hematology and Oncology | Admitting: Hematology and Oncology

## 2013-05-30 ENCOUNTER — Telehealth (HOSPITAL_COMMUNITY): Payer: Self-pay | Admitting: *Deleted

## 2013-05-30 ENCOUNTER — Encounter (HOSPITAL_BASED_OUTPATIENT_CLINIC_OR_DEPARTMENT_OTHER): Payer: Managed Care, Other (non HMO)

## 2013-05-30 VITALS — BP 102/53 | HR 73 | Temp 97.2°F | Resp 18

## 2013-05-30 VITALS — BP 121/64 | HR 82 | Temp 98.1°F | Resp 20 | Wt 127.8 lb

## 2013-05-30 DIAGNOSIS — C3410 Malignant neoplasm of upper lobe, unspecified bronchus or lung: Secondary | ICD-10-CM

## 2013-05-30 DIAGNOSIS — Z5111 Encounter for antineoplastic chemotherapy: Secondary | ICD-10-CM

## 2013-05-30 DIAGNOSIS — K219 Gastro-esophageal reflux disease without esophagitis: Secondary | ICD-10-CM

## 2013-05-30 DIAGNOSIS — C3430 Malignant neoplasm of lower lobe, unspecified bronchus or lung: Secondary | ICD-10-CM

## 2013-05-30 DIAGNOSIS — C771 Secondary and unspecified malignant neoplasm of intrathoracic lymph nodes: Secondary | ICD-10-CM

## 2013-05-30 DIAGNOSIS — C3490 Malignant neoplasm of unspecified part of unspecified bronchus or lung: Secondary | ICD-10-CM | POA: Insufficient documentation

## 2013-05-30 DIAGNOSIS — R590 Localized enlarged lymph nodes: Secondary | ICD-10-CM

## 2013-05-30 DIAGNOSIS — J4489 Other specified chronic obstructive pulmonary disease: Secondary | ICD-10-CM

## 2013-05-30 DIAGNOSIS — F17200 Nicotine dependence, unspecified, uncomplicated: Secondary | ICD-10-CM

## 2013-05-30 DIAGNOSIS — J449 Chronic obstructive pulmonary disease, unspecified: Secondary | ICD-10-CM

## 2013-05-30 LAB — COMPREHENSIVE METABOLIC PANEL
ALBUMIN: 3.4 g/dL — AB (ref 3.5–5.2)
ALT: 9 U/L (ref 0–35)
AST: 15 U/L (ref 0–37)
Alkaline Phosphatase: 86 U/L (ref 39–117)
BUN: 19 mg/dL (ref 6–23)
CO2: 26 mEq/L (ref 19–32)
CREATININE: 0.77 mg/dL (ref 0.50–1.10)
Calcium: 9.2 mg/dL (ref 8.4–10.5)
Chloride: 103 mEq/L (ref 96–112)
GFR calc non Af Amer: >90 mL/min (ref >90)
GLUCOSE: 117 mg/dL — AB (ref 70–99)
Potassium: 3.9 mEq/L (ref 3.7–5.3)
Sodium: 141 mEq/L (ref 137–147)
Total Bilirubin: <0.2 mg/dL — ABNORMAL LOW (ref 0.3–1.2)
Total Protein: 7.1 g/dL (ref 6.0–8.3)

## 2013-05-30 LAB — CBC WITH DIFFERENTIAL/PLATELET
BASOS PCT: 1 % (ref 0–1)
Basophils Absolute: 0 10*3/uL (ref 0.0–0.1)
EOS ABS: 0.1 10*3/uL (ref 0.0–0.7)
Eosinophils Relative: 2 % (ref 0–5)
HEMATOCRIT: 38.9 % (ref 36.0–46.0)
HEMOGLOBIN: 12.5 g/dL (ref 12.0–15.0)
LYMPHS ABS: 1.6 10*3/uL (ref 0.7–4.0)
Lymphocytes Relative: 26 % (ref 12–46)
MCH: 28.9 pg (ref 26.0–34.0)
MCHC: 32.1 g/dL (ref 30.0–36.0)
MCV: 89.8 fL (ref 78.0–100.0)
MONO ABS: 0.8 10*3/uL (ref 0.1–1.0)
Monocytes Relative: 12 % (ref 3–12)
Neutro Abs: 3.7 10*3/uL (ref 1.7–7.7)
Neutrophils Relative %: 59 % (ref 43–77)
Platelets: 351 10*3/uL (ref 150–400)
RBC: 4.33 MIL/uL (ref 3.87–5.11)
RDW: 16 % — ABNORMAL HIGH (ref 11.5–15.5)
WBC: 6.2 10*3/uL (ref 4.0–10.5)

## 2013-05-30 MED ORDER — CARBOPLATIN CHEMO INJECTION 450 MG/45ML
225.0000 mg | Freq: Once | INTRAVENOUS | Status: AC
  Administered 2013-05-30: 230 mg via INTRAVENOUS
  Filled 2013-05-30: qty 23

## 2013-05-30 MED ORDER — DIPHENHYDRAMINE HCL 50 MG/ML IJ SOLN
50.0000 mg | Freq: Once | INTRAMUSCULAR | Status: AC
  Administered 2013-05-30: 50 mg via INTRAVENOUS
  Filled 2013-05-30: qty 1

## 2013-05-30 MED ORDER — HEPARIN SOD (PORK) LOCK FLUSH 100 UNIT/ML IV SOLN
500.0000 [IU] | Freq: Once | INTRAVENOUS | Status: AC | PRN
  Administered 2013-05-30: 500 [IU]
  Filled 2013-05-30: qty 5

## 2013-05-30 MED ORDER — SODIUM CHLORIDE 0.9 % IV SOLN: 16.0000 mg | Freq: Once | INTRAVENOUS | Status: DC

## 2013-05-30 MED ORDER — SODIUM CHLORIDE 0.9 % IV SOLN
Freq: Once | INTRAVENOUS | Status: AC
  Administered 2013-05-30: 20 mg via INTRAVENOUS
  Filled 2013-05-30: qty 8

## 2013-05-30 MED ORDER — SODIUM CHLORIDE 0.9 % IV SOLN
Freq: Once | INTRAVENOUS | Status: AC
  Administered 2013-05-30: 10:00:00 via INTRAVENOUS

## 2013-05-30 MED ORDER — SODIUM CHLORIDE 0.9 % IJ SOLN: 10.0000 mL | INTRAMUSCULAR | Status: DC | PRN

## 2013-05-30 MED ORDER — FAMOTIDINE IN NACL 20-0.9 MG/50ML-% IV SOLN
20.0000 mg | Freq: Once | INTRAVENOUS | Status: AC
  Administered 2013-05-30: 20 mg via INTRAVENOUS
  Filled 2013-05-30: qty 50

## 2013-05-30 MED ORDER — PACLITAXEL CHEMO INJECTION 300 MG/50ML
45.0000 mg/m2 | Freq: Once | INTRAVENOUS | Status: AC
  Administered 2013-05-30: 72 mg via INTRAVENOUS
  Filled 2013-05-30: qty 12

## 2013-05-30 MED ORDER — DEXAMETHASONE SODIUM PHOSPHATE 10 MG/ML IJ SOLN: 20.0000 mg | Freq: Once | INTRAMUSCULAR | Status: DC

## 2013-05-30 NOTE — Patient Instructions (Signed)
[facility] Discharge Instructions for Patients Receiving Chemotherapy  Today you received the following chemotherapy agents Taxol and Carboplatin  To help prevent nausea and vomiting after your treatment, we encourage you to take your nausea medication as ordered Compazine (  Prochlorperazine) : Day after chemo, take 1 tab four times a day x 48 hours. Then may take 1 tab every 4 hours if needed for nausea. Reglan  (Metoclopramide)--Day after chemo, take 1 tab four times a day x 48 hours. Then may take 1 tab every 4 hours if needed for nausea.  If you develop nausea and vomiting that is not controlled by your nausea medication, call the clinic. If it is after clinic hours your family physician or the after hours number for the clinic or go to the Emergency Department.   BELOW ARE SYMPTOMS THAT SHOULD BE REPORTED IMMEDIATELY:  *FEVER GREATER THAN 101.0 F  *CHILLS WITH OR WITHOUT FEVER  NAUSEA AND VOMITING THAT IS NOT CONTROLLED WITH YOUR NAUSEA MEDICATION  *UNUSUAL SHORTNESS OF BREATH  *UNUSUAL BRUISING OR BLEEDING  TENDERNESS IN MOUTH AND THROAT WITH OR WITHOUT PRESENCE OF ULCERS  *URINARY PROBLEMS  *BOWEL PROBLEMS  UNUSUAL RASH Items with * indicate a potential emergency and should be followed up as soon as possible.  One of the nurses will contact you 24 hours after your treatment. Please let the nurse know about any problems that you may have experienced. Feel free to call the clinic you have any questions or concerns. The clinic phone number is [phone].

## 2013-05-30 NOTE — Progress Notes (Signed)
..  Heather Coffey presents for cycle 1 of carbo taxol. Patient drowsy after premeds, awakens easily when spoken to. Taxol started at titrated rate per protocol, with nurse at bedside during first 15 min interval. Tolerated chemo well.

## 2013-05-30 NOTE — Progress Notes (Signed)
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 Stage III lung cancer    For combined modality therapy    CURRENT THERAPY: Plan is to initiate combined modality therapy with radiation therapy daily and weekly carboplatin/Taxol.  INTERVAL HISTORY: Heather Coffey 53 y.o. 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  She offers no new complaints. She denies any cough, wheezing, shortness of breath, sore throat, peripheral paresthesias, diarrhea, constipation, and skin rash, joint pain, headache, or seizures. She received her first radiation treatment this morning. Patient denies any nausea, vomiting, diarrhea, constipation, or incontinence.  MEDICAL HISTORY: Past Medical History  Diagnosis Date  . Arthritis   . Anxiety   . Hyperlipidemia   . Tobacco abuse   . Shortness of breath     Hx: of breath with exertion  . Depression   . GERD (gastroesophageal reflux disease)   . H/O hiatal hernia   . 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    ALLERGIES:  is allergic to augmentin; codeine; and tetracyclines & related.  MEDICATIONS: has a current medication list which includes the following prescription(s): albuterol, alprazolam, aspirin ec, cyclobenzaprine, esomeprazole, fluoxetine, fluticasone, fluticasone-salmeterol, lidocaine-prilocaine,  metoclopramide, montelukast, multivitamin, pravastatin, prochlorperazine, sucralfate, tramadol, and triamcinolone cream, and the following Facility-Administered Medications: CARBOplatin (PARAPLATIN) 230 mg in sodium chloride 0.9 % 100 mL chemo infusion, heparin lock flush, and sodium chloride.  SURGICAL HISTORY:  Past Surgical History  Procedure Laterality Date  . Tubal ligation    . Breast lumpectomy      Hx: of  . Video bronchoscopy with endobronchial ultrasound N/A 05/03/2013    Procedure: VIDEO BRONCHOSCOPY WITH ENDOBRONCHIAL ULTRASOUND;  Surgeon: Melrose Nakayama, MD;  Location: [facility];  Service: Thoracic;  Laterality: N/A;  . Abdominal hysterectomy      still has ovaries per pt  . Nasal septum surgery    . Portacath placement Left 05/21/2013    Procedure: INSERTION PORT-A-CATH;  Surgeon: Melrose Nakayama, MD;  Location: [facility] OR;  Service: Thoracic;  Laterality: Left;    FAMILY HISTORY: family history includes COPD in her brother, father, mother, and sister; Cancer in her sister; Diabetes in her sister; Hypercholesterolemia in her mother.  SOCIAL HISTORY:  reports that she has been smoking Cigarettes.  She has a 17.5 pack-year smoking history. She has never used smokeless tobacco. She reports that she does not drink alcohol or use illicit drugs.  REVIEW OF SYSTEMS:  Other than that discussed above is noncontributory.  PHYSICAL EXAMINATION: ECOG PERFORMANCE STATUS: 0 - Asymptomatic  Blood pressure 121/64, pulse 82, temperature 98.1 F (36.7 C), temperature source Oral, resp. rate 20, weight 127 lb 12.8 oz (57.97 kg).  GENERAL:alert, no distress and comfortable SKIN: skin color, texture, turgor are normal, no rashes or significant lesions EYES: PERLA; Conjunctiva are pink and non-injected, sclera clear OROPHARYNX:no exudate, no erythema on lips, buccal mucosa, or tongue. NECK: supple, thyroid normal size, non-tender, without nodularity.  No masses CHEST: Increased AP diameter  with no breast masses. LYMPH:  no palpable lymphadenopathy in the cervical, axillary or inguinal LUNGS: clear to auscultation and percussion with normal breathing effort HEART: regular rate & rhythm and no murmurs. ABDOMEN:abdomen soft, non-tender and normal bowel sounds MUSCULOSKELETAL:no cyanosis of digits and no clubbing. Range of motion normal.  NEURO: alert & oriented x 3 with fluent speech, no focal motor/sensory deficits   LABORATORY DATA: Infusion on 05/30/2013  Component Date Value Ref Range Status  . WBC 05/30/2013 6.2  4.0 - 10.5 K/uL Final  . RBC 05/30/2013 4.33  3.87 - 5.11 MIL/uL Final  . Hemoglobin 05/30/2013 12.5  12.0 - 15.0 g/dL Final  . HCT 05/30/2013 38.9  36.0 - 46.0 % Final  . MCV 05/30/2013 89.8  78.0 - 100.0 fL Final  . MCH 05/30/2013 28.9  26.0 - 34.0 pg Final  . MCHC 05/30/2013 32.1  30.0 - 36.0 g/dL Final  . RDW 05/30/2013 16.0* 11.5 - 15.5 % Final  . Platelets 05/30/2013 351  150 - 400 K/uL Final  . Neutrophils Relative % 05/30/2013 59  43 - 77 % Final  . Neutro Abs 05/30/2013 3.7  1.7 - 7.7 K/uL Final  . Lymphocytes Relative 05/30/2013 26  12 - 46 % Final  . Lymphs Abs 05/30/2013 1.6  0.7 - 4.0 K/uL Final  . Monocytes Relative 05/30/2013 12  3 - 12 % Final  . Monocytes Absolute 05/30/2013 0.8  0.1 - 1.0 K/uL Final  . Eosinophils Relative 05/30/2013 2  0 - 5 % Final  . Eosinophils Absolute 05/30/2013 0.1  0.0 - 0.7 K/uL Final  . Basophils Relative 05/30/2013 1  0 - 1 % Final  . Basophils Absolute 05/30/2013 0.0  0.0 - 0.1 K/uL Final  . Sodium 05/30/2013 141  137 - 147 mEq/L Final  . Potassium 05/30/2013 3.9  3.7 - 5.3 mEq/L Final  . Chloride 05/30/2013 103  96 - 112 mEq/L Final  . CO2 05/30/2013 26  19 - 32 mEq/L Final  . Glucose, Bld 05/30/2013 117* 70 - 99 mg/dL Final  . BUN 05/30/2013 19  6 - 23 mg/dL Final  . Creatinine, Ser 05/30/2013 0.77  0.50 - 1.10 mg/dL Final  . Calcium 05/30/2013 9.2  8.4 - 10.5 mg/dL Final  . Total Protein 05/30/2013 7.1   6.0 - 8.3 g/dL Final  . Albumin 05/30/2013 3.4* 3.5 - 5.2 g/dL Final  . AST 05/30/2013 15  0 - 37 U/L Final  . ALT 05/30/2013 9  0 - 35 U/L Final  . Alkaline Phosphatase 05/30/2013 86  39 - 117 U/L Final  . Total Bilirubin 05/30/2013 <0.2* 0.3 - 1.2 mg/dL Final  . GFR calc non Af Amer 05/30/2013 >90  >90 mL/min Final  . GFR calc Af Amer 05/30/2013 >90  >90 mL/min Final   Comment: (NOTE)                          The eGFR has been calculated using the CKD EPI equation.                          This calculation has not been validated in all clinical situations.                          eGFR's persistently <90 mL/min signify possible Chronic Kidney  Disease.  Admission on 05/21/2013, Discharged on 05/21/2013  Component Date Value Ref Range Status  . aPTT 05/21/2013 37  24 - 37 seconds Final   Comment:                                 IF BASELINE aPTT IS ELEVATED,                          SUGGEST PATIENT RISK ASSESSMENT                          BE USED TO DETERMINE APPROPRIATE                          ANTICOAGULANT THERAPY.  . WBC 05/21/2013 10.1  4.0 - 10.5 K/uL Final  . RBC 05/21/2013 4.29  3.87 - 5.11 MIL/uL Final  . Hemoglobin 05/21/2013 12.9  12.0 - 15.0 g/dL Final  . HCT 05/21/2013 38.8  36.0 - 46.0 % Final  . MCV 05/21/2013 90.4  78.0 - 100.0 fL Final  . MCH 05/21/2013 30.1  26.0 - 34.0 pg Final  . MCHC 05/21/2013 33.2  30.0 - 36.0 g/dL Final  . RDW 05/21/2013 15.9* 11.5 - 15.5 % Final  . Platelets 05/21/2013 389  150 - 400 K/uL Final  . Sodium 05/21/2013 139  137 - 147 mEq/L Final  . Potassium 05/21/2013 4.2  3.7 - 5.3 mEq/L Final   HEMOLYZED SPECIMEN, RESULTS MAY BE AFFECTED  . Chloride 05/21/2013 103  96 - 112 mEq/L Final  . CO2 05/21/2013 21  19 - 32 mEq/L Final  . Glucose, Bld 05/21/2013 77  70 - 99 mg/dL Final  . BUN 05/21/2013 24* 6 - 23 mg/dL Final  . Creatinine, Ser 05/21/2013 0.71  0.50 - 1.10 mg/dL Final  . Calcium 05/21/2013 9.1  8.4 - 10.5  mg/dL Final  . Total Protein 05/21/2013 7.3  6.0 - 8.3 g/dL Final  . Albumin 05/21/2013 3.4* 3.5 - 5.2 g/dL Final  . AST 05/21/2013 21  0 - 37 U/L Final   HEMOLYSIS AT THIS LEVEL MAY AFFECT RESULT  . ALT 05/21/2013 10  0 - 35 U/L Final   HEMOLYSIS AT THIS LEVEL MAY AFFECT RESULT  . Alkaline Phosphatase 05/21/2013 89  39 - 117 U/L Final  . Total Bilirubin 05/21/2013 <0.2* 0.3 - 1.2 mg/dL Final  . GFR calc non Af Amer 05/21/2013 >90  >90 mL/min Final  . GFR calc Af Amer 05/21/2013 >90  >90 mL/min Final   Comment: (NOTE)                          The eGFR has been calculated using the CKD EPI equation.                          This calculation has not been validated in all clinical situations.                          eGFR's persistently <90 mL/min signify possible Chronic Kidney                          Disease.  Marland Kitchen Prothrombin Time 05/21/2013 13.3  11.6 -  15.2 seconds Final  . INR 05/21/2013 1.03  0.00 - 1.49 Final  . MRSA, PCR 05/21/2013 POSITIVE* NEGATIVE Final  . Staphylococcus aureus 05/21/2013 POSITIVE* NEGATIVE Final   Comment:                                 The Xpert SA Assay (FDA                          approved for NASAL specimens                          in patients over 62 years of age),                          is one component of                          a comprehensive surveillance                          program.  Test performance has                          been validated by [employer] for patients greater                          than or equal to 47 year old.                          It is not intended                          to diagnose infection nor to                          guide or monitor treatment.  Office Visit on 05/17/2013  Component Date Value Ref Range Status  . WBC 05/17/2013 8.3  4.0 - 10.5 K/uL Final  . RBC 05/17/2013 4.60  3.87 - 5.11 MIL/uL Final  . Hemoglobin 05/17/2013 13.8  12.0 - 15.0 g/dL Final  . HCT 05/17/2013  41.8  36.0 - 46.0 % Final  . MCV 05/17/2013 90.9  78.0 - 100.0 fL Final  . MCH 05/17/2013 30.0  26.0 - 34.0 pg Final  . MCHC 05/17/2013 33.0  30.0 - 36.0 g/dL Final  . RDW 05/17/2013 15.3  11.5 - 15.5 % Final  . Platelets 05/17/2013 429* 150 - 400 K/uL Final  . Neutrophils Relative % 05/17/2013 61  43 - 77 % Final  . Neutro Abs 05/17/2013 5.1  1.7 - 7.7 K/uL Final  . Lymphocytes Relative 05/17/2013 28  12 - 46 % Final  . Lymphs Abs 05/17/2013 2.3  0.7 - 4.0 K/uL Final  . Monocytes Relative 05/17/2013 11  3 - 12 % Final  . Monocytes Absolute 05/17/2013 0.9  0.1 - 1.0 K/uL Final  . Eosinophils Relative 05/17/2013 1  0 - 5 % Final  . Eosinophils Absolute 05/17/2013 0.1  0.0 -  0.7 K/uL Final  . Basophils Relative 05/17/2013 1  0 - 1 % Final  . Basophils Absolute 05/17/2013 0.1  0.0 - 0.1 K/uL Final  . Sodium 05/17/2013 143  137 - 147 mEq/L Final  . Potassium 05/17/2013 4.0  3.7 - 5.3 mEq/L Final  . Chloride 05/17/2013 100  96 - 112 mEq/L Final  . CO2 05/17/2013 28  19 - 32 mEq/L Final  . Glucose, Bld 05/17/2013 93  70 - 99 mg/dL Final  . BUN 05/17/2013 22  6 - 23 mg/dL Final  . Creatinine, Ser 05/17/2013 0.84  0.50 - 1.10 mg/dL Final  . Calcium 05/17/2013 9.9  8.4 - 10.5 mg/dL Final  . Total Protein 05/17/2013 8.1  6.0 - 8.3 g/dL Final  . Albumin 05/17/2013 3.8  3.5 - 5.2 g/dL Final  . AST 05/17/2013 18  0 - 37 U/L Final  . ALT 05/17/2013 10  0 - 35 U/L Final  . Alkaline Phosphatase 05/17/2013 103  39 - 117 U/L Final  . Total Bilirubin 05/17/2013 <0.2* 0.3 - 1.2 mg/dL Final  . GFR calc non Af Amer 05/17/2013 80* >90 mL/min Final  . GFR calc Af Amer 05/17/2013 >90  >90 mL/min Final   Comment: (NOTE)                          The eGFR has been calculated using the CKD EPI equation.                          This calculation has not been validated in all clinical situations.                          eGFR's persistently <90 mL/min signify possible Chronic Kidney                           Disease.  Marland Kitchen LDH 05/17/2013 227  94 - 250 U/L Final  Admission on 05/03/2013, Discharged on 05/03/2013  Component Date Value Ref Range Status  . aPTT 05/03/2013 35  24 - 37 seconds Final  . WBC 05/03/2013 7.6  4.0 - 10.5 K/uL Final  . RBC 05/03/2013 4.31  3.87 - 5.11 MIL/uL Final  . Hemoglobin 05/03/2013 13.0  12.0 - 15.0 g/dL Final  . HCT 05/03/2013 39.1  36.0 - 46.0 % Final  . MCV 05/03/2013 90.7  78.0 - 100.0 fL Final  . MCH 05/03/2013 30.2  26.0 - 34.0 pg Final  . MCHC 05/03/2013 33.2  30.0 - 36.0 g/dL Final  . RDW 05/03/2013 15.0  11.5 - 15.5 % Final  . Platelets 05/03/2013 336  150 - 400 K/uL Final  . Sodium 05/03/2013 145  137 - 147 mEq/L Final  . Potassium 05/03/2013 4.1  3.7 - 5.3 mEq/L Final  . Chloride 05/03/2013 107  96 - 112 mEq/L Final  . CO2 05/03/2013 23  19 - 32 mEq/L Final  . Glucose, Bld 05/03/2013 74  70 - 99 mg/dL Final  . BUN 05/03/2013 15  6 - 23 mg/dL Final  . Creatinine, Ser 05/03/2013 0.69  0.50 - 1.10 mg/dL Final  . Calcium 05/03/2013 9.1  8.4 - 10.5 mg/dL Final  . Total Protein 05/03/2013 6.6  6.0 - 8.3 g/dL Final  . Albumin 05/03/2013 3.1* 3.5 - 5.2 g/dL Final  . AST 05/03/2013 15  0 - 37 U/L  Final  . ALT 05/03/2013 8  0 - 35 U/L Final  . Alkaline Phosphatase 05/03/2013 83  39 - 117 U/L Final  . Total Bilirubin 05/03/2013 <0.2* 0.3 - 1.2 mg/dL Final  . GFR calc non Af Amer 05/03/2013 >90  >90 mL/min Final  . GFR calc Af Amer 05/03/2013 >90  >90 mL/min Final   Comment: (NOTE)                          The eGFR has been calculated using the CKD EPI equation.                          This calculation has not been validated in all clinical situations.                          eGFR's persistently <90 mL/min signify possible Chronic Kidney                          Disease.  Marland Kitchen Prothrombin Time 05/03/2013 13.6  11.6 - 15.2 seconds Final  . INR 05/03/2013 1.06  0.00 - 1.49 Final  Hospital Outpatient Visit on 05/01/2013  Component Date Value Ref Range Status  .  Glucose-Capillary 05/01/2013 83  70 - 99 mg/dL Final    PATHOLOGY: Squamous cell carcinoma. Specimen to be sent to [facility] in order to detect whether or not Hedgehog GL-1 mutation is present.  Urinalysis No results found for this basename: colorurine,  appearanceur,  labspec,  phurine,  glucoseu,  hgbur,  bilirubinur,  ketonesur,  proteinur,  urobilinogen,  nitrite,  leukocytesur    RADIOGRAPHIC STUDIES: Dg Chest 2 View Within Previous 72 Hours.  Films Obtained On Friday Are Acceptable For Monday And Tuesday Cases  05/21/2013   CLINICAL DATA:  Lung cancer, preop for Port-A-Cath placement  EXAM: CHEST  2 VIEW  COMPARISON:  DG CHEST 2 VIEW dated 05/03/2013; CT CHEST W/CM dated 04/16/2013  FINDINGS: Heart size and vascular pattern are normal. No pleural effusions. Right lung is clear. There is fullness of the left hilum with a left perihilar mass measuring about 3.3 cm. Mild known subsegmental atelectasis or scarring in the anterior lingula is stable.  IMPRESSION: No acute findings. Known left hilar adenopathy and left perihilar mass.   Electronically Signed   By: Skipper Cliche M.D.   On: 05/21/2013 07:57   Dg Chest 2 View Within Previous 72 Hours.  Films Obtained On Friday Are Acceptable For Monday And Tuesday Cases  05/03/2013   CLINICAL DATA:  Dyspnea, tobacco use  EXAM: CHEST  2 VIEW  COMPARISON:  NM PET IMAGE INITIAL (PI) SKULL BASE TO THIGH dated 05/01/2013; DG CHEST 2 VIEW dated 04/12/2013  FINDINGS: Again demonstrated is a lobulated left hilar mass. The right hilum is normal in appearance. The lungs are adequately inflated. There is density adjacent to the cardiac apex which is not new but is more conspicuous today. It corresponds to a lingular density seen on the previous PET-CT study. The cardiac silhouette is normal in size. The pulmonary vascularity is not engorged. The mediastinum is normal in width. There is no pleural effusion or pneumothorax.  IMPRESSION: 1. There is persistent left  hilar mass. There is density adjacent to the cardiac apex which may reflect atelectasis. 2. There is no evidence of pneumonia nor CHF.   Electronically Signed  By: David  Martinique   On: 05/03/2013 12:21   Nm Pet Image Initial (pi) Skull Base To Thigh  05/02/2013   CLINICAL DATA:  Initial treatment strategy for lung mass.  EXAM: NUCLEAR MEDICINE PET SKULL BASE TO THIGH  FASTING BLOOD GLUCOSE:  Value: 32m/dl  TECHNIQUE: 16.3 mCi F-18 FDG was injected intravenously. CT data was obtained and used for attenuation correction and anatomic localization only. (This was not acquired as a diagnostic CT examination.) Additional exam technical data entered on technologist worksheet.  COMPARISON:  CT CHEST W/CM dated 04/16/2013  FINDINGS: NECK  No hypermetabolic lymph nodes in the neck. CT images show mucosal thickening and small air-fluid levels in the maxillary sinuses.  CHEST  Low left internal jugular lymph nodes measure up to 6 mm in short axis (CT image 59) and may be mildly hypermetabolic, with an SUV max of 2.5 (PET image 59). Prevascular and AP window adenopathy is contiguous with AP window adenopathy, better measured on 04/16/2013 (2.6 x 3.7 cm on that study), and an SUV max of 14.4. Additional hypermetabolic lymph nodes are seen in the lower left paratracheal station, posterior to the left mainstem bronchus and posterior to the descending thoracic aorta (PET images 82 and 92). Lymph node posterior to the descending thoracic aorta measures 7 mm in short axis (image 92)  Left hilar mass was better measured on 04/16/2013 (2.6 x 3.1 cm on that study) with an SUV max of 13.8. No additional areas of abnormal hypermetabolism in the chest.  CT images show no intervening acute findings. Specifically, no pericardial or pleural effusion. Coronary artery calcification  ABDOMEN/PELVIS  No abnormal hypermetabolism in the liver, adrenal glands, spleen or pancreas. No hypermetabolic lymph nodes. CT images show the liver to be  grossly unremarkable. Small stones appear to layer in the gallbladder. Adrenal glands, kidneys, spleen, pancreas, stomach and bowel are grossly unremarkable. Atherosclerotic calcification of the arterial vasculature without abdominal aortic aneurysm.  SKELETON  No hypermetabolic osseous lesions. Degenerative changes are seen in the spine.  IMPRESSION: 1. Hypermetabolic left hilar mass with hypermetabolic ipsilateral mediastinal adenopathy. Findings are most consistent with primary bronchogenic carcinoma and at least T1b N2 M0 or stage IIIA disease. Questionable left supraclavicular nodal metastasis would upstage the patient to stage IIIB disease. 2. Extensive coronary artery calcification. 3. Cholelithiasis. 4. Small air-fluid levels in the maxillary sinuses.   Electronically Signed   By: MLorin PicketM.D.   On: 05/02/2013 09:49   Dg Chest Port 1 View  05/21/2013   CLINICAL DATA:  Porta catheter insertion P  EXAM: PORTABLE CHEST - 1 VIEW  COMPARISON:  Chest x-ray from the same day at 7:36 a.m.  FINDINGS: There is a new left subclavian porta catheter, tip in the region of the upper SVC. No catheter kink. No pneumothorax.  Left hilar mass as seen previously.  No effusion or edema.  IMPRESSION: New left subclavian porta catheter, tip at the mid SVC. No pneumothorax.   Electronically Signed   By: JJorje GuildM.D.   On: 05/21/2013 10:09   Dg Fluoro Guide Cv Line-no Report  05/21/2013   CLINICAL DATA: porta cath   FLOURO GUIDE CV LINE  Fluoroscopy was utilized by the requesting physician.  No radiographic  interpretation.     ASSESSMENT:  #1. Stage III (([id number] squamous cell carcinoma left lower lobe with mediastinal involvement, to begin combined modality therapy today #2. Chronic obstructive pulmonary disease, still smoking.  #3. Cholelithiasis, asymptomatic.  #4. History of right lung  abscess, status post medical treatment only. This occurred about 3 or 4 years ago and required hospitalization and  intravenous antibiotics. There was no antecedent history of aspiration or loss of consciousness.  #5. Gastroesophageal reflux disease    PLAN:  #1. Carboplatin AUC of 2 plus Taxol 80 mg per meter squared intravenously today. Patient already had radiation therapy this morning. #2. Followup in one week with CBC and BMP.    All questions were answered. The patient knows to call the clinic with any problems, questions or concerns. We can certainly see the patient much sooner if necessary.   I spent 25 minutes counseling the patient face to face. The total time spent in the appointment was 30 minutes.    Doroteo Bradford, MD 05/30/2013 12:35 PM

## 2013-05-30 NOTE — Telephone Encounter (Signed)
[facility] testing submitted to [facility]. Heather Coffey stated that she received the fax.

## 2013-05-31 ENCOUNTER — Telehealth (HOSPITAL_COMMUNITY): Payer: Self-pay

## 2013-05-31 ENCOUNTER — Encounter (HOSPITAL_COMMUNITY): Payer: Self-pay

## 2013-05-31 NOTE — Progress Notes (Unsigned)
24 Hour Chemotherapy Follow up Call  Cynda Familia called at home following administration of carboplatin and taxol chemotherapy.   Unable to reach by telephone and left message for patient to call clinic with update.

## 2013-06-01 NOTE — Telephone Encounter (Signed)
Call back from patient.  States she is doing fine.  Has been taking all medications (Reglan and compazine) as prescribed and has experienced no nausea or vomiting.  Taking liquids well.  Knows to call clinic if problems develop.

## 2013-06-06 ENCOUNTER — Encounter (HOSPITAL_COMMUNITY): Payer: Managed Care, Other (non HMO) | Attending: Hematology and Oncology

## 2013-06-06 VITALS — BP 93/61 | HR 113 | Temp 98.3°F | Resp 18 | Wt 128.0 lb

## 2013-06-06 DIAGNOSIS — C771 Secondary and unspecified malignant neoplasm of intrathoracic lymph nodes: Secondary | ICD-10-CM | POA: Insufficient documentation

## 2013-06-06 DIAGNOSIS — C3410 Malignant neoplasm of upper lobe, unspecified bronchus or lung: Secondary | ICD-10-CM | POA: Insufficient documentation

## 2013-06-06 DIAGNOSIS — Z5111 Encounter for antineoplastic chemotherapy: Secondary | ICD-10-CM

## 2013-06-06 DIAGNOSIS — R590 Localized enlarged lymph nodes: Secondary | ICD-10-CM

## 2013-06-06 DIAGNOSIS — R599 Enlarged lymph nodes, unspecified: Secondary | ICD-10-CM | POA: Insufficient documentation

## 2013-06-06 DIAGNOSIS — C3430 Malignant neoplasm of lower lobe, unspecified bronchus or lung: Secondary | ICD-10-CM

## 2013-06-06 LAB — CBC WITH DIFFERENTIAL/PLATELET
Basophils Absolute: 0 10*3/uL (ref 0.0–0.1)
Basophils Relative: 1 % (ref 0–1)
Eosinophils Absolute: 0.1 10*3/uL (ref 0.0–0.7)
Eosinophils Relative: 2 % (ref 0–5)
HCT: 37.3 % (ref 36.0–46.0)
Hemoglobin: 12.2 g/dL (ref 12.0–15.0)
LYMPHS ABS: 1.1 10*3/uL (ref 0.7–4.0)
Lymphocytes Relative: 19 % (ref 12–46)
MCH: 29.5 pg (ref 26.0–34.0)
MCHC: 32.7 g/dL (ref 30.0–36.0)
MCV: 90.3 fL (ref 78.0–100.0)
Monocytes Absolute: 0.5 10*3/uL (ref 0.1–1.0)
Monocytes Relative: 10 % (ref 3–12)
NEUTROS PCT: 69 % (ref 43–77)
Neutro Abs: 3.8 10*3/uL (ref 1.7–7.7)
PLATELETS: 354 10*3/uL (ref 150–400)
RBC: 4.13 MIL/uL (ref 3.87–5.11)
RDW: 15.5 % (ref 11.5–15.5)
WBC: 5.5 10*3/uL (ref 4.0–10.5)

## 2013-06-06 LAB — BASIC METABOLIC PANEL
BUN: 19 mg/dL (ref 6–23)
CALCIUM: 9.2 mg/dL (ref 8.4–10.5)
CO2: 25 mEq/L (ref 19–32)
Chloride: 104 mEq/L (ref 96–112)
Creatinine, Ser: 0.72 mg/dL (ref 0.50–1.10)
GFR calc non Af Amer: >90 mL/min (ref >90)
GLUCOSE: 99 mg/dL (ref 70–99)
POTASSIUM: 4.4 meq/L (ref 3.7–5.3)
SODIUM: 139 meq/L (ref 137–147)

## 2013-06-06 MED ORDER — SODIUM CHLORIDE 0.9 % IJ SOLN
10.0000 mL | INTRAMUSCULAR | Status: DC | PRN
  Administered 2013-06-06: 10 mL

## 2013-06-06 MED ORDER — FAMOTIDINE IN NACL 20-0.9 MG/50ML-% IV SOLN
20.0000 mg | Freq: Once | INTRAVENOUS | Status: AC
  Administered 2013-06-06: 20 mg via INTRAVENOUS
  Filled 2013-06-06: qty 50

## 2013-06-06 MED ORDER — HEPARIN SOD (PORK) LOCK FLUSH 100 UNIT/ML IV SOLN
500.0000 [IU] | Freq: Once | INTRAVENOUS | Status: AC | PRN
  Administered 2013-06-06: 500 [IU]
  Filled 2013-06-06: qty 5

## 2013-06-06 MED ORDER — SODIUM CHLORIDE 0.9 % IV SOLN
Freq: Once | INTRAVENOUS | Status: AC
  Administered 2013-06-06: 12:00:00 via INTRAVENOUS

## 2013-06-06 MED ORDER — SODIUM CHLORIDE 0.9 % IV SOLN
225.0000 mg | Freq: Once | INTRAVENOUS | Status: AC
  Administered 2013-06-06: 230 mg via INTRAVENOUS
  Filled 2013-06-06: qty 23

## 2013-06-06 MED ORDER — DIPHENHYDRAMINE HCL 50 MG/ML IJ SOLN
50.0000 mg | Freq: Once | INTRAMUSCULAR | Status: AC
  Administered 2013-06-06: 50 mg via INTRAVENOUS
  Filled 2013-06-06: qty 1

## 2013-06-06 MED ORDER — PACLITAXEL CHEMO INJECTION 300 MG/50ML
45.0000 mg/m2 | Freq: Once | INTRAVENOUS | Status: AC
  Administered 2013-06-06: 72 mg via INTRAVENOUS
  Filled 2013-06-06: qty 12

## 2013-06-06 MED ORDER — DEXAMETHASONE SODIUM PHOSPHATE 10 MG/ML IJ SOLN: 20.0000 mg | Freq: Once | INTRAMUSCULAR | Status: DC

## 2013-06-06 MED ORDER — SODIUM CHLORIDE 0.9 % IV SOLN: 16.0000 mg | Freq: Once | INTRAVENOUS | Status: DC

## 2013-06-06 MED ORDER — SODIUM CHLORIDE 0.9 % IV SOLN
Freq: Once | INTRAVENOUS | Status: AC
  Administered 2013-06-06: 8 mg via INTRAVENOUS
  Filled 2013-06-06: qty 8

## 2013-06-06 NOTE — Patient Instructions (Addendum)
[facility] Discharge Instructions for Patients Receiving Chemotherapy  Today you received the following chemotherapy agents carboplatin and taxol  To help prevent nausea and vomiting after your treatment, we encourage you to take your nausea medication as needed. Labs are all normal. Continue to push fluids Make position changes slowly when you are getting up.   If you develop nausea and vomiting that is not controlled by your nausea medication, call the clinic. If it is after clinic hours your family physician or the after hours number for the clinic or go to the Emergency Department.   BELOW ARE SYMPTOMS THAT SHOULD BE REPORTED IMMEDIATELY:  *FEVER GREATER THAN 101.0 F  *CHILLS WITH OR WITHOUT FEVER  NAUSEA AND VOMITING THAT IS NOT CONTROLLED WITH YOUR NAUSEA MEDICATION  *UNUSUAL SHORTNESS OF BREATH  *UNUSUAL BRUISING OR BLEEDING  TENDERNESS IN MOUTH AND THROAT WITH OR WITHOUT PRESENCE OF ULCERS  *URINARY PROBLEMS  *BOWEL PROBLEMS  UNUSUAL RASH Items with * indicate a potential emergency and should be followed up as soon as possible.  One of the nurses will contact you 24 hours after your treatment. Please let the nurse know about any problems that you may have experienced. Feel free to call the clinic you have any questions or concerns. The clinic phone number is [phone].   I have been informed and understand all the instructions given to me. I know to contact the clinic, my physician, or go to the Emergency Department if any problems should occur. I do not have any questions at this time, but understand that I may call the clinic during office hours or the Patient Navigator at [phone] should I have any questions or need assistance in obtaining follow up care.

## 2013-06-06 NOTE — Progress Notes (Signed)
Heather Coffey recieived chemotherapy today, tolerated well without incident, discharged ambulatory with daughter

## 2013-06-07 ENCOUNTER — Ambulatory Visit (HOSPITAL_COMMUNITY): Payer: Managed Care, Other (non HMO)

## 2013-06-08 ENCOUNTER — Ambulatory Visit (HOSPITAL_COMMUNITY): Payer: Managed Care, Other (non HMO)

## 2013-06-08 NOTE — Progress Notes (Signed)
This encounter was created in error - please disregard.

## 2013-06-13 ENCOUNTER — Encounter (HOSPITAL_COMMUNITY): Payer: Self-pay

## 2013-06-13 ENCOUNTER — Encounter (HOSPITAL_BASED_OUTPATIENT_CLINIC_OR_DEPARTMENT_OTHER): Payer: Managed Care, Other (non HMO)

## 2013-06-13 VITALS — BP 121/65 | HR 91 | Temp 98.2°F | Resp 18 | Wt 126.2 lb

## 2013-06-13 DIAGNOSIS — C3410 Malignant neoplasm of upper lobe, unspecified bronchus or lung: Secondary | ICD-10-CM

## 2013-06-13 DIAGNOSIS — R590 Localized enlarged lymph nodes: Secondary | ICD-10-CM

## 2013-06-13 DIAGNOSIS — K8020 Calculus of gallbladder without cholecystitis without obstruction: Secondary | ICD-10-CM

## 2013-06-13 DIAGNOSIS — D72819 Decreased white blood cell count, unspecified: Secondary | ICD-10-CM

## 2013-06-13 DIAGNOSIS — J309 Allergic rhinitis, unspecified: Secondary | ICD-10-CM

## 2013-06-13 DIAGNOSIS — C3430 Malignant neoplasm of lower lobe, unspecified bronchus or lung: Secondary | ICD-10-CM

## 2013-06-13 DIAGNOSIS — R42 Dizziness and giddiness: Secondary | ICD-10-CM

## 2013-06-13 DIAGNOSIS — J449 Chronic obstructive pulmonary disease, unspecified: Secondary | ICD-10-CM

## 2013-06-13 DIAGNOSIS — F17200 Nicotine dependence, unspecified, uncomplicated: Secondary | ICD-10-CM

## 2013-06-13 DIAGNOSIS — C771 Secondary and unspecified malignant neoplasm of intrathoracic lymph nodes: Secondary | ICD-10-CM

## 2013-06-13 DIAGNOSIS — Z5111 Encounter for antineoplastic chemotherapy: Secondary | ICD-10-CM

## 2013-06-13 LAB — BASIC METABOLIC PANEL
BUN: 13 mg/dL (ref 6–23)
CALCIUM: 9.4 mg/dL (ref 8.4–10.5)
CO2: 26 mEq/L (ref 19–32)
Chloride: 103 mEq/L (ref 96–112)
Creatinine, Ser: 0.61 mg/dL (ref 0.50–1.10)
Glucose, Bld: 87 mg/dL (ref 70–99)
Potassium: 3.8 mEq/L (ref 3.7–5.3)
SODIUM: 139 meq/L (ref 137–147)

## 2013-06-13 LAB — CBC WITH DIFFERENTIAL/PLATELET
Basophils Absolute: 0 10*3/uL (ref 0.0–0.1)
Basophils Relative: 1 % (ref 0–1)
EOS ABS: 0.1 10*3/uL (ref 0.0–0.7)
Eosinophils Relative: 3 % (ref 0–5)
HCT: 37 % (ref 36.0–46.0)
Hemoglobin: 12.4 g/dL (ref 12.0–15.0)
Lymphocytes Relative: 18 % (ref 12–46)
Lymphs Abs: 0.6 10*3/uL — ABNORMAL LOW (ref 0.7–4.0)
MCH: 30.2 pg (ref 26.0–34.0)
MCHC: 33.5 g/dL (ref 30.0–36.0)
MCV: 90 fL (ref 78.0–100.0)
Monocytes Absolute: 0.5 10*3/uL (ref 0.1–1.0)
Monocytes Relative: 15 % — ABNORMAL HIGH (ref 3–12)
NEUTROS PCT: 63 % (ref 43–77)
Neutro Abs: 2.2 10*3/uL (ref 1.7–7.7)
PLATELETS: 290 10*3/uL (ref 150–400)
RBC: 4.11 MIL/uL (ref 3.87–5.11)
RDW: 15.3 % (ref 11.5–15.5)
WBC: 3.5 10*3/uL — ABNORMAL LOW (ref 4.0–10.5)

## 2013-06-13 MED ORDER — SODIUM CHLORIDE 0.9 % IV SOLN: 16.0000 mg | Freq: Once | INTRAVENOUS | Status: DC

## 2013-06-13 MED ORDER — PACLITAXEL CHEMO INJECTION 300 MG/50ML
45.0000 mg/m2 | Freq: Once | INTRAVENOUS | Status: AC
  Administered 2013-06-13: 72 mg via INTRAVENOUS
  Filled 2013-06-13: qty 12

## 2013-06-13 MED ORDER — DIPHENHYDRAMINE HCL 50 MG/ML IJ SOLN
INTRAMUSCULAR | Status: AC
  Filled 2013-06-13: qty 1

## 2013-06-13 MED ORDER — FAMOTIDINE IN NACL 20-0.9 MG/50ML-% IV SOLN
20.0000 mg | Freq: Once | INTRAVENOUS | Status: AC
  Administered 2013-06-13: 20 mg via INTRAVENOUS

## 2013-06-13 MED ORDER — DIPHENHYDRAMINE HCL 50 MG/ML IJ SOLN
50.0000 mg | Freq: Once | INTRAMUSCULAR | Status: AC
  Administered 2013-06-13: 50 mg via INTRAVENOUS

## 2013-06-13 MED ORDER — HEPARIN SOD (PORK) LOCK FLUSH 100 UNIT/ML IV SOLN
500.0000 [IU] | Freq: Once | INTRAVENOUS | Status: AC | PRN
  Administered 2013-06-13: 500 [IU]
  Filled 2013-06-13: qty 5

## 2013-06-13 MED ORDER — SODIUM CHLORIDE 0.9 % IV SOLN
225.0000 mg | Freq: Once | INTRAVENOUS | Status: AC
  Administered 2013-06-13: 230 mg via INTRAVENOUS
  Filled 2013-06-13: qty 23

## 2013-06-13 MED ORDER — SODIUM CHLORIDE 0.9 % IV SOLN
Freq: Once | INTRAVENOUS | Status: AC
  Administered 2013-06-13: 12:00:00 via INTRAVENOUS

## 2013-06-13 MED ORDER — DEXAMETHASONE SODIUM PHOSPHATE 10 MG/ML IJ SOLN: 20.0000 mg | Freq: Once | INTRAMUSCULAR | Status: DC

## 2013-06-13 MED ORDER — SODIUM CHLORIDE 0.9 % IJ SOLN
10.0000 mL | INTRAMUSCULAR | Status: DC | PRN
  Administered 2013-06-13: 10 mL

## 2013-06-13 MED ORDER — FAMOTIDINE IN NACL 20-0.9 MG/50ML-% IV SOLN
INTRAVENOUS | Status: AC
  Filled 2013-06-13: qty 50

## 2013-06-13 MED ORDER — SODIUM CHLORIDE 0.9 % IV SOLN
Freq: Once | INTRAVENOUS | Status: AC
  Administered 2013-06-13: 16 mg via INTRAVENOUS
  Filled 2013-06-13: qty 8

## 2013-06-13 NOTE — Progress Notes (Signed)
Tolerated well

## 2013-06-13 NOTE — Progress Notes (Signed)
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 Lung Cancer    Squamous cell, stage IIIB    CURRENT THERAPY: Combined modality external beam radiotherapy plus weekly carboplatin/Taxol cycle #3 today.  INTERVAL HISTORY: Heather Coffey 53 y.o. 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  She has developed minimal dysphagia and has magic mouthwash to relieve that symptom. Dizziness has become worse over the past 2 weeks but is not unusual in the springtime when the patient does develop nasal symptoms with cloggine of the ears. She is taking Sudafed at this time. She denies any fever, sore throat, earache, with nonproductive cough with no hemoptysis. She denies any epistaxis, melena, hematochezia, hematuria, lower extremity swelling or redness, or peripheral paresthesias.  MEDICAL HISTORY: Past Medical History  Diagnosis Date  . Arthritis   . Anxiety   . Hyperlipidemia   . Tobacco abuse   . Shortness of breath     Hx: of breath with exertion  . Depression   . GERD (gastroesophageal reflux disease)   . H/O hiatal hernia   . 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    ALLERGIES:  is allergic to augmentin; codeine; and tetracyclines & related.  MEDICATIONS: has a current medication list which  includes the following prescription(s): albuterol, alprazolam, aspirin ec, cyclobenzaprine, first-dukes mouthwash, esomeprazole, fluoxetine, fluticasone, fluticasone-salmeterol, guaifenesin, lidocaine-prilocaine, metoclopramide, multivitamin, pravastatin, prochlorperazine, sucralfate, tramadol, triamcinolone cream, loratadine-pseudoephedrine, and montelukast, and the following Facility-Administered Medications: CARBOplatin (PARAPLATIN) 230 mg in sodium chloride 0.9 % 100 mL chemo infusion, heparin lock flush, PACLitaxel (TAXOL) 72 mg in dextrose 5 % 250 mL chemo infusion (</= 61m/m2), and sodium chloride.  SURGICAL HISTORY:  Past Surgical History  Procedure Laterality Date  . Tubal ligation    . Breast lumpectomy      Hx: of  . Video bronchoscopy with endobronchial ultrasound N/A 05/03/2013    Procedure: VIDEO BRONCHOSCOPY WITH ENDOBRONCHIAL ULTRASOUND;  Surgeon: SMelrose Nakayama MD;  Location: [facility]  Service: Thoracic;  Laterality: N/A;  . Abdominal hysterectomy      still has ovaries per pt  . Nasal septum surgery    . Portacath placement Left 05/21/2013    Procedure: INSERTION PORT-A-CATH;  Surgeon: SMelrose Nakayama MD;  [facility];  Service: Thoracic;  Laterality: Left;    FAMILY HISTORY: family history includes COPD in her brother, father, mother, and sister; Cancer in her sister; Diabetes in her sister; Hypercholesterolemia in her mother.  SOCIAL HISTORY:  reports that she has been smoking Cigarettes.  She has a 17.5 pack-year smoking history. She has never used smokeless tobacco. She reports that she does not drink alcohol or use illicit drugs.  REVIEW OF SYSTEMS:  Other than that discussed above is noncontributory.  PHYSICAL EXAMINATION: ECOG PERFORMANCE STATUS: 1 - Symptomatic but completely ambulatory  There were no vitals taken for this visit.  GENERAL:alert, no distress and  comfortable SKIN: skin color, texture, turgor are normal, no rashes or significant  lesions EYES: PERLA; Conjunctiva are pink and non-injected, sclera clear SINUSES: Bilateral rhinorrhea. OROPHARYNX:no exudate, no erythema on lips, buccal mucosa, or tongue. NECK: supple, thyroid normal size, non-tender, without nodularity. No masses CHEST: Increased AP diameter with the lung fields bilaterally. No dullness to percussion. No friction rub. LYMPH:  no palpable lymphadenopathy in the cervical, axillary or inguinal LUNGS: clear to auscultation and percussion with normal breathing effort HEART: regular rate & rhythm and no murmurs. ABDOMEN:abdomen soft, non-tender and normal bowel sounds MUSCULOSKELETAL:no cyanosis of digits and no clubbing. Range of motion normal.  NEURO: alert & oriented x 3 with fluent speech, no focal motor/sensory deficits   LABORATORY DATA: Infusion on 06/13/2013  Component Date Value Ref Range Status  . WBC 06/13/2013 3.5* 4.0 - 10.5 K/uL Final  . RBC 06/13/2013 4.11  3.87 - 5.11 MIL/uL Final  . Hemoglobin 06/13/2013 12.4  12.0 - 15.0 g/dL Final  . HCT 06/13/2013 37.0  36.0 - 46.0 % Final  . MCV 06/13/2013 90.0  78.0 - 100.0 fL Final  . MCH 06/13/2013 30.2  26.0 - 34.0 pg Final  . MCHC 06/13/2013 33.5  30.0 - 36.0 g/dL Final  . RDW 06/13/2013 15.3  11.5 - 15.5 % Final  . Platelets 06/13/2013 290  150 - 400 K/uL Final  . Neutrophils Relative % 06/13/2013 63  43 - 77 % Final  . Neutro Abs 06/13/2013 2.2  1.7 - 7.7 K/uL Final  . Lymphocytes Relative 06/13/2013 18  12 - 46 % Final  . Lymphs Abs 06/13/2013 0.6* 0.7 - 4.0 K/uL Final  . Monocytes Relative 06/13/2013 15* 3 - 12 % Final  . Monocytes Absolute 06/13/2013 0.5  0.1 - 1.0 K/uL Final  . Eosinophils Relative 06/13/2013 3  0 - 5 % Final  . Eosinophils Absolute 06/13/2013 0.1  0.0 - 0.7 K/uL Final  . Basophils Relative 06/13/2013 1  0 - 1 % Final  . Basophils Absolute 06/13/2013 0.0  0.0 - 0.1 K/uL Final  . Sodium 06/13/2013 139  137 - 147 mEq/L Final  . Potassium 06/13/2013 3.8  3.7 - 5.3 mEq/L  Final  . Chloride 06/13/2013 103  96 - 112 mEq/L Final  . CO2 06/13/2013 26  19 - 32 mEq/L Final  . Glucose, Bld 06/13/2013 87  70 - 99 mg/dL Final  . BUN 06/13/2013 13  6 - 23 mg/dL Final  . Creatinine, Ser 06/13/2013 0.61  0.50 - 1.10 mg/dL Final  . Calcium 06/13/2013 9.4  8.4 - 10.5 mg/dL Final  . GFR calc non Af Amer 06/13/2013 >90  >90 mL/min Final  . GFR calc Af Amer 06/13/2013 >90  >90 mL/min Final   Comment: (NOTE)                          The eGFR has been calculated using the CKD EPI equation.                          This calculation has not been validated in all clinical situations.                          eGFR's persistently <90 mL/min signify possible Chronic Kidney  Disease.  Infusion on 06/06/2013  Component Date Value Ref Range Status  . WBC 06/06/2013 5.5  4.0 - 10.5 K/uL Final  . RBC 06/06/2013 4.13  3.87 - 5.11 MIL/uL Final  . Hemoglobin 06/06/2013 12.2  12.0 - 15.0 g/dL Final  . HCT 06/06/2013 37.3  36.0 - 46.0 % Final  . MCV 06/06/2013 90.3  78.0 - 100.0 fL Final  . MCH 06/06/2013 29.5  26.0 - 34.0 pg Final  . MCHC 06/06/2013 32.7  30.0 - 36.0 g/dL Final  . RDW 06/06/2013 15.5  11.5 - 15.5 % Final  . Platelets 06/06/2013 354  150 - 400 K/uL Final  . Neutrophils Relative % 06/06/2013 69  43 - 77 % Final  . Neutro Abs 06/06/2013 3.8  1.7 - 7.7 K/uL Final  . Lymphocytes Relative 06/06/2013 19  12 - 46 % Final  . Lymphs Abs 06/06/2013 1.1  0.7 - 4.0 K/uL Final  . Monocytes Relative 06/06/2013 10  3 - 12 % Final  . Monocytes Absolute 06/06/2013 0.5  0.1 - 1.0 K/uL Final  . Eosinophils Relative 06/06/2013 2  0 - 5 % Final  . Eosinophils Absolute 06/06/2013 0.1  0.0 - 0.7 K/uL Final  . Basophils Relative 06/06/2013 1  0 - 1 % Final  . Basophils Absolute 06/06/2013 0.0  0.0 - 0.1 K/uL Final  . Sodium 06/06/2013 139  137 - 147 mEq/L Final  . Potassium 06/06/2013 4.4  3.7 - 5.3 mEq/L Final  . Chloride 06/06/2013 104  96 - 112 mEq/L Final  .  CO2 06/06/2013 25  19 - 32 mEq/L Final  . Glucose, Bld 06/06/2013 99  70 - 99 mg/dL Final  . BUN 06/06/2013 19  6 - 23 mg/dL Final  . Creatinine, Ser 06/06/2013 0.72  0.50 - 1.10 mg/dL Final  . Calcium 06/06/2013 9.2  8.4 - 10.5 mg/dL Final  . GFR calc non Af Amer 06/06/2013 >90  >90 mL/min Final  . GFR calc Af Amer 06/06/2013 >90  >90 mL/min Final   Comment: (NOTE)                          The eGFR has been calculated using the CKD EPI equation.                          This calculation has not been validated in all clinical situations.                          eGFR's persistently <90 mL/min signify possible Chronic Kidney                          Disease.  Infusion on 05/30/2013  Component Date Value Ref Range Status  . WBC 05/30/2013 6.2  4.0 - 10.5 K/uL Final  . RBC 05/30/2013 4.33  3.87 - 5.11 MIL/uL Final  . Hemoglobin 05/30/2013 12.5  12.0 - 15.0 g/dL Final  . HCT 05/30/2013 38.9  36.0 - 46.0 % Final  . MCV 05/30/2013 89.8  78.0 - 100.0 fL Final  . MCH 05/30/2013 28.9  26.0 - 34.0 pg Final  . MCHC 05/30/2013 32.1  30.0 - 36.0 g/dL Final  . RDW 05/30/2013 16.0* 11.5 - 15.5 % Final  . Platelets 05/30/2013 351  150 - 400 K/uL Final  . Neutrophils Relative % 05/30/2013 59  43 -  77 % Final  . Neutro Abs 05/30/2013 3.7  1.7 - 7.7 K/uL Final  . Lymphocytes Relative 05/30/2013 26  12 - 46 % Final  . Lymphs Abs 05/30/2013 1.6  0.7 - 4.0 K/uL Final  . Monocytes Relative 05/30/2013 12  3 - 12 % Final  . Monocytes Absolute 05/30/2013 0.8  0.1 - 1.0 K/uL Final  . Eosinophils Relative 05/30/2013 2  0 - 5 % Final  . Eosinophils Absolute 05/30/2013 0.1  0.0 - 0.7 K/uL Final  . Basophils Relative 05/30/2013 1  0 - 1 % Final  . Basophils Absolute 05/30/2013 0.0  0.0 - 0.1 K/uL Final  . Sodium 05/30/2013 141  137 - 147 mEq/L Final  . Potassium 05/30/2013 3.9  3.7 - 5.3 mEq/L Final  . Chloride 05/30/2013 103  96 - 112 mEq/L Final  . CO2 05/30/2013 26  19 - 32 mEq/L Final  . Glucose, Bld  05/30/2013 117* 70 - 99 mg/dL Final  . BUN 05/30/2013 19  6 - 23 mg/dL Final  . Creatinine, Ser 05/30/2013 0.77  0.50 - 1.10 mg/dL Final  . Calcium 05/30/2013 9.2  8.4 - 10.5 mg/dL Final  . Total Protein 05/30/2013 7.1  6.0 - 8.3 g/dL Final  . Albumin 05/30/2013 3.4* 3.5 - 5.2 g/dL Final  . AST 05/30/2013 15  0 - 37 U/L Final  . ALT 05/30/2013 9  0 - 35 U/L Final  . Alkaline Phosphatase 05/30/2013 86  39 - 117 U/L Final  . Total Bilirubin 05/30/2013 <0.2* 0.3 - 1.2 mg/dL Final  . GFR calc non Af Amer 05/30/2013 >90  >90 mL/min Final  . GFR calc Af Amer 05/30/2013 >90  >90 mL/min Final   Comment: (NOTE)                          The eGFR has been calculated using the CKD EPI equation.                          This calculation has not been validated in all clinical situations.                          eGFR's persistently <90 mL/min signify possible Chronic Kidney                          Disease.  Admission on 05/21/2013, Discharged on 05/21/2013  Component Date Value Ref Range Status  . aPTT 05/21/2013 37  24 - 37 seconds Final   Comment:                                 IF BASELINE aPTT IS ELEVATED,                          SUGGEST PATIENT RISK ASSESSMENT                          BE USED TO DETERMINE APPROPRIATE                          ANTICOAGULANT THERAPY.  . WBC 05/21/2013 10.1  4.0 - 10.5 K/uL Final  . RBC 05/21/2013 4.29  3.87 - 5.11 MIL/uL Final  .  Hemoglobin 05/21/2013 12.9  12.0 - 15.0 g/dL Final  . HCT 05/21/2013 38.8  36.0 - 46.0 % Final  . MCV 05/21/2013 90.4  78.0 - 100.0 fL Final  . MCH 05/21/2013 30.1  26.0 - 34.0 pg Final  . MCHC 05/21/2013 33.2  30.0 - 36.0 g/dL Final  . RDW 05/21/2013 15.9* 11.5 - 15.5 % Final  . Platelets 05/21/2013 389  150 - 400 K/uL Final  . Sodium 05/21/2013 139  137 - 147 mEq/L Final  . Potassium 05/21/2013 4.2  3.7 - 5.3 mEq/L Final   HEMOLYZED SPECIMEN, RESULTS MAY BE AFFECTED  . Chloride 05/21/2013 103  96 - 112 mEq/L Final  . CO2  05/21/2013 21  19 - 32 mEq/L Final  . Glucose, Bld 05/21/2013 77  70 - 99 mg/dL Final  . BUN 05/21/2013 24* 6 - 23 mg/dL Final  . Creatinine, Ser 05/21/2013 0.71  0.50 - 1.10 mg/dL Final  . Calcium 05/21/2013 9.1  8.4 - 10.5 mg/dL Final  . Total Protein 05/21/2013 7.3  6.0 - 8.3 g/dL Final  . Albumin 05/21/2013 3.4* 3.5 - 5.2 g/dL Final  . AST 05/21/2013 21  0 - 37 U/L Final   HEMOLYSIS AT THIS LEVEL MAY AFFECT RESULT  . ALT 05/21/2013 10  0 - 35 U/L Final   HEMOLYSIS AT THIS LEVEL MAY AFFECT RESULT  . Alkaline Phosphatase 05/21/2013 89  39 - 117 U/L Final  . Total Bilirubin 05/21/2013 <0.2* 0.3 - 1.2 mg/dL Final  . GFR calc non Af Amer 05/21/2013 >90  >90 mL/min Final  . GFR calc Af Amer 05/21/2013 >90  >90 mL/min Final   Comment: (NOTE)                          The eGFR has been calculated using the CKD EPI equation.                          This calculation has not been validated in all clinical situations.                          eGFR's persistently <90 mL/min signify possible Chronic Kidney                          Disease.  Marland Kitchen Prothrombin Time 05/21/2013 13.3  11.6 - 15.2 seconds Final  . INR 05/21/2013 1.03  0.00 - 1.49 Final  . MRSA, PCR 05/21/2013 POSITIVE* NEGATIVE Final  . Staphylococcus aureus 05/21/2013 POSITIVE* NEGATIVE Final   Comment:                                 The Xpert SA Assay (FDA                          approved for NASAL specimens                          in patients over 13 years of age),                          is one component of  a comprehensive surveillance                          program.  Test performance has                          been validated by [facility] for patients greater                          than or equal to 33 year old.                          It is not intended                          to diagnose infection nor to                          guide or monitor treatment.  Office  Visit on 05/17/2013  Component Date Value Ref Range Status  . WBC 05/17/2013 8.3  4.0 - 10.5 K/uL Final  . RBC 05/17/2013 4.60  3.87 - 5.11 MIL/uL Final  . Hemoglobin 05/17/2013 13.8  12.0 - 15.0 g/dL Final  . HCT 05/17/2013 41.8  36.0 - 46.0 % Final  . MCV 05/17/2013 90.9  78.0 - 100.0 fL Final  . MCH 05/17/2013 30.0  26.0 - 34.0 pg Final  . MCHC 05/17/2013 33.0  30.0 - 36.0 g/dL Final  . RDW 05/17/2013 15.3  11.5 - 15.5 % Final  . Platelets 05/17/2013 429* 150 - 400 K/uL Final  . Neutrophils Relative % 05/17/2013 61  43 - 77 % Final  . Neutro Abs 05/17/2013 5.1  1.7 - 7.7 K/uL Final  . Lymphocytes Relative 05/17/2013 28  12 - 46 % Final  . Lymphs Abs 05/17/2013 2.3  0.7 - 4.0 K/uL Final  . Monocytes Relative 05/17/2013 11  3 - 12 % Final  . Monocytes Absolute 05/17/2013 0.9  0.1 - 1.0 K/uL Final  . Eosinophils Relative 05/17/2013 1  0 - 5 % Final  . Eosinophils Absolute 05/17/2013 0.1  0.0 - 0.7 K/uL Final  . Basophils Relative 05/17/2013 1  0 - 1 % Final  . Basophils Absolute 05/17/2013 0.1  0.0 - 0.1 K/uL Final  . Sodium 05/17/2013 143  137 - 147 mEq/L Final  . Potassium 05/17/2013 4.0  3.7 - 5.3 mEq/L Final  . Chloride 05/17/2013 100  96 - 112 mEq/L Final  . CO2 05/17/2013 28  19 - 32 mEq/L Final  . Glucose, Bld 05/17/2013 93  70 - 99 mg/dL Final  . BUN 05/17/2013 22  6 - 23 mg/dL Final  . Creatinine, Ser 05/17/2013 0.84  0.50 - 1.10 mg/dL Final  . Calcium 05/17/2013 9.9  8.4 - 10.5 mg/dL Final  . Total Protein 05/17/2013 8.1  6.0 - 8.3 g/dL Final  . Albumin 05/17/2013 3.8  3.5 - 5.2 g/dL Final  . AST 05/17/2013 18  0 - 37 U/L Final  . ALT 05/17/2013 10  0 - 35 U/L Final  . Alkaline Phosphatase 05/17/2013 103  39 - 117 U/L Final  . Total Bilirubin 05/17/2013 <0.2* 0.3 - 1.2 mg/dL  Final  . GFR calc non Af Amer 05/17/2013 80* >90 mL/min Final  . GFR calc Af Amer 05/17/2013 >90  >90 mL/min Final   Comment: (NOTE)                          The eGFR has been calculated using the  CKD EPI equation.                          This calculation has not been validated in all clinical situations.                          eGFR's persistently <90 mL/min signify possible Chronic Kidney                          Disease.  Marland Kitchen LDH 05/17/2013 227  94 - 250 U/L Final    PATHOLOGY: Unfortunately not enough tissue was available to [employer] [facility] analysis  Urinalysis No results found for this basename: colorurine,  appearanceur,  labspec,  phurine,  glucoseu,  hgbur,  bilirubinur,  ketonesur,  proteinur,  urobilinogen,  nitrite,  leukocytesur    RADIOGRAPHIC STUDIES: Dg Chest 2 View Within Previous 72 Hours.  Films Obtained On Friday Are Acceptable For Monday And Tuesday Cases  05/21/2013   CLINICAL DATA:  Lung cancer, preop for Port-A-Cath placement  EXAM: CHEST  2 VIEW  COMPARISON:  DG CHEST 2 VIEW dated 05/03/2013; CT CHEST W/CM dated 04/16/2013  FINDINGS: Heart size and vascular pattern are normal. No pleural effusions. Right lung is clear. There is fullness of the left hilum with a left perihilar mass measuring about 3.3 cm. Mild known subsegmental atelectasis or scarring in the anterior lingula is stable.  IMPRESSION: No acute findings. Known left hilar adenopathy and left perihilar mass.   Electronically Signed   By: Skipper Cliche M.D.   On: 05/21/2013 07:57   Dg Chest Port 1 View  05/21/2013   CLINICAL DATA:  Porta catheter insertion P  EXAM: PORTABLE CHEST - 1 VIEW  COMPARISON:  Chest x-ray from the same day at 7:36 a.m.  FINDINGS: There is a new left subclavian porta catheter, tip in the region of the upper SVC. No catheter kink. No pneumothorax.  Left hilar mass as seen previously.  No effusion or edema.  IMPRESSION: New left subclavian porta catheter, tip at the mid SVC. No pneumothorax.   Electronically Signed   By: Jorje Guild M.D.   On: 05/21/2013 10:09   Dg Fluoro Guide Cv Line-no Report  05/21/2013   CLINICAL DATA: porta cath   FLOURO GUIDE CV LINE  Fluoroscopy was  utilized by the requesting physician.  No radiographic  interpretation.     ASSESSMENT:  #1. Stage III (T1 B. N2 M0) squamous cell carcinoma left lower lobe with mediastinal involvement, tolerating combined modality well except for esophagitis. #2. Dizziness secondary to symptomatic allergic rhinitis. #3. Chronic obstructive pulmonary disease, unfortunately still smoking. #4. Cholelithiasis, asymptomatic. #5. History of right lung abscess, status post medical treatment only occurring about 3 or 4 years ago requiring hospitalization and intravenous antibiotics with no antecedent history of aspiration or loss of consciousness. #6. Gastroesophageal reflux disease, controlled. #7. Mild leukopenia secondary to combined modality therapy.   PLAN:  #1. Cycle #3 of weekly carboplatin/Taxol today along with daily radiation. #2. Use Magic mouthwash prior to each  attempt to eat. #3. Claritin-D 24 one daily to help with dizziness and nasal symptoms. #4. Continue Flonase inhaler as previously. #5. Followup in one week with CBC, metabolic profile, chemotherapy, and office visit.   All questions were answered. The patient knows to call the clinic with any problems, questions or concerns. We can certainly see the patient much sooner if necessary.   I spent 25 minutes counseling the patient face to face. The total time spent in the appointment was 30 minutes.    Doroteo Bradford, MD 06/13/2013 1:11 PM

## 2013-06-15 ENCOUNTER — Encounter (HOSPITAL_BASED_OUTPATIENT_CLINIC_OR_DEPARTMENT_OTHER): Payer: Managed Care, Other (non HMO)

## 2013-06-15 DIAGNOSIS — C3410 Malignant neoplasm of upper lobe, unspecified bronchus or lung: Secondary | ICD-10-CM

## 2013-06-15 DIAGNOSIS — Z452 Encounter for adjustment and management of vascular access device: Secondary | ICD-10-CM

## 2013-06-15 MED ORDER — HEPARIN SOD (PORK) LOCK FLUSH 100 UNIT/ML IV SOLN
INTRAVENOUS | Status: AC
  Filled 2013-06-15: qty 5

## 2013-06-15 MED ORDER — SODIUM CHLORIDE 0.9 % IJ SOLN
10.0000 mL | Freq: Once | INTRAMUSCULAR | Status: AC
  Administered 2013-06-15: 10 mL via INTRAVENOUS

## 2013-06-15 MED ORDER — HEPARIN SOD (PORK) LOCK FLUSH 100 UNIT/ML IV SOLN
500.0000 [IU] | Freq: Once | INTRAVENOUS | Status: AC
  Administered 2013-06-15: 500 [IU] via INTRAVENOUS

## 2013-06-15 NOTE — Progress Notes (Signed)
Heather Coffey presented for Portacath access and flush. Proper placement of portacath confirmed by CXR. Portacath located lt chest wall accessed with  H 20 needle. Good blood return present. Portacath flushed with 52ml NS and 500U/86ml Heparin and needle removed intact. Procedure without incident. Patient tolerated procedure well.  Port flushed to check for blood return and patency since patient was complaining of puffiness at left axilla area. Patient denied pain or burning when flushing port. Patient said it felt like a "wire" was in her left axilla area. Another nurse as well as Gershon Mussel looked at patient's port, chest, and left axilla area. Port had excellent blood return and flushed with ease. Pt instructed to let us know if this did not begin to feel better. Pt verbalized understanding.

## 2013-06-19 NOTE — Progress Notes (Signed)
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    Lung cancer, lingula   04/29/2013 Initial Diagnosis Squamous cell carcinoma   05/30/2013 -  Chemotherapy Carboplatin/Paclitaxel weekly in combination with radiation therapy    Radiation Therapy    I personally reviewed and went over laboratory results with the patient.  The results are noted within this dictation.  Patient reports chest discomfort that occurs with swallowing.  This is likely secondary to radiation.  She has seen Dr. Isidore Moos regarding this the other day and she prescribed [employer]'s Mouthwash and liquid pain medication (Hydrocodone).  This is absolutely appropriate and we will help manage this side effect of therapy the best we can.  She is educated that this discomfort is expected considering her radiation fields.   She otherwise denies any complaints and oncologic ROS questioning is negative.    Past Medical History  Diagnosis Date  . Arthritis   . Anxiety   . Hyperlipidemia   . Tobacco abuse   . Shortness of breath     Hx: of breath with exertion  . Depression   . GERD (gastroesophageal reflux disease)   . H/O hiatal hernia   . 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     is allergic to augmentin; codeine; and tetracyclines & related.  [name] does not currently have medications on file.  Past Surgical History  Procedure Laterality Date  . Tubal ligation     . Breast lumpectomy      Hx: of  . Video bronchoscopy with endobronchial ultrasound N/A 05/03/2013    Procedure: VIDEO BRONCHOSCOPY WITH ENDOBRONCHIAL ULTRASOUND;  Surgeon: Melrose Nakayama, MD;  Location: [facility];  Service: Thoracic;  Laterality: N/A;  . Abdominal hysterectomy      still has ovaries per pt  . Nasal septum surgery    . Portacath placement Left 05/21/2013    Procedure: INSERTION PORT-A-CATH;  Surgeon: Melrose Nakayama, MD;  Location: [facility];  Service: Thoracic;  Laterality: Left;    Denies any headaches, dizziness, double vision, fevers, chills, night sweats, nausea, vomiting, diarrhea, constipation, chest pain, heart palpitations, shortness of breath, blood in stool, black tarry stool, urinary pain, urinary burning, urinary frequency, hematuria.   PHYSICAL EXAMINATION  ECOG PERFORMANCE STATUS: 1 - Symptomatic but completely ambulatory  There were no vitals filed for this visit.  GENERAL:alert, no distress, well nourished, well developed, comfortable, cooperative, smiling and appears older than stated age and chronically ill-appearing, in chemo-bed receiving chemotherapy. SKIN: skin color, texture, turgor are normal, no rashes or significant lesions HEAD: Normocephalic, No masses, lesions, tenderness or abnormalities EYES: normal, PERRLA, EOMI, Conjunctiva are pink and non-injected EARS: External ears normal OROPHARYNX:lips, buccal mucosa, and tongue normal and mucous membranes are moist  NECK: supple, no adenopathy, trachea midline LYMPH:  no palpable lymphadenopathy, no hepatosplenomegaly BREAST:not examined LUNGS: clear to auscultation , decreased breath sounds throughout, rhonchi throughout initially noted, but cleared with deep cough. HEART: regular rate & rhythm, no murmurs and no  gallops ABDOMEN:abdomen soft, non-tender and normal bowel sounds BACK: Back symmetric, no curvature. EXTREMITIES:less then 2 second capillary refill, no skin discoloration, no  cyanosis  NEURO: alert & oriented x 3 with fluent speech, no focal motor/sensory deficits   LABORATORY DATA: CBC    Component Value Date/Time   WBC 3.0* 06/20/2013 1100   RBC 3.69* 06/20/2013 1100   HGB 11.4* 06/20/2013 1100   HCT 33.8* 06/20/2013 1100   PLT 209 06/20/2013 1100   MCV 91.6 06/20/2013 1100   MCH 30.9 06/20/2013 1100   MCHC 33.7 06/20/2013 1100   RDW 15.6* 06/20/2013 1100   LYMPHSABS 0.7 06/20/2013 1100   MONOABS 0.5 06/20/2013 1100   EOSABS 0.1 06/20/2013 1100   BASOSABS 0.0 06/20/2013 1100      Chemistry      Component Value Date/Time   NA 138 06/20/2013 1100   K 3.9 06/20/2013 1100   CL 102 06/20/2013 1100   CO2 26 06/20/2013 1100   BUN 11 06/20/2013 1100   CREATININE 0.67 06/20/2013 1100      Component Value Date/Time   CALCIUM 9.2 06/20/2013 1100   ALKPHOS 86 05/30/2013 0935   AST 15 05/30/2013 0935   ALT 9 05/30/2013 0935   BILITOT <0.2* 05/30/2013 0935        ASSESSMENT:  1. Stage IIIB squamous cell carcinoma of the lung with a previous history of lung abscess.  On concomitant chemoradiation with carboplatin/paclitaxel weekly. 2. Chronic obstructive pulmonary disease, unfortunately still smoking. 3. History of right lung abscess, status post medical treatment only occurring about 3 or 4 years ago requiring hospitalization and intravenous antibiotics with no antecedent history of aspiration or loss of consciousness. 4. Gastroesophageal reflux disease, controlled 5. Radiation-induced chest discomfort, on liquid pain medication  Patient Active Problem List   Diagnosis Date Noted  . Lung cancer, lingula 05/17/2013  . 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 Chronic obstructive pulmonary disease 05/17/2013  . Cholelithiasis 05/17/2013  . Abscess of lung(513.0), history of 05/17/2013  . GERD (gastroesophageal reflux disease) 05/17/2013     PLAN:  1. I personally reviewed and went over laboratory results with the patient.  The results are noted within  this dictation. 2. Pre-chemo labs: CBC diff, BMET 3. Continue with liquid pain medication prescribed by Dr. Isidore Moos 4. Continue with Duke's Mouthwash 5. Continue with chemoradiation as planned.  6. Return for follow-up as scheduled.    THERAPY PLAN:  We will continue with concomitant chemoradiation and manage toxicities as they arise.   Following completion of chemoradiation, she will need to be rescanned/restaged and future treatment options will need to be discussed.   All questions were answered. The patient knows to call the clinic with any problems, questions or concerns. We can certainly see the patient much sooner if necessary.  Patient and plan discussed with Dr. Farrel Gobble and he is in agreement with the aforementioned.   KEFALAS,THOMAS 06/20/2013

## 2013-06-20 ENCOUNTER — Ambulatory Visit (HOSPITAL_COMMUNITY): Payer: Managed Care, Other (non HMO)

## 2013-06-20 ENCOUNTER — Encounter (HOSPITAL_BASED_OUTPATIENT_CLINIC_OR_DEPARTMENT_OTHER): Payer: Managed Care, Other (non HMO)

## 2013-06-20 ENCOUNTER — Encounter (HOSPITAL_BASED_OUTPATIENT_CLINIC_OR_DEPARTMENT_OTHER): Payer: Managed Care, Other (non HMO) | Admitting: Oncology

## 2013-06-20 VITALS — BP 119/63 | HR 84 | Temp 97.7°F | Resp 18 | Wt 127.6 lb

## 2013-06-20 DIAGNOSIS — C3430 Malignant neoplasm of lower lobe, unspecified bronchus or lung: Secondary | ICD-10-CM

## 2013-06-20 DIAGNOSIS — C781 Secondary malignant neoplasm of mediastinum: Secondary | ICD-10-CM

## 2013-06-20 DIAGNOSIS — C771 Secondary and unspecified malignant neoplasm of intrathoracic lymph nodes: Secondary | ICD-10-CM

## 2013-06-20 DIAGNOSIS — C3410 Malignant neoplasm of upper lobe, unspecified bronchus or lung: Secondary | ICD-10-CM

## 2013-06-20 DIAGNOSIS — J449 Chronic obstructive pulmonary disease, unspecified: Secondary | ICD-10-CM

## 2013-06-20 DIAGNOSIS — F17200 Nicotine dependence, unspecified, uncomplicated: Secondary | ICD-10-CM

## 2013-06-20 DIAGNOSIS — Z5111 Encounter for antineoplastic chemotherapy: Secondary | ICD-10-CM

## 2013-06-20 LAB — CBC WITH DIFFERENTIAL/PLATELET
BASOS PCT: 1 % (ref 0–1)
Basophils Absolute: 0 10*3/uL (ref 0.0–0.1)
EOS ABS: 0.1 10*3/uL (ref 0.0–0.7)
Eosinophils Relative: 2 % (ref 0–5)
HCT: 33.8 % — ABNORMAL LOW (ref 36.0–46.0)
Hemoglobin: 11.4 g/dL — ABNORMAL LOW (ref 12.0–15.0)
Lymphocytes Relative: 25 % (ref 12–46)
Lymphs Abs: 0.7 10*3/uL (ref 0.7–4.0)
MCH: 30.9 pg (ref 26.0–34.0)
MCHC: 33.7 g/dL (ref 30.0–36.0)
MCV: 91.6 fL (ref 78.0–100.0)
Monocytes Absolute: 0.5 10*3/uL (ref 0.1–1.0)
Monocytes Relative: 16 % — ABNORMAL HIGH (ref 3–12)
Neutro Abs: 1.7 10*3/uL (ref 1.7–7.7)
Neutrophils Relative %: 56 % (ref 43–77)
PLATELETS: 209 10*3/uL (ref 150–400)
RBC: 3.69 MIL/uL — ABNORMAL LOW (ref 3.87–5.11)
RDW: 15.6 % — AB (ref 11.5–15.5)
WBC: 3 10*3/uL — ABNORMAL LOW (ref 4.0–10.5)

## 2013-06-20 LAB — BASIC METABOLIC PANEL
BUN: 11 mg/dL (ref 6–23)
CALCIUM: 9.2 mg/dL (ref 8.4–10.5)
CO2: 26 mEq/L (ref 19–32)
Chloride: 102 mEq/L (ref 96–112)
Creatinine, Ser: 0.67 mg/dL (ref 0.50–1.10)
GFR calc Af Amer: >90 mL/min (ref >90)
Glucose, Bld: 96 mg/dL (ref 70–99)
Potassium: 3.9 mEq/L (ref 3.7–5.3)
SODIUM: 138 meq/L (ref 137–147)

## 2013-06-20 MED ORDER — SODIUM CHLORIDE 0.9 % IV SOLN: 16.0000 mg | Freq: Once | INTRAVENOUS | Status: DC

## 2013-06-20 MED ORDER — SODIUM CHLORIDE 0.9 % IV SOLN
Freq: Once | INTRAVENOUS | Status: AC
  Administered 2013-06-20: 12:00:00 via INTRAVENOUS

## 2013-06-20 MED ORDER — SODIUM CHLORIDE 0.9 % IJ SOLN
10.0000 mL | INTRAMUSCULAR | Status: DC | PRN
  Administered 2013-06-20: 10 mL

## 2013-06-20 MED ORDER — HEPARIN SOD (PORK) LOCK FLUSH 100 UNIT/ML IV SOLN
500.0000 [IU] | Freq: Once | INTRAVENOUS | Status: AC | PRN
  Administered 2013-06-20: 500 [IU]
  Filled 2013-06-20: qty 5

## 2013-06-20 MED ORDER — DEXAMETHASONE SODIUM PHOSPHATE 10 MG/ML IJ SOLN: 20.0000 mg | Freq: Once | INTRAMUSCULAR | Status: DC

## 2013-06-20 MED ORDER — SODIUM CHLORIDE 0.9 % IV SOLN
225.0000 mg | Freq: Once | INTRAVENOUS | Status: AC
  Administered 2013-06-20: 230 mg via INTRAVENOUS
  Filled 2013-06-20: qty 23

## 2013-06-20 MED ORDER — DIPHENHYDRAMINE HCL 50 MG/ML IJ SOLN
INTRAMUSCULAR | Status: AC
  Filled 2013-06-20: qty 1

## 2013-06-20 MED ORDER — FAMOTIDINE IN NACL 20-0.9 MG/50ML-% IV SOLN
20.0000 mg | Freq: Once | INTRAVENOUS | Status: AC
  Administered 2013-06-20: 20 mg via INTRAVENOUS

## 2013-06-20 MED ORDER — FAMOTIDINE IN NACL 20-0.9 MG/50ML-% IV SOLN
INTRAVENOUS | Status: AC
  Filled 2013-06-20: qty 50

## 2013-06-20 MED ORDER — ONDANSETRON HCL 40 MG/20ML IJ SOLN
Freq: Once | INTRAMUSCULAR | Status: AC
  Administered 2013-06-20: 16 mg via INTRAVENOUS
  Filled 2013-06-20: qty 8

## 2013-06-20 MED ORDER — DIPHENHYDRAMINE HCL 50 MG/ML IJ SOLN
50.0000 mg | Freq: Once | INTRAMUSCULAR | Status: AC
  Administered 2013-06-20: 50 mg via INTRAVENOUS

## 2013-06-20 MED ORDER — PACLITAXEL CHEMO INJECTION 300 MG/50ML
45.0000 mg/m2 | Freq: Once | INTRAVENOUS | Status: AC
  Administered 2013-06-20: 72 mg via INTRAVENOUS
  Filled 2013-06-20: qty 12

## 2013-06-20 NOTE — Patient Instructions (Signed)
[facility] Discharge Instructions  RECOMMENDATIONS MADE BY THE CONSULTANT AND ANY TEST RESULTS WILL BE SENT TO YOUR REFERRING PHYSICIAN.  MEDICATIONS PRESCRIBED:  None  INSTRUCTIONS GIVEN AND DISCUSSED: Use liquid pain medication for radiation-induced chest discomfort.    SPECIAL INSTRUCTIONS/FOLLOW-UP: Continue with chemotherapy and radiation as planned.  Return as scheduled in 2 weeks for follow-up. Call the clinic with any questions, issues, or concerns.   Thank you for choosing [facility] to provide your oncology and hematology care.  To afford each patient quality time with our providers, please arrive at least 15 minutes before your scheduled appointment time.  With your help, our goal is to use those 15 minutes to complete the necessary work-up to ensure our physicians have the information they need to help with your evaluation and healthcare recommendations.    Effective January 1st, 2014, we ask that you re-schedule your appointment with our physicians should you arrive 10 or more minutes late for your appointment.  We strive to give you quality time with our providers, and arriving late affects you and other patients whose appointments are after yours.    Again, thank you for choosing [facility].  Our hope is that these requests will decrease the amount of time that you wait before being seen by our physicians.       _____________________________________________________________  Should you have questions after your visit to [facility], please contact our office at [phone] between the hours of 8:30 a.m. and 5:00 p.m.  Voicemails left after 4:30 p.m. will not be returned until the following business day.  For prescription refill requests, have your pharmacy contact our office with your prescription refill request.

## 2013-06-25 ENCOUNTER — Other Ambulatory Visit (HOSPITAL_COMMUNITY): Payer: Self-pay | Admitting: *Deleted

## 2013-06-25 ENCOUNTER — Telehealth (HOSPITAL_COMMUNITY): Payer: Self-pay | Admitting: *Deleted

## 2013-06-25 DIAGNOSIS — C3410 Malignant neoplasm of upper lobe, unspecified bronchus or lung: Secondary | ICD-10-CM

## 2013-06-25 MED ORDER — AZITHROMYCIN 250 MG PO TABS: ORAL_TABLET | ORAL | Status: DC

## 2013-06-25 NOTE — Telephone Encounter (Signed)
Patient called and let me know that she has been having yellow and brownish colored sputum since Saturday. She had chills last pm. No fever that she is aware of. She hurts in her chest when she coughs. She has been dry heaving also. Patient instructed to take Z-pak as directed - called into [facility] per Kirby Crigler PA-C instructions. Patient instructed to take temperature and notify us if greater than 100.5. Patient instructed to take Reglan/metoclopramide four times a day (before breakfast, lunch, supper, & @ bedtime). And to also take Compazine/prochlorperazine the same way if needed. She verbalized understanding of all instructions.

## 2013-06-27 ENCOUNTER — Encounter (HOSPITAL_BASED_OUTPATIENT_CLINIC_OR_DEPARTMENT_OTHER): Payer: Managed Care, Other (non HMO)

## 2013-06-27 VITALS — BP 117/69 | HR 88 | Temp 97.8°F | Resp 18 | Wt 121.6 lb

## 2013-06-27 DIAGNOSIS — Z5111 Encounter for antineoplastic chemotherapy: Secondary | ICD-10-CM

## 2013-06-27 DIAGNOSIS — C3410 Malignant neoplasm of upper lobe, unspecified bronchus or lung: Secondary | ICD-10-CM

## 2013-06-27 DIAGNOSIS — C771 Secondary and unspecified malignant neoplasm of intrathoracic lymph nodes: Secondary | ICD-10-CM

## 2013-06-27 LAB — BASIC METABOLIC PANEL
BUN: 10 mg/dL (ref 6–23)
CALCIUM: 9.4 mg/dL (ref 8.4–10.5)
CO2: 26 meq/L (ref 19–32)
CREATININE: 0.71 mg/dL (ref 0.50–1.10)
Chloride: 100 mEq/L (ref 96–112)
GFR calc Af Amer: >90 mL/min (ref >90)
GFR calc non Af Amer: >90 mL/min (ref >90)
Glucose, Bld: 116 mg/dL — ABNORMAL HIGH (ref 70–99)
Potassium: 3.4 mEq/L — ABNORMAL LOW (ref 3.7–5.3)
Sodium: 137 mEq/L (ref 137–147)

## 2013-06-27 LAB — CBC WITH DIFFERENTIAL/PLATELET
Basophils Absolute: 0.1 10*3/uL (ref 0.0–0.1)
Basophils Relative: 2 % — ABNORMAL HIGH (ref 0–1)
Eosinophils Absolute: 0 10*3/uL (ref 0.0–0.7)
Eosinophils Relative: 1 % (ref 0–5)
HEMATOCRIT: 35.2 % — AB (ref 36.0–46.0)
HEMOGLOBIN: 11.5 g/dL — AB (ref 12.0–15.0)
LYMPHS PCT: 26 % (ref 12–46)
Lymphs Abs: 0.6 10*3/uL — ABNORMAL LOW (ref 0.7–4.0)
MCH: 30 pg (ref 26.0–34.0)
MCHC: 32.7 g/dL (ref 30.0–36.0)
MCV: 91.9 fL (ref 78.0–100.0)
MONO ABS: 0.3 10*3/uL (ref 0.1–1.0)
MONOS PCT: 15 % — AB (ref 3–12)
Neutro Abs: 1.2 10*3/uL — ABNORMAL LOW (ref 1.7–7.7)
Neutrophils Relative %: 56 % (ref 43–77)
Platelets: 148 10*3/uL — ABNORMAL LOW (ref 150–400)
RBC: 3.83 MIL/uL — ABNORMAL LOW (ref 3.87–5.11)
RDW: 15.7 % — AB (ref 11.5–15.5)
WBC: 2.1 10*3/uL — AB (ref 4.0–10.5)

## 2013-06-27 MED ORDER — FAMOTIDINE IN NACL 20-0.9 MG/50ML-% IV SOLN
20.0000 mg | Freq: Once | INTRAVENOUS | Status: AC
  Administered 2013-06-27: 20 mg via INTRAVENOUS
  Filled 2013-06-27: qty 50

## 2013-06-27 MED ORDER — DEXAMETHASONE SODIUM PHOSPHATE 10 MG/ML IJ SOLN: 20.0000 mg | Freq: Once | INTRAMUSCULAR | Status: DC

## 2013-06-27 MED ORDER — HEPARIN SOD (PORK) LOCK FLUSH 100 UNIT/ML IV SOLN
500.0000 [IU] | Freq: Once | INTRAVENOUS | Status: AC | PRN
  Administered 2013-06-27: 500 [IU]
  Filled 2013-06-27: qty 5

## 2013-06-27 MED ORDER — SODIUM CHLORIDE 0.9 % IV SOLN
Freq: Once | INTRAVENOUS | Status: AC
  Administered 2013-06-27: 12:00:00 via INTRAVENOUS

## 2013-06-27 MED ORDER — SODIUM CHLORIDE 0.9 % IV SOLN
225.0000 mg | Freq: Once | INTRAVENOUS | Status: AC
  Administered 2013-06-27: 230 mg via INTRAVENOUS
  Filled 2013-06-27: qty 23

## 2013-06-27 MED ORDER — SODIUM CHLORIDE 0.9 % IJ SOLN
10.0000 mL | INTRAMUSCULAR | Status: DC | PRN
  Administered 2013-06-27: 10 mL

## 2013-06-27 MED ORDER — DIPHENHYDRAMINE HCL 50 MG/ML IJ SOLN
50.0000 mg | Freq: Once | INTRAMUSCULAR | Status: AC
  Administered 2013-06-27: 50 mg via INTRAVENOUS
  Filled 2013-06-27: qty 1

## 2013-06-27 MED ORDER — SODIUM CHLORIDE 0.9 % IV SOLN: 16.0000 mg | Freq: Once | INTRAVENOUS | Status: DC

## 2013-06-27 MED ORDER — PACLITAXEL CHEMO INJECTION 300 MG/50ML
45.0000 mg/m2 | Freq: Once | INTRAVENOUS | Status: AC
  Administered 2013-06-27: 72 mg via INTRAVENOUS
  Filled 2013-06-27: qty 12

## 2013-06-27 MED ORDER — SODIUM CHLORIDE 0.9 % IV SOLN
Freq: Once | INTRAVENOUS | Status: AC
  Administered 2013-06-27: 16 mg via INTRAVENOUS
  Filled 2013-06-27: qty 8

## 2013-06-27 NOTE — Patient Instructions (Signed)
[facility] Discharge Instructions for Patients Receiving Chemotherapy  Today you received the following chemotherapy agents Cycle 5 Taxol and Carbo. You have one more Cycle of chemo and then to finish radiation treatments. Keep taking the nausea medicine and Reglan, finish the antibiotics you started this week. Keep all appointments as scheduled. Report any issues/concerns as they arise. If you develop nausea and vomiting that is not controlled by your nausea medication, call the clinic. If it is after clinic hours your family physician or the after hours number for the clinic or go to the Emergency Department.   BELOW ARE SYMPTOMS THAT SHOULD BE REPORTED IMMEDIATELY:  *FEVER GREATER THAN 101.0 F  *CHILLS WITH OR WITHOUT FEVER  NAUSEA AND VOMITING THAT IS NOT CONTROLLED WITH YOUR NAUSEA MEDICATION  *UNUSUAL SHORTNESS OF BREATH  *UNUSUAL BRUISING OR BLEEDING  TENDERNESS IN MOUTH AND THROAT WITH OR WITHOUT PRESENCE OF ULCERS  *URINARY PROBLEMS  *BOWEL PROBLEMS  UNUSUAL RASH Items with * indicate a potential emergency and should be followed up as soon as possible.  One of the nurses will contact you 24 hours after your treatment. Please let the nurse know about any problems that you may have experienced. Feel free to call the clinic you have any questions or concerns. The clinic phone number is [phone].   I have been informed and understand all the instructions given to me. I know to contact the clinic, my physician, or go to the Emergency Department if any problems should occur. I do not have any questions at this time, but understand that I may call the clinic during office hours or the Patient Navigator at [phone] should I have any questions or need assistance in obtaining follow up care.    __________________________________________  _____________  __________ Signature of Patient or Authorized Representative            Date                    Time    __________________________________________ Nurse's Signature

## 2013-06-28 ENCOUNTER — Other Ambulatory Visit (HOSPITAL_COMMUNITY): Payer: Self-pay | Admitting: Hematology and Oncology

## 2013-06-29 ENCOUNTER — Encounter (HOSPITAL_COMMUNITY): Payer: Self-pay | Admitting: *Deleted

## 2013-07-02 ENCOUNTER — Other Ambulatory Visit (HOSPITAL_COMMUNITY): Payer: Self-pay | Admitting: *Deleted

## 2013-07-02 DIAGNOSIS — C3410 Malignant neoplasm of upper lobe, unspecified bronchus or lung: Secondary | ICD-10-CM

## 2013-07-02 MED ORDER — ONDANSETRON HCL 8 MG PO TABS: 8.0000 mg | ORAL_TABLET | Freq: Three times a day (TID) | ORAL | Status: DC | PRN

## 2013-07-02 NOTE — Addendum Note (Signed)
Addended by: Gerhard Perches on: 07/02/2013 09:29 AM   Modules accepted: Orders

## 2013-07-02 NOTE — Progress Notes (Signed)
Zofran escribed to [employer] per [facility].

## 2013-07-04 ENCOUNTER — Encounter (HOSPITAL_COMMUNITY): Payer: Self-pay

## 2013-07-04 ENCOUNTER — Encounter (HOSPITAL_COMMUNITY): Payer: Managed Care, Other (non HMO) | Attending: Hematology and Oncology

## 2013-07-04 ENCOUNTER — Encounter (HOSPITAL_BASED_OUTPATIENT_CLINIC_OR_DEPARTMENT_OTHER): Payer: Managed Care, Other (non HMO)

## 2013-07-04 VITALS — BP 109/72 | HR 108 | Temp 98.5°F | Resp 18 | Wt 119.0 lb

## 2013-07-04 DIAGNOSIS — C771 Secondary and unspecified malignant neoplasm of intrathoracic lymph nodes: Secondary | ICD-10-CM

## 2013-07-04 DIAGNOSIS — C3410 Malignant neoplasm of upper lobe, unspecified bronchus or lung: Secondary | ICD-10-CM

## 2013-07-04 DIAGNOSIS — Z5111 Encounter for antineoplastic chemotherapy: Secondary | ICD-10-CM

## 2013-07-04 DIAGNOSIS — D702 Other drug-induced agranulocytosis: Secondary | ICD-10-CM

## 2013-07-04 DIAGNOSIS — R52 Pain, unspecified: Secondary | ICD-10-CM | POA: Insufficient documentation

## 2013-07-04 DIAGNOSIS — R110 Nausea: Secondary | ICD-10-CM | POA: Insufficient documentation

## 2013-07-04 DIAGNOSIS — R638 Other symptoms and signs concerning food and fluid intake: Secondary | ICD-10-CM | POA: Insufficient documentation

## 2013-07-04 DIAGNOSIS — D630 Anemia in neoplastic disease: Secondary | ICD-10-CM

## 2013-07-04 DIAGNOSIS — E876 Hypokalemia: Secondary | ICD-10-CM

## 2013-07-04 DIAGNOSIS — F17200 Nicotine dependence, unspecified, uncomplicated: Secondary | ICD-10-CM

## 2013-07-04 DIAGNOSIS — K1231 Oral mucositis (ulcerative) due to antineoplastic therapy: Secondary | ICD-10-CM

## 2013-07-04 LAB — CBC WITH DIFFERENTIAL/PLATELET
Basophils Absolute: 0 10*3/uL (ref 0.0–0.1)
Basophils Relative: 1 % (ref 0–1)
EOS ABS: 0 10*3/uL (ref 0.0–0.7)
Eosinophils Relative: 2 % (ref 0–5)
HCT: 32.5 % — ABNORMAL LOW (ref 36.0–46.0)
HEMOGLOBIN: 10.7 g/dL — AB (ref 12.0–15.0)
Lymphocytes Relative: 30 % (ref 12–46)
Lymphs Abs: 0.4 10*3/uL — ABNORMAL LOW (ref 0.7–4.0)
MCH: 30.7 pg (ref 26.0–34.0)
MCHC: 32.9 g/dL (ref 30.0–36.0)
MCV: 93.1 fL (ref 78.0–100.0)
MONOS PCT: 12 % (ref 3–12)
Monocytes Absolute: 0.2 10*3/uL (ref 0.1–1.0)
NEUTROS ABS: 0.7 10*3/uL — AB (ref 1.7–7.7)
NEUTROS PCT: 56 % (ref 43–77)
Platelets: 169 10*3/uL (ref 150–400)
RBC: 3.49 MIL/uL — AB (ref 3.87–5.11)
RDW: 16 % — ABNORMAL HIGH (ref 11.5–15.5)
WBC: 1.3 10*3/uL — AB (ref 4.0–10.5)

## 2013-07-04 LAB — BASIC METABOLIC PANEL
BUN: 13 mg/dL (ref 6–23)
CALCIUM: 9.1 mg/dL (ref 8.4–10.5)
CO2: 29 meq/L (ref 19–32)
CREATININE: 0.89 mg/dL (ref 0.50–1.10)
Chloride: 97 mEq/L (ref 96–112)
GFR calc Af Amer: 86 mL/min — ABNORMAL LOW (ref >90)
GFR, EST NON AFRICAN AMERICAN: 74 mL/min — AB (ref >90)
Glucose, Bld: 124 mg/dL — ABNORMAL HIGH (ref 70–99)
Potassium: 3 mEq/L — ABNORMAL LOW (ref 3.7–5.3)
SODIUM: 137 meq/L (ref 137–147)

## 2013-07-04 MED ORDER — DIPHENHYDRAMINE HCL 50 MG/ML IJ SOLN
50.0000 mg | Freq: Once | INTRAMUSCULAR | Status: AC
  Administered 2013-07-04: 50 mg via INTRAVENOUS
  Filled 2013-07-04: qty 1

## 2013-07-04 MED ORDER — SODIUM CHLORIDE 0.9 % IV SOLN: 16.0000 mg | Freq: Once | INTRAVENOUS | Status: DC

## 2013-07-04 MED ORDER — FAMOTIDINE IN NACL 20-0.9 MG/50ML-% IV SOLN
INTRAVENOUS | Status: AC
  Filled 2013-07-04: qty 50

## 2013-07-04 MED ORDER — DEXAMETHASONE SODIUM PHOSPHATE 10 MG/ML IJ SOLN: 20.0000 mg | Freq: Once | INTRAMUSCULAR | Status: DC

## 2013-07-04 MED ORDER — SODIUM CHLORIDE 0.9 % IV SOLN
Freq: Once | INTRAVENOUS | Status: AC
  Administered 2013-07-04: 13:00:00 via INTRAVENOUS

## 2013-07-04 MED ORDER — POTASSIUM CHLORIDE CRYS ER 20 MEQ PO TBCR: 20.0000 meq | EXTENDED_RELEASE_TABLET | Freq: Two times a day (BID) | ORAL | Status: DC

## 2013-07-04 MED ORDER — DEXTROSE 5 % IV SOLN
45.0000 mg/m2 | Freq: Once | INTRAVENOUS | Status: AC
  Administered 2013-07-04: 72 mg via INTRAVENOUS
  Filled 2013-07-04: qty 12

## 2013-07-04 MED ORDER — CARBOPLATIN CHEMO INJECTION 450 MG/45ML: 225.0000 mg | Freq: Once | INTRAVENOUS | Status: DC

## 2013-07-04 MED ORDER — SODIUM CHLORIDE 0.9 % IV SOLN
190.0000 mg | Freq: Once | INTRAVENOUS | Status: AC
  Administered 2013-07-04: 190 mg via INTRAVENOUS
  Filled 2013-07-04: qty 19

## 2013-07-04 MED ORDER — FAMOTIDINE IN NACL 20-0.9 MG/50ML-% IV SOLN
20.0000 mg | Freq: Once | INTRAVENOUS | Status: AC
  Administered 2013-07-04: 20 mg via INTRAVENOUS

## 2013-07-04 MED ORDER — SODIUM CHLORIDE 0.9 % IV SOLN
Freq: Once | INTRAVENOUS | Status: AC
  Administered 2013-07-04: 16 mg via INTRAVENOUS
  Filled 2013-07-04: qty 8

## 2013-07-04 MED ORDER — SODIUM CHLORIDE 0.9 % IJ SOLN
10.0000 mL | INTRAMUSCULAR | Status: AC | PRN
  Administered 2013-07-04: 10 mL

## 2013-07-04 MED ORDER — HEPARIN SOD (PORK) LOCK FLUSH 100 UNIT/ML IV SOLN
500.0000 [IU] | Freq: Once | INTRAVENOUS | Status: AC | PRN
Start: 2013-07-04 — End: 2013-07-04
  Administered 2013-07-04: 500 [IU]
  Filled 2013-07-04: qty 5

## 2013-07-04 NOTE — Patient Instructions (Signed)
[facility] Discharge Instructions  RECOMMENDATIONS MADE BY THE CONSULTANT AND ANY TEST RESULTS WILL BE SENT TO YOUR REFERRING PHYSICIAN.  EXAM FINDINGS BY THE PHYSICIAN TODAY AND SIGNS OR SYMPTOMS TO REPORT TO CLINIC OR PRIMARY PHYSICIAN: Exam and findings as discussed by Dr. Barnet Glasgow.  Report fevers, chills, uncontrolled nausea, vomiting or pain, increased weight loss or other problems  MEDICATIONS PRESCRIBED:  Potassium - take as directed  INSTRUCTIONS/FOLLOW-UP: Follow-up in 4 weeks with labs and office visit.  Thank you for choosing [facility] to provide your oncology and hematology care.  To afford each patient quality time with our providers, please arrive at least 15 minutes before your scheduled appointment time.  With your help, our goal is to use those 15 minutes to complete the necessary work-up to ensure our physicians have the information they need to help with your evaluation and healthcare recommendations.    Effective January 1st, 2014, we ask that you re-schedule your appointment with our physicians should you arrive 10 or more minutes late for your appointment.  We strive to give you quality time with our providers, and arriving late affects you and other patients whose appointments are after yours.    Again, thank you for choosing [facility].  Our hope is that these requests will decrease the amount of time that you wait before being seen by our physicians.       _____________________________________________________________  Should you have questions after your visit to [facility], please contact our office at [phone] between the hours of 8:30 a.m. and 5:00 p.m.  Voicemails left after 4:30 p.m. will not be returned until the following business day.  For prescription refill requests, have your pharmacy contact our office with your prescription refill request.

## 2013-07-04 NOTE — Progress Notes (Signed)
[facility]  OFFICE PROGRESS NOTE  Glo Herring., MD [address] [place] [address]  DIAGNOSIS: No diagnosis found.  Chief Complaint  Patient presents with  . Stage III squamous cell lung cancer    Bimodality therapy with carboplatin/Taxol/RT    CURRENT THERAPY: Weekly carboplatin/Taxol plus daily radiotherapy for cycle #6 today  INTERVAL HISTORY: Heather Coffey 53 y.o. 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  She does have difficulty with swallowing but is using Dukes mouthwash as well as analgesics. She had occasional nausea which is better controlled with Zofran daily. She denies a diarrhea, constipation, urinary hesitancy, hemoptysis, but with chronic cough but no expectoration. She denies he chest pain, PND, orthopnea, palpitations, or peripheral paresthesias. Daily headaches are relieved by Tylenol.  MEDICAL HISTORY: Past Medical History  Diagnosis Date  . Arthritis   . Anxiety   . Hyperlipidemia   . Tobacco abuse   . Shortness of breath     Hx: of breath with exertion  . Depression   . GERD (gastroesophageal reflux disease)   . H/O hiatal hernia   . 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   Lung cancer, lingula  04/29/2013  Initial Diagnosis  Squamous cell carcinoma  05/30/2013 -  Chemotherapy  Carboplatin/Paclitaxel weekly in combination with radiation therapy  Radiation Therapy   ALLERGIES:  is allergic to augmentin; codeine; and tetracyclines & related.  MEDICATIONS: has a current medication list which includes the following prescription(s):  albuterol, alprazolam, aspirin ec, cyclobenzaprine, first-dukes mouthwash, esomeprazole, fluoxetine, fluticasone, fluticasone-salmeterol, guaifenesin, hydrocodone-acetaminophen, lidocaine-prilocaine, loratadine-pseudoephedrine, metoclopramide, montelukast, multivitamin, nystatin, ondansetron, pravastatin, sucralfate, tramadol, triamcinolone cream, azithromycin, and potassium chloride sa, and the following Facility-Administered Medications: CARBOplatin (PARAPLATIN) 190 mg in sodium chloride 0.9 % 100 mL chemo infusion, heparin lock flush, ondansetron (ZOFRAN) 16 mg, dexamethasone (DECADRON) 20 mg in sodium chloride 0.9 % 50 mL IVPB, PACLitaxel (TAXOL) 72 mg in dextrose 5 % 250 mL chemo infusion (</= 62m/m2), and sodium chloride.  SURGICAL HISTORY:  Past Surgical History  Procedure Laterality Date  . Tubal ligation    . Breast lumpectomy      Hx: of  . Video bronchoscopy with endobronchial ultrasound N/A 05/03/2013    Procedure: VIDEO BRONCHOSCOPY WITH ENDOBRONCHIAL ULTRASOUND;  Surgeon: SMelrose Nakayama MD;  Location: [facility]  Service: Thoracic;  Laterality: N/A;  . Abdominal hysterectomy      still has ovaries per pt  . Nasal septum surgery    . Portacath placement Left 05/21/2013    Procedure: INSERTION PORT-A-CATH;  Surgeon: SMelrose Nakayama MD;  [facility];  Service: Thoracic;  Laterality: Left;    FAMILY HISTORY: family history includes COPD in her brother, father, mother, and sister; Cancer in her sister; Diabetes in her sister; Hypercholesterolemia in her mother.  SOCIAL HISTORY:  reports that she has been smoking Cigarettes.  She has a 17.5 pack-year smoking history. She has never used smokeless tobacco. She reports that she does not drink alcohol or use illicit drugs.  REVIEW OF SYSTEMS:  Other than that discussed above is noncontributory.  PHYSICAL EXAMINATION: ECOG PERFORMANCE STATUS: 1 - Symptomatic but  completely ambulatory  There were no vitals taken for this  visit.  GENERAL:alert, no distress and comfortable. Mild alopecia. SKIN: skin color, texture, turgor are normal, no rashes or significant lesions EYES: PERLA; Conjunctiva are pink and non-injected, sclera clear SINUSES: No redness or tenderness over maxillary or ethmoid sinuses OROPHARYNX:no exudate, no erythema on lips, buccal mucosa, or tongue. NECK: supple, thyroid normal size, non-tender, without nodularity. No masses CHEST: Increased AP diameter were no breast masses. LYMPH:  no palpable lymphadenopathy in the cervical, axillary or inguinal LUNGS: clear to auscultation and percussion with normal breathing effort HEART: regular rate & rhythm and no murmurs. ABDOMEN:abdomen soft, non-tender and normal bowel sounds MUSCULOSKELETAL:no cyanosis of digits and no clubbing. Range of motion normal.  NEURO: alert & oriented x 3 with fluent speech, no focal motor/sensory deficits   LABORATORY DATA: Infusion on 07/04/2013  Component Date Value Ref Range Status  . WBC 07/04/2013 1.3* 4.0 - 10.5 K/uL Final   Comment: RESULT REPEATED AND VERIFIED                          WHITE COUNT CONFIRMED ON SMEAR                          CRITICAL RESULT CALLED TO, READ BACK BY AND VERIFIED WITH:                          JENNIFER DOYLE RN ON [id number] AT 1230 BY RESSEGGER R  . RBC 07/04/2013 3.49* 3.87 - 5.11 MIL/uL Final  . Hemoglobin 07/04/2013 10.7* 12.0 - 15.0 g/dL Final  . HCT 07/04/2013 32.5* 36.0 - 46.0 % Final  . MCV 07/04/2013 93.1  78.0 - 100.0 fL Final  . MCH 07/04/2013 30.7  26.0 - 34.0 pg Final  . MCHC 07/04/2013 32.9  30.0 - 36.0 g/dL Final  . RDW 07/04/2013 16.0* 11.5 - 15.5 % Final  . Platelets 07/04/2013 169  150 - 400 K/uL Final  . Neutrophils Relative % 07/04/2013 56  43 - 77 % Final  . Neutro Abs 07/04/2013 0.7* 1.7 - 7.7 K/uL Final  . Lymphocytes Relative 07/04/2013 30  12 - 46 % Final  . Lymphs Abs 07/04/2013 0.4* 0.7 - 4.0 K/uL Final  . Monocytes Relative 07/04/2013 12  3 - 12 %  Final  . Monocytes Absolute 07/04/2013 0.2  0.1 - 1.0 K/uL Final  . Eosinophils Relative 07/04/2013 2  0 - 5 % Final  . Eosinophils Absolute 07/04/2013 0.0  0.0 - 0.7 K/uL Final  . Basophils Relative 07/04/2013 1  0 - 1 % Final  . Basophils Absolute 07/04/2013 0.0  0.0 - 0.1 K/uL Final  . Sodium 07/04/2013 137  137 - 147 mEq/L Final  . Potassium 07/04/2013 3.0* 3.7 - 5.3 mEq/L Final  . Chloride 07/04/2013 97  96 - 112 mEq/L Final  . CO2 07/04/2013 29  19 - 32 mEq/L Final  . Glucose, Bld 07/04/2013 124* 70 - 99 mg/dL Final  . BUN 07/04/2013 13  6 - 23 mg/dL Final  . Creatinine, Ser 07/04/2013 0.89  0.50 - 1.10 mg/dL Final  . Calcium 07/04/2013 9.1  8.4 - 10.5 mg/dL Final  . GFR calc non Af Amer 07/04/2013 74* >90 mL/min Final  . GFR calc Af Amer 07/04/2013 86* >90 mL/min Final   Comment: (NOTE)  The eGFR has been calculated using the CKD EPI equation.                          This calculation has not been validated in all clinical situations.                          eGFR's persistently <90 mL/min signify possible Chronic Kidney                          Disease.  Infusion on 06/27/2013  Component Date Value Ref Range Status  . WBC 06/27/2013 2.1* 4.0 - 10.5 K/uL Final  . RBC 06/27/2013 3.83* 3.87 - 5.11 MIL/uL Final  . Hemoglobin 06/27/2013 11.5* 12.0 - 15.0 g/dL Final  . HCT 06/27/2013 35.2* 36.0 - 46.0 % Final  . MCV 06/27/2013 91.9  78.0 - 100.0 fL Final  . MCH 06/27/2013 30.0  26.0 - 34.0 pg Final  . MCHC 06/27/2013 32.7  30.0 - 36.0 g/dL Final  . RDW 06/27/2013 15.7* 11.5 - 15.5 % Final  . Platelets 06/27/2013 148* 150 - 400 K/uL Final  . Neutrophils Relative % 06/27/2013 56  43 - 77 % Final  . Neutro Abs 06/27/2013 1.2* 1.7 - 7.7 K/uL Final  . Lymphocytes Relative 06/27/2013 26  12 - 46 % Final  . Lymphs Abs 06/27/2013 0.6* 0.7 - 4.0 K/uL Final  . Monocytes Relative 06/27/2013 15* 3 - 12 % Final  . Monocytes Absolute 06/27/2013 0.3  0.1 - 1.0 K/uL  Final  . Eosinophils Relative 06/27/2013 1  0 - 5 % Final  . Eosinophils Absolute 06/27/2013 0.0  0.0 - 0.7 K/uL Final  . Basophils Relative 06/27/2013 2* 0 - 1 % Final  . Basophils Absolute 06/27/2013 0.1  0.0 - 0.1 K/uL Final  . Sodium 06/27/2013 137  137 - 147 mEq/L Final  . Potassium 06/27/2013 3.4* 3.7 - 5.3 mEq/L Final  . Chloride 06/27/2013 100  96 - 112 mEq/L Final  . CO2 06/27/2013 26  19 - 32 mEq/L Final  . Glucose, Bld 06/27/2013 116* 70 - 99 mg/dL Final  . BUN 06/27/2013 10  6 - 23 mg/dL Final  . Creatinine, Ser 06/27/2013 0.71  0.50 - 1.10 mg/dL Final  . Calcium 06/27/2013 9.4  8.4 - 10.5 mg/dL Final  . GFR calc non Af Amer 06/27/2013 >90  >90 mL/min Final  . GFR calc Af Amer 06/27/2013 >90  >90 mL/min Final   Comment: (NOTE)                          The eGFR has been calculated using the CKD EPI equation.                          This calculation has not been validated in all clinical situations.                          eGFR's persistently <90 mL/min signify possible Chronic Kidney                          Disease.  Infusion on 06/20/2013  Component Date Value Ref Range Status  . WBC 06/20/2013 3.0* 4.0 - 10.5 K/uL Final  . RBC 06/20/2013 3.69* 3.87 -  5.11 MIL/uL Final  . Hemoglobin 06/20/2013 11.4* 12.0 - 15.0 g/dL Final  . HCT 06/20/2013 33.8* 36.0 - 46.0 % Final  . MCV 06/20/2013 91.6  78.0 - 100.0 fL Final  . MCH 06/20/2013 30.9  26.0 - 34.0 pg Final  . MCHC 06/20/2013 33.7  30.0 - 36.0 g/dL Final  . RDW 06/20/2013 15.6* 11.5 - 15.5 % Final  . Platelets 06/20/2013 209  150 - 400 K/uL Final  . Neutrophils Relative % 06/20/2013 56  43 - 77 % Final  . Neutro Abs 06/20/2013 1.7  1.7 - 7.7 K/uL Final  . Lymphocytes Relative 06/20/2013 25  12 - 46 % Final  . Lymphs Abs 06/20/2013 0.7  0.7 - 4.0 K/uL Final  . Monocytes Relative 06/20/2013 16* 3 - 12 % Final  . Monocytes Absolute 06/20/2013 0.5  0.1 - 1.0 K/uL Final  . Eosinophils Relative 06/20/2013 2  0 - 5 % Final    . Eosinophils Absolute 06/20/2013 0.1  0.0 - 0.7 K/uL Final  . Basophils Relative 06/20/2013 1  0 - 1 % Final  . Basophils Absolute 06/20/2013 0.0  0.0 - 0.1 K/uL Final  . Sodium 06/20/2013 138  137 - 147 mEq/L Final  . Potassium 06/20/2013 3.9  3.7 - 5.3 mEq/L Final  . Chloride 06/20/2013 102  96 - 112 mEq/L Final  . CO2 06/20/2013 26  19 - 32 mEq/L Final  . Glucose, Bld 06/20/2013 96  70 - 99 mg/dL Final  . BUN 06/20/2013 11  6 - 23 mg/dL Final  . Creatinine, Ser 06/20/2013 0.67  0.50 - 1.10 mg/dL Final  . Calcium 06/20/2013 9.2  8.4 - 10.5 mg/dL Final  . GFR calc non Af Amer 06/20/2013 >90  >90 mL/min Final  . GFR calc Af Amer 06/20/2013 >90  >90 mL/min Final   Comment: (NOTE)                          The eGFR has been calculated using the CKD EPI equation.                          This calculation has not been validated in all clinical situations.                          eGFR's persistently <90 mL/min signify possible Chronic Kidney                          Disease.  Infusion on 06/13/2013  Component Date Value Ref Range Status  . WBC 06/13/2013 3.5* 4.0 - 10.5 K/uL Final  . RBC 06/13/2013 4.11  3.87 - 5.11 MIL/uL Final  . Hemoglobin 06/13/2013 12.4  12.0 - 15.0 g/dL Final  . HCT 06/13/2013 37.0  36.0 - 46.0 % Final  . MCV 06/13/2013 90.0  78.0 - 100.0 fL Final  . MCH 06/13/2013 30.2  26.0 - 34.0 pg Final  . MCHC 06/13/2013 33.5  30.0 - 36.0 g/dL Final  . RDW 06/13/2013 15.3  11.5 - 15.5 % Final  . Platelets 06/13/2013 290  150 - 400 K/uL Final  . Neutrophils Relative % 06/13/2013 63  43 - 77 % Final  . Neutro Abs 06/13/2013 2.2  1.7 - 7.7 K/uL Final  . Lymphocytes Relative 06/13/2013 18  12 - 46 % Final  . Lymphs Abs 06/13/2013 0.6* 0.7 -  4.0 K/uL Final  . Monocytes Relative 06/13/2013 15* 3 - 12 % Final  . Monocytes Absolute 06/13/2013 0.5  0.1 - 1.0 K/uL Final  . Eosinophils Relative 06/13/2013 3  0 - 5 % Final  . Eosinophils Absolute 06/13/2013 0.1  0.0 - 0.7 K/uL Final   . Basophils Relative 06/13/2013 1  0 - 1 % Final  . Basophils Absolute 06/13/2013 0.0  0.0 - 0.1 K/uL Final  . Sodium 06/13/2013 139  137 - 147 mEq/L Final  . Potassium 06/13/2013 3.8  3.7 - 5.3 mEq/L Final  . Chloride 06/13/2013 103  96 - 112 mEq/L Final  . CO2 06/13/2013 26  19 - 32 mEq/L Final  . Glucose, Bld 06/13/2013 87  70 - 99 mg/dL Final  . BUN 06/13/2013 13  6 - 23 mg/dL Final  . Creatinine, Ser 06/13/2013 0.61  0.50 - 1.10 mg/dL Final  . Calcium 06/13/2013 9.4  8.4 - 10.5 mg/dL Final  . GFR calc non Af Amer 06/13/2013 >90  >90 mL/min Final  . GFR calc Af Amer 06/13/2013 >90  >90 mL/min Final   Comment: (NOTE)                          The eGFR has been calculated using the CKD EPI equation.                          This calculation has not been validated in all clinical situations.                          eGFR's persistently <90 mL/min signify possible Chronic Kidney                          Disease.  Infusion on 06/06/2013  Component Date Value Ref Range Status  . WBC 06/06/2013 5.5  4.0 - 10.5 K/uL Final  . RBC 06/06/2013 4.13  3.87 - 5.11 MIL/uL Final  . Hemoglobin 06/06/2013 12.2  12.0 - 15.0 g/dL Final  . HCT 06/06/2013 37.3  36.0 - 46.0 % Final  . MCV 06/06/2013 90.3  78.0 - 100.0 fL Final  . MCH 06/06/2013 29.5  26.0 - 34.0 pg Final  . MCHC 06/06/2013 32.7  30.0 - 36.0 g/dL Final  . RDW 06/06/2013 15.5  11.5 - 15.5 % Final  . Platelets 06/06/2013 354  150 - 400 K/uL Final  . Neutrophils Relative % 06/06/2013 69  43 - 77 % Final  . Neutro Abs 06/06/2013 3.8  1.7 - 7.7 K/uL Final  . Lymphocytes Relative 06/06/2013 19  12 - 46 % Final  . Lymphs Abs 06/06/2013 1.1  0.7 - 4.0 K/uL Final  . Monocytes Relative 06/06/2013 10  3 - 12 % Final  . Monocytes Absolute 06/06/2013 0.5  0.1 - 1.0 K/uL Final  . Eosinophils Relative 06/06/2013 2  0 - 5 % Final  . Eosinophils Absolute 06/06/2013 0.1  0.0 - 0.7 K/uL Final  . Basophils Relative 06/06/2013 1  0 - 1 % Final  .  Basophils Absolute 06/06/2013 0.0  0.0 - 0.1 K/uL Final  . Sodium 06/06/2013 139  137 - 147 mEq/L Final  . Potassium 06/06/2013 4.4  3.7 - 5.3 mEq/L Final  . Chloride 06/06/2013 104  96 - 112 mEq/L Final  . CO2 06/06/2013 25  19 - 32 mEq/L Final  .  Glucose, Bld 06/06/2013 99  70 - 99 mg/dL Final  . BUN 06/06/2013 19  6 - 23 mg/dL Final  . Creatinine, Ser 06/06/2013 0.72  0.50 - 1.10 mg/dL Final  . Calcium 06/06/2013 9.2  8.4 - 10.5 mg/dL Final  . GFR calc non Af Amer 06/06/2013 >90  >90 mL/min Final  . GFR calc Af Amer 06/06/2013 >90  >90 mL/min Final   Comment: (NOTE)                          The eGFR has been calculated using the CKD EPI equation.                          This calculation has not been validated in all clinical situations.                          eGFR's persistently <90 mL/min signify possible Chronic Kidney                          Disease.    PATHOLOGY: No new pathology. Inadequate tissue was provided to [facility] so molecular analysis could not be done.  Urinalysis No results found for this basename: colorurine,  appearanceur,  labspec,  phurine,  glucoseu,  hgbur,  bilirubinur,  ketonesur,  proteinur,  urobilinogen,  nitrite,  leukocytesur    RADIOGRAPHIC STUDIES: No results found.  ASSESSMENT:  1. Stage IIIB squamous cell carcinoma of the lung with a previous history of lung abscess. On concomitant chemoradiation with carboplatin/paclitaxel weekly.  2. Chronic obstructive pulmonary disease, unfortunately still smoking.  3. History of right lung abscess, status post medical treatment only occurring about 3 or 4 years ago requiring hospitalization and intravenous antibiotics with no antecedent history of aspiration or loss of consciousness.  4. Gastroesophageal reflux disease, controlled  5. Radiation-induced chest discomfort, on liquid pain medication . 6. Anemia and neutropenia secondary to combined modality therapy. 7. Hypokalemia.    PLAN:  #1.  Sixth and final treatment with carboplatin Taxol today with radiation to continue for 8 more treatments. #2. K-Dur 20 one tablet twice a day to correct hypokalemia. #3. Continued [facility] mouthwash all analgesics and Zofran daily. #4. Followup in 4 weeks with CBC, chem profile, and LDH. At that time CT scans will be ordered and decision made about further therapy. The patient and her son who accompanied her today are in agreement with this strategy.    All questions were answered. The patient knows to call the clinic with any problems, questions or concerns. We can certainly see the patient much sooner if necessary.   I spent 25 minutes counseling the patient face to face. The total time spent in the appointment was 30 minutes.    Doroteo Bradford, MD 07/04/2013 1:26 PM

## 2013-07-04 NOTE — Progress Notes (Unsigned)
CRITICAL VALUE ALERT  Critical value received:  WBC 1.3  Date of notification:  07/04/2012  Time of notification:  1235  Critical value read back:yes  Nurse who received alert:  Shellia Carwin RN  MD notified (1st page): T. Sheldon Silvan  Time of first page:  1236  MD notified (2nd page):  Time of second page:  Responding MD:  ***  Time MD responded:  ***

## 2013-07-11 ENCOUNTER — Telehealth (HOSPITAL_COMMUNITY): Payer: Self-pay

## 2013-07-11 ENCOUNTER — Other Ambulatory Visit (HOSPITAL_COMMUNITY): Payer: Self-pay | Admitting: Oncology

## 2013-07-11 DIAGNOSIS — C3410 Malignant neoplasm of upper lobe, unspecified bronchus or lung: Secondary | ICD-10-CM

## 2013-07-11 MED ORDER — FIRST-DUKES MOUTHWASH MT SUSP: 5.0000 mL | Freq: Four times a day (QID) | OROMUCOSAL | Status: DC

## 2013-07-13 ENCOUNTER — Encounter (HOSPITAL_COMMUNITY): Payer: Self-pay | Admitting: *Deleted

## 2013-07-13 ENCOUNTER — Other Ambulatory Visit (HOSPITAL_COMMUNITY): Payer: Self-pay | Admitting: Hematology and Oncology

## 2013-07-13 MED ORDER — HYDROCODONE-ACETAMINOPHEN 7.5-325 MG/15ML PO SOLN: ORAL | Status: DC

## 2013-07-17 ENCOUNTER — Other Ambulatory Visit (HOSPITAL_COMMUNITY): Payer: Self-pay | Admitting: Oncology

## 2013-07-17 DIAGNOSIS — C3410 Malignant neoplasm of upper lobe, unspecified bronchus or lung: Secondary | ICD-10-CM

## 2013-07-17 DIAGNOSIS — C771 Secondary and unspecified malignant neoplasm of intrathoracic lymph nodes: Secondary | ICD-10-CM

## 2013-07-17 DIAGNOSIS — R638 Other symptoms and signs concerning food and fluid intake: Secondary | ICD-10-CM

## 2013-07-18 ENCOUNTER — Encounter (HOSPITAL_BASED_OUTPATIENT_CLINIC_OR_DEPARTMENT_OTHER): Payer: Managed Care, Other (non HMO)

## 2013-07-18 VITALS — BP 112/76 | HR 95 | Temp 97.6°F | Resp 18 | Wt 112.0 lb

## 2013-07-18 DIAGNOSIS — R52 Pain, unspecified: Secondary | ICD-10-CM

## 2013-07-18 DIAGNOSIS — R110 Nausea: Secondary | ICD-10-CM

## 2013-07-18 DIAGNOSIS — E876 Hypokalemia: Secondary | ICD-10-CM

## 2013-07-18 DIAGNOSIS — C771 Secondary and unspecified malignant neoplasm of intrathoracic lymph nodes: Secondary | ICD-10-CM

## 2013-07-18 DIAGNOSIS — R638 Other symptoms and signs concerning food and fluid intake: Secondary | ICD-10-CM

## 2013-07-18 DIAGNOSIS — C3410 Malignant neoplasm of upper lobe, unspecified bronchus or lung: Secondary | ICD-10-CM

## 2013-07-18 LAB — CBC WITH DIFFERENTIAL/PLATELET
BASOS ABS: 0 10*3/uL (ref 0.0–0.1)
BASOS PCT: 0 % (ref 0–1)
EOS ABS: 0 10*3/uL (ref 0.0–0.7)
Eosinophils Relative: 0 % (ref 0–5)
HCT: 32.4 % — ABNORMAL LOW (ref 36.0–46.0)
HEMOGLOBIN: 10.7 g/dL — AB (ref 12.0–15.0)
Lymphocytes Relative: 7 % — ABNORMAL LOW (ref 12–46)
Lymphs Abs: 0.8 10*3/uL (ref 0.7–4.0)
MCH: 31.5 pg (ref 26.0–34.0)
MCHC: 33 g/dL (ref 30.0–36.0)
MCV: 95.3 fL (ref 78.0–100.0)
MONO ABS: 1.9 10*3/uL — AB (ref 0.1–1.0)
MONOS PCT: 16 % — AB (ref 3–12)
Neutro Abs: 8.8 10*3/uL — ABNORMAL HIGH (ref 1.7–7.7)
Neutrophils Relative %: 77 % (ref 43–77)
Platelets: 429 10*3/uL — ABNORMAL HIGH (ref 150–400)
RBC: 3.4 MIL/uL — ABNORMAL LOW (ref 3.87–5.11)
RDW: 17.8 % — AB (ref 11.5–15.5)
WBC: 11.4 10*3/uL — ABNORMAL HIGH (ref 4.0–10.5)

## 2013-07-18 LAB — COMPREHENSIVE METABOLIC PANEL
ALBUMIN: 2.3 g/dL — AB (ref 3.5–5.2)
ALT: 7 U/L (ref 0–35)
AST: 12 U/L (ref 0–37)
Alkaline Phosphatase: 166 U/L — ABNORMAL HIGH (ref 39–117)
BUN: 11 mg/dL (ref 6–23)
CO2: 32 mEq/L (ref 19–32)
CREATININE: 0.63 mg/dL (ref 0.50–1.10)
Calcium: 9.2 mg/dL (ref 8.4–10.5)
Chloride: 92 mEq/L — ABNORMAL LOW (ref 96–112)
GFR calc Af Amer: >90 mL/min (ref >90)
GFR calc non Af Amer: >90 mL/min (ref >90)
Glucose, Bld: 104 mg/dL — ABNORMAL HIGH (ref 70–99)
Potassium: 3.1 mEq/L — ABNORMAL LOW (ref 3.7–5.3)
Sodium: 137 mEq/L (ref 137–147)
Total Bilirubin: 0.2 mg/dL — ABNORMAL LOW (ref 0.3–1.2)
Total Protein: 6.9 g/dL (ref 6.0–8.3)

## 2013-07-18 MED ORDER — HEPARIN SOD (PORK) LOCK FLUSH 100 UNIT/ML IV SOLN
INTRAVENOUS | Status: AC
  Filled 2013-07-18: qty 5

## 2013-07-18 MED ORDER — SODIUM CHLORIDE 0.9 % IJ SOLN
10.0000 mL | INTRAMUSCULAR | Status: DC | PRN
  Administered 2013-07-18: 10 mL via INTRAVENOUS

## 2013-07-18 MED ORDER — ONDANSETRON HCL 4 MG PO TABS
8.0000 mg | ORAL_TABLET | Freq: Once | ORAL | Status: AC
  Administered 2013-07-18: 8 mg via ORAL
  Filled 2013-07-18: qty 2

## 2013-07-18 MED ORDER — HYDROCODONE-ACETAMINOPHEN 5-325 MG PO TABS
1.5000 | ORAL_TABLET | Freq: Four times a day (QID) | ORAL | Status: DC | PRN
  Administered 2013-07-18: 1.5 via ORAL
  Filled 2013-07-18: qty 2

## 2013-07-18 MED ORDER — POTASSIUM CHLORIDE 2 MEQ/ML IV SOLN
INTRAVENOUS | Status: DC
  Administered 2013-07-18: 11:00:00 via INTRAVENOUS
  Filled 2013-07-18 (×10): qty 1000

## 2013-07-18 MED ORDER — HEPARIN SOD (PORK) LOCK FLUSH 100 UNIT/ML IV SOLN
500.0000 [IU] | Freq: Once | INTRAVENOUS | Status: AC
Start: 2013-07-18 — End: 2013-07-18
  Administered 2013-07-18: 500 [IU] via INTRAVENOUS

## 2013-07-18 NOTE — Progress Notes (Signed)
Left in c/o family for transport home.  Alert, in no apparent distress.  States, "I'm just tired and I want to go home.".

## 2013-07-20 ENCOUNTER — Other Ambulatory Visit (HOSPITAL_COMMUNITY): Payer: Self-pay | Admitting: Hematology and Oncology

## 2013-07-21 ENCOUNTER — Other Ambulatory Visit (HOSPITAL_COMMUNITY): Payer: Self-pay | Admitting: Hematology and Oncology

## 2013-07-21 DIAGNOSIS — C3410 Malignant neoplasm of upper lobe, unspecified bronchus or lung: Secondary | ICD-10-CM

## 2013-07-21 MED ORDER — METOCLOPRAMIDE HCL 5 MG PO TABS: ORAL_TABLET | ORAL | Status: DC

## 2013-07-26 ENCOUNTER — Other Ambulatory Visit (HOSPITAL_COMMUNITY): Payer: Self-pay | Admitting: Oncology

## 2013-07-26 DIAGNOSIS — R112 Nausea with vomiting, unspecified: Secondary | ICD-10-CM

## 2013-07-26 MED ORDER — LORAZEPAM 0.5 MG PO TABS: ORAL_TABLET | ORAL | Status: DC

## 2013-07-30 ENCOUNTER — Other Ambulatory Visit (HOSPITAL_COMMUNITY): Payer: Self-pay | Admitting: *Deleted

## 2013-07-30 ENCOUNTER — Encounter (HOSPITAL_COMMUNITY): Payer: Self-pay | Admitting: *Deleted

## 2013-07-30 DIAGNOSIS — C771 Secondary and unspecified malignant neoplasm of intrathoracic lymph nodes: Secondary | ICD-10-CM

## 2013-07-30 DIAGNOSIS — C3410 Malignant neoplasm of upper lobe, unspecified bronchus or lung: Secondary | ICD-10-CM

## 2013-07-30 MED ORDER — ONDANSETRON HCL 8 MG PO TABS: 8.0000 mg | ORAL_TABLET | Freq: Three times a day (TID) | ORAL | Status: DC | PRN

## 2013-07-31 ENCOUNTER — Other Ambulatory Visit (HOSPITAL_COMMUNITY): Payer: Self-pay | Admitting: Oncology

## 2013-08-01 ENCOUNTER — Ambulatory Visit (HOSPITAL_COMMUNITY): Payer: Managed Care, Other (non HMO)

## 2013-08-02 ENCOUNTER — Encounter (HOSPITAL_BASED_OUTPATIENT_CLINIC_OR_DEPARTMENT_OTHER): Payer: Managed Care, Other (non HMO)

## 2013-08-02 ENCOUNTER — Encounter (HOSPITAL_COMMUNITY): Payer: Managed Care, Other (non HMO)

## 2013-08-02 VITALS — BP 81/58 | HR 138 | Temp 97.7°F | Resp 18 | Wt 107.8 lb

## 2013-08-02 DIAGNOSIS — E876 Hypokalemia: Secondary | ICD-10-CM

## 2013-08-02 DIAGNOSIS — K219 Gastro-esophageal reflux disease without esophagitis: Secondary | ICD-10-CM

## 2013-08-02 DIAGNOSIS — J852 Abscess of lung without pneumonia: Secondary | ICD-10-CM

## 2013-08-02 DIAGNOSIS — J449 Chronic obstructive pulmonary disease, unspecified: Secondary | ICD-10-CM

## 2013-08-02 DIAGNOSIS — C3410 Malignant neoplasm of upper lobe, unspecified bronchus or lung: Secondary | ICD-10-CM

## 2013-08-02 DIAGNOSIS — D649 Anemia, unspecified: Secondary | ICD-10-CM

## 2013-08-02 DIAGNOSIS — C771 Secondary and unspecified malignant neoplasm of intrathoracic lymph nodes: Secondary | ICD-10-CM

## 2013-08-02 DIAGNOSIS — D709 Neutropenia, unspecified: Secondary | ICD-10-CM

## 2013-08-02 DIAGNOSIS — F17200 Nicotine dependence, unspecified, uncomplicated: Secondary | ICD-10-CM

## 2013-08-02 LAB — COMPREHENSIVE METABOLIC PANEL
ALT: 7 U/L (ref 0–35)
AST: 14 U/L (ref 0–37)
Albumin: 2.4 g/dL — ABNORMAL LOW (ref 3.5–5.2)
Alkaline Phosphatase: 158 U/L — ABNORMAL HIGH (ref 39–117)
BUN: 6 mg/dL (ref 6–23)
CO2: 29 mEq/L (ref 19–32)
Calcium: 9.3 mg/dL (ref 8.4–10.5)
Chloride: 98 mEq/L (ref 96–112)
Creatinine, Ser: 0.85 mg/dL (ref 0.50–1.10)
GFR calc Af Amer: >90 mL/min (ref >90)
GFR calc non Af Amer: 79 mL/min — ABNORMAL LOW (ref >90)
Glucose, Bld: 101 mg/dL — ABNORMAL HIGH (ref 70–99)
POTASSIUM: 3.8 meq/L (ref 3.7–5.3)
Sodium: 138 mEq/L (ref 137–147)
TOTAL PROTEIN: 6.6 g/dL (ref 6.0–8.3)
Total Bilirubin: 0.2 mg/dL — ABNORMAL LOW (ref 0.3–1.2)

## 2013-08-02 LAB — CBC WITH DIFFERENTIAL/PLATELET
BASOS ABS: 0 10*3/uL (ref 0.0–0.1)
BASOS PCT: 0 % (ref 0–1)
EOS ABS: 0.1 10*3/uL (ref 0.0–0.7)
Eosinophils Relative: 1 % (ref 0–5)
HEMATOCRIT: 36.9 % (ref 36.0–46.0)
HEMOGLOBIN: 11.9 g/dL — AB (ref 12.0–15.0)
Lymphocytes Relative: 15 % (ref 12–46)
Lymphs Abs: 1.4 10*3/uL (ref 0.7–4.0)
MCH: 33.2 pg (ref 26.0–34.0)
MCHC: 32.2 g/dL (ref 30.0–36.0)
MCV: 103.1 fL — ABNORMAL HIGH (ref 78.0–100.0)
MONO ABS: 0.6 10*3/uL (ref 0.1–1.0)
MONOS PCT: 7 % (ref 3–12)
NEUTROS ABS: 6.7 10*3/uL (ref 1.7–7.7)
Neutrophils Relative %: 76 % (ref 43–77)
Platelets: 362 10*3/uL (ref 150–400)
RBC: 3.58 MIL/uL — ABNORMAL LOW (ref 3.87–5.11)
RDW: 21.3 % — AB (ref 11.5–15.5)
WBC: 8.8 10*3/uL (ref 4.0–10.5)

## 2013-08-02 LAB — MAGNESIUM: MAGNESIUM: 1.9 mg/dL (ref 1.5–2.5)

## 2013-08-02 MED ORDER — HYDROCODONE-ACETAMINOPHEN 7.5-325 MG/15ML PO SOLN: ORAL | Status: DC

## 2013-08-02 MED ORDER — PREDNISONE 10 MG PO TABS: ORAL_TABLET | ORAL | Status: DC

## 2013-08-02 NOTE — Progress Notes (Signed)
Labs drawn today for cbc/diff,cmp,mg

## 2013-08-02 NOTE — Progress Notes (Signed)
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 Radiotherapy was completed on 07/18/2013.  INTERVAL HISTORY: Heather Coffey 53 y.o. female returns for followup of stage III squamous cell carcinoma lung, status post combined modality therapy with carboplatin Taxol weekly with daily radiation 5 days a week completed on 07/18/2013. She still has difficulty in swallowing and is using tube mouthwash along with hydrocodone liquid. Nausea is controlled with Xanax plus Zofran and Reglan. She denies a diarrhea, fever, night sweats, but does have cough productive of alternating yellow and clear sputum. She denies any hemoptysis. She denies dysuria, hematuria, and is able to drink fluids adequately. She takes 2 [employer] drinks per day. Patient denies any peripheral paresthesias.  MEDICAL HISTORY: Past Medical History  Diagnosis Date  . Arthritis   . Anxiety   . Hyperlipidemia   . Tobacco abuse   . Shortness of breath     Hx: of breath with exertion  . Depression   . GERD (gastroesophageal reflux disease)   . H/O hiatal hernia   . Lung cancer     INTERIM HISTORY: has Lung cancer, lingula; Metastasis to  mediastinal lymph node; Chronic obstructive pulmonary disease; Cholelithiasis; Abscess of lung(513.0), history of; and GERD (gastroesophageal reflux disease) on her problem list.    ALLERGIES:  is allergic to augmentin; codeine; and tetracyclines & related.  MEDICATIONS: has a current medication list which includes the following prescription(s): albuterol, aspirin ec, cyclobenzaprine, first-dukes mouthwash, esomeprazole, fluticasone, fluticasone-salmeterol, guaifenesin, hydrocodone-acetaminophen, lidocaine-prilocaine, loratadine-pseudoephedrine, lorazepam, metoclopramide, montelukast, multivitamin, ondansetron, potassium chloride sa, pravastatin, sucralfate, alprazolam, fluoxetine, nystatin, prednisone, and triamcinolone cream, and the following Facility-Administered Medications: sodium chloride.  SURGICAL HISTORY:  Past Surgical History  Procedure Laterality Date  . Tubal ligation    . Breast lumpectomy      Hx: of  . Video bronchoscopy with endobronchial ultrasound N/A 05/03/2013    Procedure: VIDEO BRONCHOSCOPY WITH ENDOBRONCHIAL ULTRASOUND;  Surgeon: Melrose Nakayama, MD;  Location: [facility];  Service: Thoracic;  Laterality: N/A;  . Abdominal hysterectomy      still has ovaries per pt  . Nasal septum surgery    . Portacath placement Left 05/21/2013    Procedure: INSERTION PORT-A-CATH;  Surgeon: Melrose Nakayama, MD;  Location: [facility] OR;  Service: Thoracic;  Laterality: Left;    FAMILY HISTORY: family history includes COPD in her brother, father, mother, and sister; Cancer in her sister; Diabetes in her sister; Hypercholesterolemia in her mother.  SOCIAL HISTORY:  reports that she has been smoking Cigarettes.  She has a 17.5 pack-year smoking history. She has never used smokeless tobacco. She reports that she does not drink alcohol or use illicit drugs.  REVIEW OF SYSTEMS:  Other than that discussed above is noncontributory.  PHYSICAL EXAMINATION: ECOG PERFORMANCE STATUS: 2 -  Symptomatic, <50% confined to bed  Blood pressure 81/58, pulse 138, temperature 97.7 F (36.5 C), temperature source Oral, resp. rate 18, weight 107 lb 12.8 oz (48.898 kg), SpO2 97.00%.  GENERAL:alert, no distress and comfortable SKIN: skin color, texture, turgor are normal, no rashes or significant lesions EYES: PERLA; Conjunctiva are pink and non-injected, sclera clear SINUSES: No redness or tenderness over maxillary or ethmoid sinuses OROPHARYNX:no exudate, no erythema on lips, buccal mucosa, or tongue. No plaques or vesicles. NECK: supple, thyroid normal size, non-tender, without nodularity. No masses CHEST: Increased AP diameter with no breast masses. LifePort in place. LYMPH:  no palpable lymphadenopathy in the cervical, axillary or inguinal LUNGS: clear to auscultation and percussion with normal breathing effort HEART: regular rate & rhythm and no murmurs. ABDOMEN:abdomen soft, non-tender and normal bowel sounds. Poor skin turgor. MUSCULOSKELETAL:no cyanosis of digits and no clubbing. Range of motion normal.  NEURO: alert & oriented x 3 with fluent speech, no focal motor/sensory deficits   LABORATORY DATA: Office Visit on 08/02/2013  Component Date Value Ref Range Status  . WBC 08/02/2013 8.8  4.0 - 10.5 K/uL Final  . RBC 08/02/2013 3.58* 3.87 - 5.11 MIL/uL Final  . Hemoglobin 08/02/2013 11.9* 12.0 - 15.0 g/dL Final  . HCT 08/02/2013 36.9  36.0 - 46.0 % Final  . MCV 08/02/2013 103.1* 78.0 - 100.0 fL Final  . MCH 08/02/2013 33.2  26.0 - 34.0 pg Final  . MCHC 08/02/2013 32.2  30.0 - 36.0 g/dL Final  . RDW 08/02/2013 21.3* 11.5 - 15.5 % Final  . Platelets 08/02/2013 362  150 - 400 K/uL Final  . Neutrophils Relative % 08/02/2013 76  43 - 77 % Final  . Neutro Abs 08/02/2013 6.7  1.7 - 7.7 K/uL Final  . Lymphocytes Relative 08/02/2013 15  12 - 46 % Final  . Lymphs Abs 08/02/2013 1.4  0.7 - 4.0 K/uL Final  . Monocytes Relative 08/02/2013 7  3 - 12 % Final  . Monocytes Absolute  08/02/2013 0.6  0.1 - 1.0 K/uL Final  . Eosinophils Relative 08/02/2013 1  0 - 5 % Final  . Eosinophils Absolute 08/02/2013 0.1  0.0 - 0.7 K/uL Final  . Basophils Relative 08/02/2013 0  0 - 1 % Final  . Basophils Absolute 08/02/2013 0.0  0.0 - 0.1 K/uL Final  . WBC Morphology 08/02/2013 ATYPICAL LYMPHOCYTES   Final  . Sodium 08/02/2013 138  137 - 147 mEq/L Final  . Potassium 08/02/2013 3.8  3.7 - 5.3 mEq/L Final  . Chloride 08/02/2013 98  96 - 112 mEq/L Final  . CO2 08/02/2013 29  19 - 32 mEq/L Final  . Glucose, Bld 08/02/2013 101* 70 - 99 mg/dL Final  . BUN 08/02/2013 6  6 - 23 mg/dL Final  . Creatinine, Ser 08/02/2013 0.85  0.50 - 1.10 mg/dL Final  . Calcium 08/02/2013 9.3  8.4 - 10.5 mg/dL Final  . Total Protein 08/02/2013 6.6  6.0 - 8.3 g/dL Final  . Albumin 08/02/2013 2.4* 3.5 - 5.2 g/dL Final  . AST 08/02/2013 14  0 - 37 U/L Final  . ALT 08/02/2013 7  0 - 35 U/L Final  . Alkaline Phosphatase 08/02/2013 158* 39 - 117 U/L Final  . Total Bilirubin 08/02/2013 0.2* 0.3 - 1.2 mg/dL Final  . GFR calc non Af Amer 08/02/2013 79* >90 mL/min Final  . GFR calc Af Amer 08/02/2013 >90  >90 mL/min Final   Comment: (NOTE)  The eGFR has been calculated using the CKD EPI equation.                          This calculation has not been validated in all clinical situations.                          eGFR's persistently <90 mL/min signify possible Chronic Kidney                          Disease.  . Magnesium 08/02/2013 1.9  1.5 - 2.5 mg/dL Final  Infusion on 07/18/2013  Component Date Value Ref Range Status  . WBC 07/18/2013 11.4* 4.0 - 10.5 K/uL Final  . RBC 07/18/2013 3.40* 3.87 - 5.11 MIL/uL Final  . Hemoglobin 07/18/2013 10.7* 12.0 - 15.0 g/dL Final  . HCT 07/18/2013 32.4* 36.0 - 46.0 % Final  . MCV 07/18/2013 95.3  78.0 - 100.0 fL Final  . MCH 07/18/2013 31.5  26.0 - 34.0 pg Final  . MCHC 07/18/2013 33.0  30.0 - 36.0 g/dL Final  . RDW 07/18/2013 17.8* 11.5 - 15.5 %  Final  . Platelets 07/18/2013 429* 150 - 400 K/uL Final  . Neutrophils Relative % 07/18/2013 77  43 - 77 % Final  . Neutro Abs 07/18/2013 8.8* 1.7 - 7.7 K/uL Final  . Lymphocytes Relative 07/18/2013 7* 12 - 46 % Final  . Lymphs Abs 07/18/2013 0.8  0.7 - 4.0 K/uL Final  . Monocytes Relative 07/18/2013 16* 3 - 12 % Final  . Monocytes Absolute 07/18/2013 1.9* 0.1 - 1.0 K/uL Final  . Eosinophils Relative 07/18/2013 0  0 - 5 % Final  . Eosinophils Absolute 07/18/2013 0.0  0.0 - 0.7 K/uL Final  . Basophils Relative 07/18/2013 0  0 - 1 % Final  . Basophils Absolute 07/18/2013 0.0  0.0 - 0.1 K/uL Final  . Sodium 07/18/2013 137  137 - 147 mEq/L Final  . Potassium 07/18/2013 3.1* 3.7 - 5.3 mEq/L Final  . Chloride 07/18/2013 92* 96 - 112 mEq/L Final  . CO2 07/18/2013 32  19 - 32 mEq/L Final  . Glucose, Bld 07/18/2013 104* 70 - 99 mg/dL Final  . BUN 07/18/2013 11  6 - 23 mg/dL Final  . Creatinine, Ser 07/18/2013 0.63  0.50 - 1.10 mg/dL Final  . Calcium 07/18/2013 9.2  8.4 - 10.5 mg/dL Final  . Total Protein 07/18/2013 6.9  6.0 - 8.3 g/dL Final  . Albumin 07/18/2013 2.3* 3.5 - 5.2 g/dL Final  . AST 07/18/2013 12  0 - 37 U/L Final  . ALT 07/18/2013 7  0 - 35 U/L Final  . Alkaline Phosphatase 07/18/2013 166* 39 - 117 U/L Final  . Total Bilirubin 07/18/2013 0.2* 0.3 - 1.2 mg/dL Final  . GFR calc non Af Amer 07/18/2013 >90  >90 mL/min Final  . GFR calc Af Amer 07/18/2013 >90  >90 mL/min Final   Comment: (NOTE)                          The eGFR has been calculated using the CKD EPI equation.                          This calculation has not been validated in all clinical situations.  eGFR's persistently <90 mL/min signify possible Chronic Kidney                          Disease.  Infusion on 07/04/2013  Component Date Value Ref Range Status  . WBC 07/04/2013 1.3* 4.0 - 10.5 K/uL Final   Comment: RESULT REPEATED AND VERIFIED                          WHITE COUNT CONFIRMED ON  SMEAR                          CRITICAL RESULT CALLED TO, READ BACK BY AND VERIFIED WITH:                          JENNIFER DOYLE RN ON [id number] AT 1230 BY RESSEGGER R  . RBC 07/04/2013 3.49* 3.87 - 5.11 MIL/uL Final  . Hemoglobin 07/04/2013 10.7* 12.0 - 15.0 g/dL Final  . HCT 07/04/2013 32.5* 36.0 - 46.0 % Final  . MCV 07/04/2013 93.1  78.0 - 100.0 fL Final  . MCH 07/04/2013 30.7  26.0 - 34.0 pg Final  . MCHC 07/04/2013 32.9  30.0 - 36.0 g/dL Final  . RDW 07/04/2013 16.0* 11.5 - 15.5 % Final  . Platelets 07/04/2013 169  150 - 400 K/uL Final  . Neutrophils Relative % 07/04/2013 56  43 - 77 % Final  . Neutro Abs 07/04/2013 0.7* 1.7 - 7.7 K/uL Final  . Lymphocytes Relative 07/04/2013 30  12 - 46 % Final  . Lymphs Abs 07/04/2013 0.4* 0.7 - 4.0 K/uL Final  . Monocytes Relative 07/04/2013 12  3 - 12 % Final  . Monocytes Absolute 07/04/2013 0.2  0.1 - 1.0 K/uL Final  . Eosinophils Relative 07/04/2013 2  0 - 5 % Final  . Eosinophils Absolute 07/04/2013 0.0  0.0 - 0.7 K/uL Final  . Basophils Relative 07/04/2013 1  0 - 1 % Final  . Basophils Absolute 07/04/2013 0.0  0.0 - 0.1 K/uL Final  . Sodium 07/04/2013 137  137 - 147 mEq/L Final  . Potassium 07/04/2013 3.0* 3.7 - 5.3 mEq/L Final  . Chloride 07/04/2013 97  96 - 112 mEq/L Final  . CO2 07/04/2013 29  19 - 32 mEq/L Final  . Glucose, Bld 07/04/2013 124* 70 - 99 mg/dL Final  . BUN 07/04/2013 13  6 - 23 mg/dL Final  . Creatinine, Ser 07/04/2013 0.89  0.50 - 1.10 mg/dL Final  . Calcium 07/04/2013 9.1  8.4 - 10.5 mg/dL Final  . GFR calc non Af Amer 07/04/2013 74* >90 mL/min Final  . GFR calc Af Amer 07/04/2013 86* >90 mL/min Final   Comment: (NOTE)                          The eGFR has been calculated using the CKD EPI equation.                          This calculation has not been validated in all clinical situations.                          eGFR's persistently <90 mL/min signify possible Chronic Kidney  Disease.     PATHOLOGY: Squamous cell carcinoma  Urinalysis No results found for this basename: colorurine,  appearanceur,  labspec,  phurine,  glucoseu,  hgbur,  bilirubinur,  ketonesur,  proteinur,  urobilinogen,  nitrite,  leukocytesur    RADIOGRAPHIC STUDIES: No results found.  ASSESSMENT: .1. 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 Chronic obstructive pulmonary disease, unfortunately still smoking.  3. History of right lung abscess, status post medical treatment only occurring about 3 or 4 years ago requiring hospitalization and intravenous antibiotics with no antecedent history of aspiration or loss of consciousness.  4. Gastroesophageal reflux disease, controlled  5. Possible early radiation pneumonitis  6. Anemia and neutropenia secondary to combined modality therapy.  7. Esophagitis secondary to treatment.    PLAN:  #1. Prednisone 20 mg each morning. #2. Resume Prozac 20 mg daily and takes Xanax as well if needed for nausea and/ or anxiety. #3. Continue [employer] Breakfast 2 or 3 per day. #4. Continue hydrocodone liquid and [employer] mouthwash with swallowing for dysphagia and odynophagia. #5. Repeat CT scans of the chest abdomen and pelvis with contrast in 2 weeks. #6. Office visit 3 weeks with CBC, chem profile, LDH.    All questions were answered. The patient knows to call the clinic with any problems, questions or concerns. We can certainly see the patient much sooner if necessary.   I spent 25 minutes counseling the patient face to face. The total time spent in the appointment was 30 minutes.    Farrel Gobble, MD 08/02/2013 1:46 PM  DISCLAIMER:  This note was dictated with voice recognition software.  Similar sounding words can inadvertently be  transcribed inaccurately and may not be corrected upon review.

## 2013-08-02 NOTE — Patient Instructions (Addendum)
[facility] [place] [facility] Discharge Instructions  RECOMMENDATIONS MADE BY THE CONSULTANT AND ANY TEST RESULTS WILL BE SENT TO YOUR REFERRING PHYSICIAN.  EXAM FINDINGS BY THE PHYSICIAN TODAY AND SIGNS OR SYMPTOMS TO REPORT TO CLINIC OR PRIMARY PHYSICIAN:   Prednisone 10mg  tablets. Take 2 tablets (20mg ) every morning with food. Escribed to [employer].  Dr. Wanda Plump is refilling your Hydrocodone liquid. (Prescription given)  Start back taking Xanax & Prozac.   Stop taking Ativan.   Labs today.  (done)  CT scan chest abdomen pelvis in 2 weeks. Arrive 2 hours (8am) prior to your appt time (10:150 to drink the water-like contrast. Nothing to eat or drink once you start the contrast here @ [facility].     Thank you for choosing [facility] to provide your oncology and hematology care.  To afford each patient quality time with our providers, please arrive at least 15 minutes before your scheduled appointment time.  With your help, our goal is to use those 15 minutes to complete the necessary work-up to ensure our physicians have the information they need to help with your evaluation and healthcare recommendations.    Effective January 1st, 2014, we ask that you re-schedule your appointment with our physicians should you arrive 10 or more minutes late for your appointment.  We strive to give you quality time with our providers, and arriving late affects you and other patients whose appointments are after yours.    Again, thank you for choosing [facility].  Our hope is that these requests will decrease the amount of time that you wait before being seen by our physicians.       _____________________________________________________________  Should you have questions after your visit to [facility], please contact our office at [phone] between the hours of 8:30 a.m. and 5:00 p.m.  Voicemails left after 4:30 p.m. will not be returned until the  following business day.  For prescription refill requests, have your pharmacy contact our office with your prescription refill request.

## 2013-08-06 ENCOUNTER — Other Ambulatory Visit (HOSPITAL_COMMUNITY): Payer: Self-pay | Admitting: *Deleted

## 2013-08-06 ENCOUNTER — Other Ambulatory Visit (HOSPITAL_COMMUNITY): Payer: Self-pay | Admitting: Oncology

## 2013-08-06 DIAGNOSIS — C3410 Malignant neoplasm of upper lobe, unspecified bronchus or lung: Secondary | ICD-10-CM

## 2013-08-06 DIAGNOSIS — R112 Nausea with vomiting, unspecified: Secondary | ICD-10-CM

## 2013-08-06 MED ORDER — METOCLOPRAMIDE HCL 10 MG PO TABS: 10.0000 mg | ORAL_TABLET | Freq: Four times a day (QID) | ORAL | Status: DC

## 2013-08-06 MED ORDER — LORAZEPAM 0.5 MG PO TABS: ORAL_TABLET | ORAL | Status: DC

## 2013-08-06 NOTE — Addendum Note (Signed)
Addended by: Gerhard Perches on: 08/06/2013 02:20 PM   Modules accepted: Orders

## 2013-08-07 ENCOUNTER — Encounter: Payer: Self-pay | Admitting: Internal Medicine

## 2013-08-08 ENCOUNTER — Other Ambulatory Visit (HOSPITAL_COMMUNITY): Payer: Self-pay | Admitting: Oncology

## 2013-08-08 DIAGNOSIS — C771 Secondary and unspecified malignant neoplasm of intrathoracic lymph nodes: Secondary | ICD-10-CM

## 2013-08-08 DIAGNOSIS — C3410 Malignant neoplasm of upper lobe, unspecified bronchus or lung: Secondary | ICD-10-CM

## 2013-08-08 MED ORDER — ONDANSETRON HCL 8 MG PO TABS: 8.0000 mg | ORAL_TABLET | Freq: Three times a day (TID) | ORAL | Status: DC | PRN

## 2013-08-16 ENCOUNTER — Ambulatory Visit (HOSPITAL_COMMUNITY)
Admission: RE | Admit: 2013-08-16 | Discharge: 2013-08-16 | Disposition: A | Discharge status: Home / Self Care | Payer: Managed Care, Other (non HMO) | Source: Ambulatory Visit | Attending: Hematology and Oncology | Admitting: Hematology and Oncology

## 2013-08-16 DIAGNOSIS — C3490 Malignant neoplasm of unspecified part of unspecified bronchus or lung: Secondary | ICD-10-CM | POA: Insufficient documentation

## 2013-08-16 DIAGNOSIS — C3410 Malignant neoplasm of upper lobe, unspecified bronchus or lung: Secondary | ICD-10-CM

## 2013-08-16 DIAGNOSIS — Z923 Personal history of irradiation: Secondary | ICD-10-CM | POA: Insufficient documentation

## 2013-08-16 MED ORDER — IOHEXOL 300 MG/ML  SOLN
100.0000 mL | Freq: Once | INTRAMUSCULAR | Status: AC | PRN
  Administered 2013-08-16: 100 mL via INTRAVENOUS

## 2013-08-17 ENCOUNTER — Encounter (HOSPITAL_COMMUNITY): Payer: Self-pay

## 2013-08-17 ENCOUNTER — Encounter (HOSPITAL_COMMUNITY): Payer: Managed Care, Other (non HMO) | Attending: Hematology and Oncology

## 2013-08-17 VITALS — BP 116/78 | HR 94 | Temp 98.2°F | Resp 20 | Wt 109.2 lb

## 2013-08-17 DIAGNOSIS — C771 Secondary and unspecified malignant neoplasm of intrathoracic lymph nodes: Secondary | ICD-10-CM | POA: Insufficient documentation

## 2013-08-17 DIAGNOSIS — K208 Other esophagitis without bleeding: Secondary | ICD-10-CM

## 2013-08-17 DIAGNOSIS — R1310 Dysphagia, unspecified: Secondary | ICD-10-CM

## 2013-08-17 DIAGNOSIS — K219 Gastro-esophageal reflux disease without esophagitis: Secondary | ICD-10-CM

## 2013-08-17 DIAGNOSIS — F17200 Nicotine dependence, unspecified, uncomplicated: Secondary | ICD-10-CM

## 2013-08-17 DIAGNOSIS — C3410 Malignant neoplasm of upper lobe, unspecified bronchus or lung: Secondary | ICD-10-CM | POA: Insufficient documentation

## 2013-08-17 DIAGNOSIS — J4489 Other specified chronic obstructive pulmonary disease: Secondary | ICD-10-CM

## 2013-08-17 DIAGNOSIS — J449 Chronic obstructive pulmonary disease, unspecified: Secondary | ICD-10-CM

## 2013-08-17 DIAGNOSIS — R630 Anorexia: Secondary | ICD-10-CM

## 2013-08-17 NOTE — Progress Notes (Signed)
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 Lung Cancer    CURRENT THERAPY: Weekly carboplatin and Taxol with daily radiotherapy for stage III squamous cell carcinoma lung, last chemotherapy on 07/04/2013 with radiotherapy completed on 07/18/2013.  INTERVAL HISTORY: Heather Coffey 53 y.o. female returns for followup after completion of combined modality therapy for stage III squamous cell carcinoma lung with recent weakness due to dysphagia and anorexia. Swallowing is better but still with occasional nausea is not relieved 100% by Zofran, Reglan, and Xanax. She continues to use Magic mouthwash. She denies any fever, night sweats, expectoration, diarrhea, constipation, dysuria, hematuria, peripheral paresthesias, lower extremity swelling or redness, skin rash, headache, or seizures. She continues to smoke.    MEDICAL HISTORY: Past Medical History  Diagnosis Date  . Arthritis   . Anxiety   . Hyperlipidemia   . Tobacco abuse   . Shortness of breath     Hx: of breath with exertion  . Depression   . GERD (gastroesophageal reflux disease)   . H/O hiatal hernia   . 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    ALLERGIES:  is allergic to augmentin; codeine; and tetracyclines &  related.  MEDICATIONS: has a current medication list which includes the following prescription(s): albuterol, alprazolam, aspirin ec, cyclobenzaprine, first-dukes mouthwash, esomeprazole, fluoxetine, fluticasone, fluticasone-salmeterol, guaifenesin, hydrocodone-acetaminophen, lidocaine-prilocaine, loratadine-pseudoephedrine, lorazepam, metoclopramide, montelukast, multivitamin, nystatin, ondansetron, potassium chloride sa, pravastatin, prednisone, sucralfate, and triamcinolone cream, and the following Facility-Administered Medications: sodium chloride.  SURGICAL HISTORY:  Past Surgical History  Procedure Laterality Date  . Tubal ligation    . Breast lumpectomy      Hx: of  . Video bronchoscopy with endobronchial ultrasound N/A 05/03/2013    Procedure: VIDEO BRONCHOSCOPY WITH ENDOBRONCHIAL ULTRASOUND;  Surgeon: Melrose Nakayama, MD;  Location: [facility];  Service: Thoracic;  Laterality: N/A;  . Abdominal hysterectomy      still has ovaries per pt  . Nasal septum surgery    . Portacath placement Left 05/21/2013    Procedure: INSERTION PORT-A-CATH;  Surgeon: Melrose Nakayama, MD;  Location: [facility] OR;  Service: Thoracic;  Laterality: Left;    FAMILY HISTORY: family history includes COPD in her brother, father, mother, and sister; Cancer in her sister; Diabetes in her sister; Hypercholesterolemia in her mother.  SOCIAL HISTORY:  reports that she has been smoking Cigarettes.  She has a 17.5 pack-year smoking history. She has never used smokeless tobacco. She reports that she does not drink alcohol or use illicit drugs.  REVIEW OF SYSTEMS:  Other than that discussed above is noncontributory.  PHYSICAL EXAMINATION: ECOG PERFORMANCE STATUS: 1 - Symptomatic but completely ambulatory  Blood pressure 116/78, pulse 94, temperature 98.2 F (36.8 C), temperature source Oral, resp. rate 20, weight 109 lb 3.2 oz (49.533 kg).  GENERAL:alert, no distress and comfortable. No appreciable alopecia. SKIN:  skin color, texture, turgor are normal, no rashes or significant  lesions EYES: PERLA; Conjunctiva are pink and non-injected, sclera clear SINUSES: No redness or tenderness over maxillary or ethmoid sinuses OROPHARYNX:no exudate, no erythema on lips, buccal mucosa, or tongue. NECK: supple, thyroid normal size, non-tender, without nodularity. No masses CHEST: Increased AP diameter with clear lung pills bilaterally. LYMPH:  no palpable lymphadenopathy in the cervical, axillary or inguinal LUNGS: clear to auscultation and percussion with normal breathing effort HEART: regular rate & rhythm and no murmurs. ABDOMEN:abdomen soft, non-tender and normal bowel sounds. No tenting of the skin. MUSCULOSKELETAL:no cyanosis of digits and no clubbing. Range of motion normal.  NEURO: alert & oriented x 3 with fluent speech, no focal motor/sensory deficits   LABORATORY DATA: Office Visit on 08/02/2013  Component Date Value Ref Range Status  . WBC 08/02/2013 8.8  4.0 - 10.5 K/uL Final  . RBC 08/02/2013 3.58* 3.87 - 5.11 MIL/uL Final  . Hemoglobin 08/02/2013 11.9* 12.0 - 15.0 g/dL Final  . HCT 08/02/2013 36.9  36.0 - 46.0 % Final  . MCV 08/02/2013 103.1* 78.0 - 100.0 fL Final  . MCH 08/02/2013 33.2  26.0 - 34.0 pg Final  . MCHC 08/02/2013 32.2  30.0 - 36.0 g/dL Final  . RDW 08/02/2013 21.3* 11.5 - 15.5 % Final  . Platelets 08/02/2013 362  150 - 400 K/uL Final  . Neutrophils Relative % 08/02/2013 76  43 - 77 % Final  . Neutro Abs 08/02/2013 6.7  1.7 - 7.7 K/uL Final  . Lymphocytes Relative 08/02/2013 15  12 - 46 % Final  . Lymphs Abs 08/02/2013 1.4  0.7 - 4.0 K/uL Final  . Monocytes Relative 08/02/2013 7  3 - 12 % Final  . Monocytes Absolute 08/02/2013 0.6  0.1 - 1.0 K/uL Final  . Eosinophils Relative 08/02/2013 1  0 - 5 % Final  . Eosinophils Absolute 08/02/2013 0.1  0.0 - 0.7 K/uL Final  . Basophils Relative 08/02/2013 0  0 - 1 % Final  . Basophils Absolute 08/02/2013 0.0  0.0 - 0.1 K/uL Final  .  WBC Morphology 08/02/2013 ATYPICAL LYMPHOCYTES   Final  . Sodium 08/02/2013 138  137 - 147 mEq/L Final  . Potassium 08/02/2013 3.8  3.7 - 5.3 mEq/L Final  . Chloride 08/02/2013 98  96 - 112 mEq/L Final  . CO2 08/02/2013 29  19 - 32 mEq/L Final  . Glucose, Bld 08/02/2013 101* 70 - 99 mg/dL Final  . BUN 08/02/2013 6  6 - 23 mg/dL Final  . Creatinine, Ser 08/02/2013 0.85  0.50 - 1.10 mg/dL Final  . Calcium 08/02/2013 9.3  8.4 - 10.5 mg/dL Final  . Total Protein 08/02/2013 6.6  6.0 - 8.3 g/dL Final  . Albumin 08/02/2013 2.4* 3.5 - 5.2 g/dL Final  . AST 08/02/2013 14  0 - 37 U/L Final  . ALT 08/02/2013 7  0 - 35 U/L Final  . Alkaline Phosphatase 08/02/2013 158* 39 - 117 U/L Final  . Total Bilirubin 08/02/2013 0.2* 0.3 - 1.2 mg/dL Final  . GFR calc non Af Amer 08/02/2013 79* >90 mL/min Final  . GFR calc Af Amer 08/02/2013 >90  >90 mL/min Final   Comment: (NOTE)                          The eGFR has been calculated using the CKD EPI equation.  This calculation has not been validated in all clinical situations.                          eGFR's persistently <90 mL/min signify possible Chronic Kidney                          Disease.  . Magnesium 08/02/2013 1.9  1.5 - 2.5 mg/dL Final    PATHOLOGY: Squamous cell carcinoma.  Urinalysis No results found for this basename: colorurine,  appearanceur,  labspec,  phurine,  glucoseu,  hgbur,  bilirubinur,  ketonesur,  proteinur,  urobilinogen,  nitrite,  leukocytesur    RADIOGRAPHIC STUDIES: Ct Chest W Contrast  08/16/2013   CLINICAL DATA:  Followup left lung carcinoma. Recently completed radiation therapy.  EXAM: CT CHEST, ABDOMEN, AND PELVIS WITH CONTRAST  TECHNIQUE: Multidetector CT imaging of the chest, abdomen and pelvis was performed following the standard protocol during bolus administration of intravenous contrast.  CONTRAST:  168m OMNIPAQUE IOHEXOL 300 MG/ML  SOLN  COMPARISON:  PET-CT on 05/01/2013  FINDINGS: CT CHEST  FINDINGS  Previously seen central lingular mass which is contiguous with the left hilum has nearly completely resolved, with only a thin walled cavitary lesion remaining at this location. No measurable residual mass lesion is seen. There is increased opacity in the peripheral lingula and left upper lobe, most consistent with radiation pneumonitis.  Previously seen mediastinal lymphadenopathy in the lateral aortic region and AP window shows interval resolution with no residual measurable lymphadenopathy present. No other sites of lymphadenopathy identified within the thorax.  Mild scarring in the right upper lobe is stable. Associated small sub-cm nodular densities in the right upper lobe are also unchanged. No new or enlarging pulmonary nodules or masses are identified. No evidence of pleural or pericardial effusion. No suspicious bone lesions identified.  CT ABDOMEN AND PELVIS FINDINGS  The abdominal parenchymal organs including the adrenal glands are normal in appearance. No soft tissue masses or lymphadenopathy identified within the abdomen or pelvis.  Prior hysterectomy noted. Adnexal regions are unremarkable. No evidence of inflammatory process or abnormal fluid collections. No evidence of bowel obstruction. Tiny gallbladder calcifications again noted, without evidence of cholecystitis or biliary ductal dilatation. No suspicious bone lesions identified.  IMPRESSION: Near complete resolution of central lingular/left hilar mass, with only thin walled cavitary lesion remaining at this location.  Interval resolution of mediastinal lymphadenopathy.  No new or progressive disease within the thorax. No evidence of abdominal or pelvic metastatic disease.   Electronically Signed   By: JEarle GellM.D.   On: 08/16/2013 10:39   Ct Abdomen Pelvis W Contrast  08/16/2013   CLINICAL DATA:  Followup left lung carcinoma. Recently completed radiation therapy.  EXAM: CT CHEST, ABDOMEN, AND PELVIS WITH CONTRAST  TECHNIQUE:  Multidetector CT imaging of the chest, abdomen and pelvis was performed following the standard protocol during bolus administration of intravenous contrast.  CONTRAST:  1066mOMNIPAQUE IOHEXOL 300 MG/ML  SOLN  COMPARISON:  PET-CT on 05/01/2013  FINDINGS: CT CHEST FINDINGS  Previously seen central lingular mass which is contiguous with the left hilum has nearly completely resolved, with only a thin walled cavitary lesion remaining at this location. No measurable residual mass lesion is seen. There is increased opacity in the peripheral lingula and left upper lobe, most consistent with radiation pneumonitis.  Previously seen mediastinal lymphadenopathy in the lateral aortic region and AP window shows interval resolution with no  residual measurable lymphadenopathy present. No other sites of lymphadenopathy identified within the thorax.  Mild scarring in the right upper lobe is stable. Associated small sub-cm nodular densities in the right upper lobe are also unchanged. No new or enlarging pulmonary nodules or masses are identified. No evidence of pleural or pericardial effusion. No suspicious bone lesions identified.  CT ABDOMEN AND PELVIS FINDINGS  The abdominal parenchymal organs including the adrenal glands are normal in appearance. No soft tissue masses or lymphadenopathy identified within the abdomen or pelvis.  Prior hysterectomy noted. Adnexal regions are unremarkable. No evidence of inflammatory process or abnormal fluid collections. No evidence of bowel obstruction. Tiny gallbladder calcifications again noted, without evidence of cholecystitis or biliary ductal dilatation. No suspicious bone lesions identified.  IMPRESSION: Near complete resolution of central lingular/left hilar mass, with only thin walled cavitary lesion remaining at this location.  Interval resolution of mediastinal lymphadenopathy.  No new or progressive disease within the thorax. No evidence of abdominal or pelvic metastatic disease.    Electronically Signed   By: Earle Gell M.D.   On: 08/16/2013 10:39    ASSESSMENT:  #1.. 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 Chronic obstructive pulmonary disease, unfortunately still smoking.  3. History of right lung abscess, status post medical treatment only occurring about 3 or 4 years ago requiring hospitalization and intravenous antibiotics with no antecedent history of aspiration or loss of consciousness.  4. Gastroesophageal reflux disease, controlled  5. Esophagitis secondary to treatment    PLAN:  #1. Discontinue smoking. #2. Discontinue Zofran but continue on Reglan, Xanax, and Magic mouthwash. #3. Followup in 4 weeks with CBC and chem profile. Plan is to repeat CT scans in 3 months.   All questions were answered. The patient knows to call the clinic with any problems, questions or concerns. We can certainly see the patient much sooner if necessary.   I spent 25 minutes counseling the patient face to face. The total time spent in the appointment was 30 minutes.    Farrel Gobble, MD 08/17/2013 10:51 AM  DISCLAIMER:  This note was dictated with voice recognition software.  Similar sounding words can inadvertently be transcribed inaccurately and may not be corrected upon review.

## 2013-08-17 NOTE — Patient Instructions (Signed)
[facility] Discharge Instructions  RECOMMENDATIONS MADE BY THE CONSULTANT AND ANY TEST RESULTS WILL BE SENT TO YOUR REFERRING PHYSICIAN.  EXAM FINDINGS BY THE PHYSICIAN TODAY AND SIGNS OR SYMPTOMS TO REPORT TO CLINIC OR PRIMARY PHYSICIAN:  Return in 1 month for labs and then to see Dr. Barnet Glasgow --- Monday June 16 @ 10:30 (port flush with labs & see MD)  Stop Zofran. Continue Reglan & Xanax & Magic Mouthwash.     Thank you for choosing [facility] to provide your oncology and hematology care.  To afford each patient quality time with our providers, please arrive at least 15 minutes before your scheduled appointment time.  With your help, our goal is to use those 15 minutes to complete the necessary work-up to ensure our physicians have the information they need to help with your evaluation and healthcare recommendations.    Effective January 1st, 2014, we ask that you re-schedule your appointment with our physicians should you arrive 10 or more minutes late for your appointment.  We strive to give you quality time with our providers, and arriving late affects you and other patients whose appointments are after yours.    Again, thank you for choosing [facility].  Our hope is that these requests will decrease the amount of time that you wait before being seen by our physicians.       _____________________________________________________________  Should you have questions after your visit to [facility], please contact our office at [phone] between the hours of 8:30 a.m. and 5:00 p.m.  Voicemails left after 4:30 p.m. will not be returned until the following business day.  For prescription refill requests, have your pharmacy contact our office with your prescription refill request.

## 2013-09-11 ENCOUNTER — Ambulatory Visit (INDEPENDENT_AMBULATORY_CARE_PROVIDER_SITE_OTHER): Payer: Managed Care, Other (non HMO) | Admitting: Gastroenterology

## 2013-09-11 ENCOUNTER — Encounter: Payer: Self-pay | Admitting: Gastroenterology

## 2013-09-11 ENCOUNTER — Encounter (INDEPENDENT_AMBULATORY_CARE_PROVIDER_SITE_OTHER): Payer: Self-pay

## 2013-09-11 ENCOUNTER — Other Ambulatory Visit: Payer: Self-pay | Admitting: Internal Medicine

## 2013-09-11 VITALS — BP 103/67 | HR 91 | Temp 99.2°F | Resp 16 | Ht 66.0 in | Wt 118.0 lb

## 2013-09-11 DIAGNOSIS — R1310 Dysphagia, unspecified: Secondary | ICD-10-CM | POA: Insufficient documentation

## 2013-09-11 DIAGNOSIS — K219 Gastro-esophageal reflux disease without esophagitis: Secondary | ICD-10-CM

## 2013-09-11 DIAGNOSIS — R1314 Dysphagia, pharyngoesophageal phase: Secondary | ICD-10-CM

## 2013-09-11 DIAGNOSIS — R1319 Other dysphagia: Secondary | ICD-10-CM

## 2013-09-11 NOTE — Patient Instructions (Signed)
1. Upper endoscopy in the near future. Please see separate instructions. 2. Continue Nexium daily. Continue Carafate 4 times daily for now. You may continue hydrocodone liquid for now. We may adjust your medications after your procedure.

## 2013-09-11 NOTE — Progress Notes (Signed)
Primary Care Physician:  Glo Herring., MD Referring MD: Dr. Barnet Glasgow Primary Gastroenterologist:  Garfield Cornea, MD   Chief Complaint  Patient presents with  . Referral    By Oncologist  . Dysphagia    Hurts to swallow    HPI:  Heather Coffey is a 53 y.o. 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 Barnet Glasgow.   She reports the pain in her chest with swallowing really has not improved even since completed radiation two months. Maybe worse. Has been on Dukes Mixture without relief. Has been on carafate for over 9 years for "GERD" per PCP. Also on Nexium daily. Having problems swallowing solids in the beginning but now also having problems with liquids. No problems with saliva. Feels like food is getting stuck. Occasional vomiting with it. Sometimes takes miralax or dulcolax for constipation. No melena, brbpr. Appetite improved. Gained 9 pounds. Lost 30 pounds with treatment.  No prior upper endoscopy.     Current Outpatient Prescriptions  Medication Sig Dispense Refill  . albuterol (PROVENTIL) 4 MG tablet Take 4 mg by mouth 3 (three) times daily as needed for wheezing.      Marland Kitchen ALPRAZolam (XANAX) 0.5 MG tablet Take 0.25-0.5 mg by mouth daily as needed for anxiety (one daily usually).       Marland Kitchen aspirin EC 81 MG tablet Take 81 mg by mouth every other day.       . cyclobenzaprine (FLEXERIL) 10 MG tablet Take 10 mg by mouth daily as needed for muscle spasms.       . Diphenhyd-Hydrocort-Nystatin (FIRST-DUKES MOUTHWASH) SUSP Use as directed 5 mLs in the mouth or throat 4 (four) times daily.  420 mL  2  . esomeprazole (NEXIUM) 40 MG capsule Take 40 mg by mouth daily.      Marland Kitchen FLUoxetine (PROZAC) 20 MG tablet Take 20 mg by mouth daily.      . fluticasone (FLONASE) 50 MCG/ACT nasal spray Place 2 sprays into both nostrils daily as needed for  allergies.       . Fluticasone-Salmeterol (ADVAIR) 250-50 MCG/DOSE AEPB Inhale 1 puff into the lungs 2 (two) times daily.      Marland Kitchen guaifenesin (ROBITUSSIN) 100 MG/5ML syrup Take 200 mg by mouth 3 (three) times daily as needed for cough.      Marland Kitchen HYDROcodone-acetaminophen (HYCET) 7.5-325 mg/15 ml solution 1 tablespoonful of to every 4 hours as needed for pain  240 mL  0  . lidocaine-prilocaine (EMLA) cream Apply a quarter size amount to port site 1 hour prior to chemo. Do not rub in. Cover with plastic wrap.  30 g  3  . loratadine-pseudoephedrine (CLARITIN-D 24-HOUR) 10-240 MG per 24 hr tablet Take 1 tablet by mouth daily.      . metoCLOPramide (REGLAN) 10 MG tablet Take 1 tablet (10 mg total) by mouth 4 (four) times daily.  120 tablet  1  . montelukast (SINGULAIR) 10 MG tablet Take 10 mg by mouth daily as needed (for allergies).       . Multiple Vitamin (MULTIVITAMIN) tablet Take 2 tablets by mouth daily. 2 Multifusion gummies daily      . potassium chloride SA (K-DUR,KLOR-CON) 20 MEQ tablet Take 1 tablet (20 mEq total) by mouth 2 (two) times daily.  60 tablet  5  . pravastatin (PRAVACHOL) 20 MG tablet Take 20 mg by mouth daily.      Marland Kitchen  predniSONE (DELTASONE) 10 MG tablet To 2 tablets daily each morning or as directed.  60 tablet  2  . sucralfate (CARAFATE) 1 G tablet Take 1 g by mouth 4 (four) times daily.      Marland Kitchen triamcinolone cream (KENALOG) 0.1 % Apply 1 application topically 2 (two) times daily as needed (for itching).        No current facility-administered medications for this visit.   Facility-Administered Medications Ordered in Other Visits  Medication Dose Route Frequency Provider Last Rate Last Dose  . sodium chloride 0.9 % injection 10 mL  10 mL Intracatheter PRN Farrel Gobble, MD   10 mL at 07/04/13 1253    Allergies as of 09/11/2013 - Review Complete 09/11/2013  Allergen Reaction Noted  . Augmentin [amoxicillin-pot clavulanate]  04/24/2013  . Codeine  04/16/2013  . Tetracyclines  & related Rash 04/16/2013    Past Medical History  Diagnosis Date  . Arthritis   . Anxiety   . Hyperlipidemia   . Tobacco abuse   . Shortness of breath     Hx: of breath with exertion  . Depression   . GERD (gastroesophageal reflux disease)   . H/O hiatal hernia   . Lung cancer     Past Surgical History  Procedure Laterality Date  . Tubal ligation    . Breast lumpectomy      Hx: of  . Video bronchoscopy with endobronchial ultrasound N/A 05/03/2013    Procedure: VIDEO BRONCHOSCOPY WITH ENDOBRONCHIAL ULTRASOUND;  Surgeon: Melrose Nakayama, MD;  Location: [facility];  Service: Thoracic;  Laterality: N/A;  . Abdominal hysterectomy      still has ovaries per pt  . Nasal septum surgery    . Portacath placement Left 05/21/2013    Procedure: INSERTION PORT-A-CATH;  Surgeon: Melrose Nakayama, MD;  Location: [facility] OR;  Service: Thoracic;  Laterality: Left;    Family History  Problem Relation Age of Onset  . Cancer Sister     uterine  . Diabetes Sister   . COPD Sister   . COPD Mother   . Hypercholesterolemia Mother   . COPD Father   . COPD Brother   . Colon cancer Neg Hx     History   Social History  . Marital Status: Married    Spouse Name: N/A    Number of Children: N/A  . Years of Education: N/A   Occupational History  . Not on file.   Social History Main Topics  . Smoking status: Current Every Day Smoker -- 0.50 packs/day for 35 years    Types: Cigarettes  . Smokeless tobacco: Never Used  . Alcohol Use: No  . Drug Use: No  . Sexual Activity: Not on file   Other Topics Concern  . Not on file   Social History Narrative  . No narrative on file      ROS:  General: Negative for anorexia, no recent weight loss, fever, chills. + fatigue, weakness. Eyes: Negative for vision changes.  ENT: Negative for hoarseness,  nasal congestion. See hpi. CV: Negative for chest pain, angina, palpitations,  peripheral edema. +DOE Respiratory: Negative for dyspnea at rest,   cough, sputum, wheezing. +DOE GI: See history of present illness. GU:  Negative for dysuria, hematuria, urinary incontinence, urinary frequency, nocturnal urination.  MS: Negative for joint pain, low back pain.  Derm: Negative for rash or itching.  Neuro: Negative for weakness, abnormal sensation, seizure, frequent headaches, memory loss, confusion.  Psych: Negative for anxiety, depression, suicidal  ideation, hallucinations.  Endo:no recent wt loss Heme: Negative for bruising or bleeding. Allergy: Negative for rash or hives.    Physical Examination:  BP 103/67  Pulse 91  Temp(Src) 99.2 F (37.3 C) (Oral)  Resp 16  Ht 5\' 6"  (1.676 m)  Wt 118 lb (53.524 kg)  BMI 19.05 kg/m2   General: Well-nourished, well-developed appears older than stated age. In no acute distress. Mild alopecia. Head: Normocephalic, atraumatic.   Eyes: Conjunctiva pink, no icterus. Mouth: Oropharyngeal mucosa moist and pink , no lesions erythema or exudate. Neck: Supple without thyromegaly, masses, or lymphadenopathy.  Lungs: Clear to auscultation bilaterally.  Heart: Regular rate and rhythm, no murmurs rubs or gallops.  Abdomen: Bowel sounds are normal, nontender, nondistended, no hepatosplenomegaly or masses, no abdominal bruits or    hernia , no rebound or guarding.   Rectal: deferred Extremities: No lower extremity edema. No clubbing or deformities.  Neuro: Alert and oriented x 4 , grossly normal neurologically.  Skin: Warm and dry, no rash or jaundice.   Psych: Alert and cooperative, normal mood and affect.  Labs: Lab Results  Component Value Date   WBC 8.8 08/02/2013   HGB 11.9* 08/02/2013   HCT 36.9 08/02/2013   MCV 103.1* 08/02/2013   PLT 362 08/02/2013   Lab Results  Component Value Date   CREATININE 0.85 08/02/2013   BUN 6 08/02/2013   NA 138 08/02/2013   K 3.8 08/02/2013   CL 98 08/02/2013   CO2 29 08/02/2013   Lab Results  Component Value Date   ALT 7 08/02/2013   AST 14 08/02/2013   ALKPHOS  158* 08/02/2013   BILITOT 0.2* 08/02/2013     Imaging Studies: Ct Chest W Contrast  08/16/2013   CLINICAL DATA:  Followup left lung carcinoma. Recently completed radiation therapy.  EXAM: CT CHEST, ABDOMEN, AND PELVIS WITH CONTRAST  TECHNIQUE: Multidetector CT imaging of the chest, abdomen and pelvis was performed following the standard protocol during bolus administration of intravenous contrast.  CONTRAST:  134mL OMNIPAQUE IOHEXOL 300 MG/ML  SOLN  COMPARISON:  PET-CT on 05/01/2013  FINDINGS: CT CHEST FINDINGS  Previously seen central lingular mass which is contiguous with the left hilum has nearly completely resolved, with only a thin walled cavitary lesion remaining at this location. No measurable residual mass lesion is seen. There is increased opacity in the peripheral lingula and left upper lobe, most consistent with radiation pneumonitis.  Previously seen mediastinal lymphadenopathy in the lateral aortic region and AP window shows interval resolution with no residual measurable lymphadenopathy present. No other sites of lymphadenopathy identified within the thorax.  Mild scarring in the right upper lobe is stable. Associated small sub-cm nodular densities in the right upper lobe are also unchanged. No new or enlarging pulmonary nodules or masses are identified. No evidence of pleural or pericardial effusion. No suspicious bone lesions identified.  CT ABDOMEN AND PELVIS FINDINGS  The abdominal parenchymal organs including the adrenal glands are normal in appearance. No soft tissue masses or lymphadenopathy identified within the abdomen or pelvis.  Prior hysterectomy noted. Adnexal regions are unremarkable. No evidence of inflammatory process or abnormal fluid collections. No evidence of bowel obstruction. Tiny gallbladder calcifications again noted, without evidence of cholecystitis or biliary ductal dilatation. No suspicious bone lesions identified.  IMPRESSION: Near complete resolution of central  lingular/left hilar mass, with only thin walled cavitary lesion remaining at this location.  Interval resolution of mediastinal lymphadenopathy.  No new or progressive disease within the thorax. No  evidence of abdominal or pelvic metastatic disease.   Electronically Signed   By: Earle Gell M.D.   On: 08/16/2013 10:39   Ct Abdomen Pelvis W Contrast  08/16/2013   CLINICAL DATA:  Followup left lung carcinoma. Recently completed radiation therapy.  EXAM: CT CHEST, ABDOMEN, AND PELVIS WITH CONTRAST  TECHNIQUE: Multidetector CT imaging of the chest, abdomen and pelvis was performed following the standard protocol during bolus administration of intravenous contrast.  CONTRAST:  12mL OMNIPAQUE IOHEXOL 300 MG/ML  SOLN  COMPARISON:  PET-CT on 05/01/2013  FINDINGS: CT CHEST FINDINGS  Previously seen central lingular mass which is contiguous with the left hilum has nearly completely resolved, with only a thin walled cavitary lesion remaining at this location. No measurable residual mass lesion is seen. There is increased opacity in the peripheral lingula and left upper lobe, most consistent with radiation pneumonitis.  Previously seen mediastinal lymphadenopathy in the lateral aortic region and AP window shows interval resolution with no residual measurable lymphadenopathy present. No other sites of lymphadenopathy identified within the thorax.  Mild scarring in the right upper lobe is stable. Associated small sub-cm nodular densities in the right upper lobe are also unchanged. No new or enlarging pulmonary nodules or masses are identified. No evidence of pleural or pericardial effusion. No suspicious bone lesions identified.  CT ABDOMEN AND PELVIS FINDINGS  The abdominal parenchymal organs including the adrenal glands are normal in appearance. No soft tissue masses or lymphadenopathy identified within the abdomen or pelvis.  Prior hysterectomy noted. Adnexal regions are unremarkable. No evidence of inflammatory process or  abnormal fluid collections. No evidence of bowel obstruction. Tiny gallbladder calcifications again noted, without evidence of cholecystitis or biliary ductal dilatation. No suspicious bone lesions identified.  IMPRESSION: Near complete resolution of central lingular/left hilar mass, with only thin walled cavitary lesion remaining at this location.  Interval resolution of mediastinal lymphadenopathy.  No new or progressive disease within the thorax. No evidence of abdominal or pelvic metastatic disease.   Electronically Signed   By: Earle Gell M.D.   On: 08/16/2013 10:39

## 2013-09-11 NOTE — Assessment & Plan Note (Signed)
53 year old lady with complaints of progressive esophageal dysphagia to solids and likely it's, odynophagia in the setting of recent radiation therapy for lung cancer. She completed therapy in April but continues to have progressive symptoms. No improvement with Dukes mixture. Has been on Nexium and Carafate chronically for years. No prior EGD. Denies typical GERD symptoms currently. 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 Cannot rule out superimposed viral or candidal esophagitis. Recommend EGD+/-ED for further evaluation and management.  I have discussed the risks, alternatives, benefits with regards to but not limited to the risk of reaction to medication, bleeding, infection, perforation and the patient is agreeable to proceed. Written consent to be obtained. Augment conscious sedation with Phenergan 25mg  IV 30 mins before procedure given polypharmacy.  Consider discontinuing carafate at some point in near future. If needed for GERD management, would suggest optimizing PPI approach.  Patient should consider first ever screening colonoscopy some time within the next year when she has fully recovered from her treatments.

## 2013-09-13 ENCOUNTER — Encounter (HOSPITAL_COMMUNITY)
Admission: RE | Disposition: A | Discharge status: Home / Self Care | Payer: Self-pay | Source: Ambulatory Visit | Attending: Internal Medicine

## 2013-09-13 ENCOUNTER — Ambulatory Visit (HOSPITAL_COMMUNITY)
Admission: RE | Admit: 2013-09-13 | Discharge: 2013-09-13 | Disposition: A | Discharge status: Home / Self Care | Payer: Managed Care, Other (non HMO) | Source: Ambulatory Visit | Attending: Internal Medicine | Admitting: Internal Medicine

## 2013-09-13 ENCOUNTER — Encounter (HOSPITAL_COMMUNITY): Payer: Self-pay | Admitting: *Deleted

## 2013-09-13 DIAGNOSIS — R1310 Dysphagia, unspecified: Secondary | ICD-10-CM

## 2013-09-13 DIAGNOSIS — K219 Gastro-esophageal reflux disease without esophagitis: Secondary | ICD-10-CM

## 2013-09-13 DIAGNOSIS — F17200 Nicotine dependence, unspecified, uncomplicated: Secondary | ICD-10-CM | POA: Insufficient documentation

## 2013-09-13 DIAGNOSIS — Z79899 Other long term (current) drug therapy: Secondary | ICD-10-CM | POA: Insufficient documentation

## 2013-09-13 DIAGNOSIS — K222 Esophageal obstruction: Secondary | ICD-10-CM

## 2013-09-13 DIAGNOSIS — Z885 Allergy status to narcotic agent status: Secondary | ICD-10-CM | POA: Insufficient documentation

## 2013-09-13 DIAGNOSIS — Z923 Personal history of irradiation: Secondary | ICD-10-CM | POA: Insufficient documentation

## 2013-09-13 DIAGNOSIS — F329 Major depressive disorder, single episode, unspecified: Secondary | ICD-10-CM | POA: Insufficient documentation

## 2013-09-13 DIAGNOSIS — Z85118 Personal history of other malignant neoplasm of bronchus and lung: Secondary | ICD-10-CM | POA: Insufficient documentation

## 2013-09-13 DIAGNOSIS — F411 Generalized anxiety disorder: Secondary | ICD-10-CM | POA: Insufficient documentation

## 2013-09-13 DIAGNOSIS — K2990 Gastroduodenitis, unspecified, without bleeding: Secondary | ICD-10-CM

## 2013-09-13 DIAGNOSIS — K209 Esophagitis, unspecified without bleeding: Secondary | ICD-10-CM

## 2013-09-13 DIAGNOSIS — K2970 Gastritis, unspecified, without bleeding: Secondary | ICD-10-CM

## 2013-09-13 DIAGNOSIS — E785 Hyperlipidemia, unspecified: Secondary | ICD-10-CM | POA: Insufficient documentation

## 2013-09-13 DIAGNOSIS — Z7982 Long term (current) use of aspirin: Secondary | ICD-10-CM | POA: Insufficient documentation

## 2013-09-13 DIAGNOSIS — Q393 Congenital stenosis and stricture of esophagus: Secondary | ICD-10-CM

## 2013-09-13 DIAGNOSIS — Q391 Atresia of esophagus with tracheo-esophageal fistula: Secondary | ICD-10-CM

## 2013-09-13 DIAGNOSIS — Z880 Allergy status to penicillin: Secondary | ICD-10-CM | POA: Insufficient documentation

## 2013-09-13 DIAGNOSIS — Z881 Allergy status to other antibiotic agents status: Secondary | ICD-10-CM | POA: Insufficient documentation

## 2013-09-13 DIAGNOSIS — K449 Diaphragmatic hernia without obstruction or gangrene: Secondary | ICD-10-CM

## 2013-09-13 DIAGNOSIS — F3289 Other specified depressive episodes: Secondary | ICD-10-CM | POA: Insufficient documentation

## 2013-09-13 HISTORY — PX: SAVORY DILATION: SHX5439

## 2013-09-13 HISTORY — PX: MALONEY DILATION: SHX5535

## 2013-09-13 HISTORY — PX: ESOPHAGOGASTRODUODENOSCOPY: SHX5428

## 2013-09-13 LAB — KOH PREP

## 2013-09-13 SURGERY — EGD (ESOPHAGOGASTRODUODENOSCOPY)
Anesthesia: Moderate Sedation

## 2013-09-13 MED ORDER — LIDOCAINE VISCOUS 2 % MT SOLN
OROMUCOSAL | Status: DC | PRN
  Administered 2013-09-13: 4 mL via OROMUCOSAL

## 2013-09-13 MED ORDER — SODIUM CHLORIDE 0.9 % IV SOLN
INTRAVENOUS | Status: DC
  Administered 2013-09-13: 09:00:00 via INTRAVENOUS

## 2013-09-13 MED ORDER — STERILE WATER FOR IRRIGATION IR SOLN
Status: DC | PRN
  Administered 2013-09-13: 10:00:00

## 2013-09-13 MED ORDER — SODIUM CHLORIDE 0.9 % IJ SOLN
INTRAMUSCULAR | Status: AC
  Filled 2013-09-13: qty 10

## 2013-09-13 MED ORDER — MEPERIDINE HCL 100 MG/ML IJ SOLN
INTRAMUSCULAR | Status: AC
  Filled 2013-09-13: qty 2

## 2013-09-13 MED ORDER — PROMETHAZINE HCL 25 MG/ML IJ SOLN
INTRAMUSCULAR | Status: AC
  Filled 2013-09-13: qty 1

## 2013-09-13 MED ORDER — PROMETHAZINE HCL 25 MG/ML IJ SOLN
25.0000 mg | Freq: Once | INTRAMUSCULAR | Status: AC
  Administered 2013-09-13: 25 mg via INTRAVENOUS

## 2013-09-13 MED ORDER — MIDAZOLAM HCL 5 MG/5ML IJ SOLN
INTRAMUSCULAR | Status: DC | PRN
  Administered 2013-09-13: 2 mg via INTRAVENOUS

## 2013-09-13 MED ORDER — LIDOCAINE VISCOUS 2 % MT SOLN
OROMUCOSAL | Status: DC
Start: 2013-09-13 — End: 2013-09-13
  Filled 2013-09-13: qty 15

## 2013-09-13 MED ORDER — MIDAZOLAM HCL 5 MG/5ML IJ SOLN
INTRAMUSCULAR | Status: AC
  Filled 2013-09-13: qty 10

## 2013-09-13 MED ORDER — ONDANSETRON HCL 4 MG/2ML IJ SOLN
INTRAMUSCULAR | Status: AC
  Filled 2013-09-13: qty 2

## 2013-09-13 MED ORDER — MEPERIDINE HCL 100 MG/ML IJ SOLN
INTRAMUSCULAR | Status: DC | PRN
  Administered 2013-09-13: 25 mg via INTRAVENOUS

## 2013-09-13 NOTE — H&P (View-Only) (Signed)
Primary Care Physician:  Glo Herring., MD Referring MD: Dr. Barnet Glasgow Primary Gastroenterologist:  Garfield Cornea, MD   Chief Complaint  Patient presents with  . Referral    By Oncologist  . Dysphagia    Hurts to swallow    HPI:  Heather Coffey is a 53 y.o. 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 Barnet Glasgow.   She reports the pain in her chest with swallowing really has not improved even since completed radiation two months. Maybe worse. Has been on Dukes Mixture without relief. Has been on carafate for over 9 years for "GERD" per PCP. Also on Nexium daily. Having problems swallowing solids in the beginning but now also having problems with liquids. No problems with saliva. Feels like food is getting stuck. Occasional vomiting with it. Sometimes takes miralax or dulcolax for constipation. No melena, brbpr. Appetite improved. Gained 9 pounds. Lost 30 pounds with treatment.  No prior upper endoscopy.     Current Outpatient Prescriptions  Medication Sig Dispense Refill  . albuterol (PROVENTIL) 4 MG tablet Take 4 mg by mouth 3 (three) times daily as needed for wheezing.      Marland Kitchen ALPRAZolam (XANAX) 0.5 MG tablet Take 0.25-0.5 mg by mouth daily as needed for anxiety (one daily usually).       Marland Kitchen aspirin EC 81 MG tablet Take 81 mg by mouth every other day.       . cyclobenzaprine (FLEXERIL) 10 MG tablet Take 10 mg by mouth daily as needed for muscle spasms.       . Diphenhyd-Hydrocort-Nystatin (FIRST-DUKES MOUTHWASH) SUSP Use as directed 5 mLs in the mouth or throat 4 (four) times daily.  420 mL  2  . esomeprazole (NEXIUM) 40 MG capsule Take 40 mg by mouth daily.      Marland Kitchen FLUoxetine (PROZAC) 20 MG tablet Take 20 mg by mouth daily.      . fluticasone (FLONASE) 50 MCG/ACT nasal spray Place 2 sprays into both nostrils daily as needed for  allergies.       . Fluticasone-Salmeterol (ADVAIR) 250-50 MCG/DOSE AEPB Inhale 1 puff into the lungs 2 (two) times daily.      Marland Kitchen guaifenesin (ROBITUSSIN) 100 MG/5ML syrup Take 200 mg by mouth 3 (three) times daily as needed for cough.      Marland Kitchen HYDROcodone-acetaminophen (HYCET) 7.5-325 mg/15 ml solution 1 tablespoonful of to every 4 hours as needed for pain  240 mL  0  . lidocaine-prilocaine (EMLA) cream Apply a quarter size amount to port site 1 hour prior to chemo. Do not rub in. Cover with plastic wrap.  30 g  3  . loratadine-pseudoephedrine (CLARITIN-D 24-HOUR) 10-240 MG per 24 hr tablet Take 1 tablet by mouth daily.      . metoCLOPramide (REGLAN) 10 MG tablet Take 1 tablet (10 mg total) by mouth 4 (four) times daily.  120 tablet  1  . montelukast (SINGULAIR) 10 MG tablet Take 10 mg by mouth daily as needed (for allergies).       . Multiple Vitamin (MULTIVITAMIN) tablet Take 2 tablets by mouth daily. 2 Multifusion gummies daily      . potassium chloride SA (K-DUR,KLOR-CON) 20 MEQ tablet Take 1 tablet (20 mEq total) by mouth 2 (two) times daily.  60 tablet  5  . pravastatin (PRAVACHOL) 20 MG tablet Take 20 mg by mouth daily.      Marland Kitchen  predniSONE (DELTASONE) 10 MG tablet To 2 tablets daily each morning or as directed.  60 tablet  2  . sucralfate (CARAFATE) 1 G tablet Take 1 g by mouth 4 (four) times daily.      Marland Kitchen triamcinolone cream (KENALOG) 0.1 % Apply 1 application topically 2 (two) times daily as needed (for itching).        No current facility-administered medications for this visit.   Facility-Administered Medications Ordered in Other Visits  Medication Dose Route Frequency Provider Last Rate Last Dose  . sodium chloride 0.9 % injection 10 mL  10 mL Intracatheter PRN Farrel Gobble, MD   10 mL at 07/04/13 1253    Allergies as of 09/11/2013 - Review Complete 09/11/2013  Allergen Reaction Noted  . Augmentin [amoxicillin-pot clavulanate]  04/24/2013  . Codeine  04/16/2013  . Tetracyclines  & related Rash 04/16/2013    Past Medical History  Diagnosis Date  . Arthritis   . Anxiety   . Hyperlipidemia   . Tobacco abuse   . Shortness of breath     Hx: of breath with exertion  . Depression   . GERD (gastroesophageal reflux disease)   . H/O hiatal hernia   . Lung cancer     Past Surgical History  Procedure Laterality Date  . Tubal ligation    . Breast lumpectomy      Hx: of  . Video bronchoscopy with endobronchial ultrasound N/A 05/03/2013    Procedure: VIDEO BRONCHOSCOPY WITH ENDOBRONCHIAL ULTRASOUND;  Surgeon: Melrose Nakayama, MD;  Location: [facility];  Service: Thoracic;  Laterality: N/A;  . Abdominal hysterectomy      still has ovaries per pt  . Nasal septum surgery    . Portacath placement Left 05/21/2013    Procedure: INSERTION PORT-A-CATH;  Surgeon: Melrose Nakayama, MD;  Location: [facility] OR;  Service: Thoracic;  Laterality: Left;    Family History  Problem Relation Age of Onset  . Cancer Sister     uterine  . Diabetes Sister   . COPD Sister   . COPD Mother   . Hypercholesterolemia Mother   . COPD Father   . COPD Brother   . Colon cancer Neg Hx     History   Social History  . Marital Status: Married    Spouse Name: N/A    Number of Children: N/A  . Years of Education: N/A   Occupational History  . Not on file.   Social History Main Topics  . Smoking status: Current Every Day Smoker -- 0.50 packs/day for 35 years    Types: Cigarettes  . Smokeless tobacco: Never Used  . Alcohol Use: No  . Drug Use: No  . Sexual Activity: Not on file   Other Topics Concern  . Not on file   Social History Narrative  . No narrative on file      ROS:  General: Negative for anorexia, no recent weight loss, fever, chills. + fatigue, weakness. Eyes: Negative for vision changes.  ENT: Negative for hoarseness,  nasal congestion. See hpi. CV: Negative for chest pain, angina, palpitations,  peripheral edema. +DOE Respiratory: Negative for dyspnea at rest,   cough, sputum, wheezing. +DOE GI: See history of present illness. GU:  Negative for dysuria, hematuria, urinary incontinence, urinary frequency, nocturnal urination.  MS: Negative for joint pain, low back pain.  Derm: Negative for rash or itching.  Neuro: Negative for weakness, abnormal sensation, seizure, frequent headaches, memory loss, confusion.  Psych: Negative for anxiety, depression, suicidal  ideation, hallucinations.  Endo:no recent wt loss Heme: Negative for bruising or bleeding. Allergy: Negative for rash or hives.    Physical Examination:  BP 103/67  Pulse 91  Temp(Src) 99.2 F (37.3 C) (Oral)  Resp 16  Ht 5\' 6"  (1.676 m)  Wt 118 lb (53.524 kg)  BMI 19.05 kg/m2   General: Well-nourished, well-developed appears older than stated age. In no acute distress. Mild alopecia. Head: Normocephalic, atraumatic.   Eyes: Conjunctiva pink, no icterus. Mouth: Oropharyngeal mucosa moist and pink , no lesions erythema or exudate. Neck: Supple without thyromegaly, masses, or lymphadenopathy.  Lungs: Clear to auscultation bilaterally.  Heart: Regular rate and rhythm, no murmurs rubs or gallops.  Abdomen: Bowel sounds are normal, nontender, nondistended, no hepatosplenomegaly or masses, no abdominal bruits or    hernia , no rebound or guarding.   Rectal: deferred Extremities: No lower extremity edema. No clubbing or deformities.  Neuro: Alert and oriented x 4 , grossly normal neurologically.  Skin: Warm and dry, no rash or jaundice.   Psych: Alert and cooperative, normal mood and affect.  Labs: Lab Results  Component Value Date   WBC 8.8 08/02/2013   HGB 11.9* 08/02/2013   HCT 36.9 08/02/2013   MCV 103.1* 08/02/2013   PLT 362 08/02/2013   Lab Results  Component Value Date   CREATININE 0.85 08/02/2013   BUN 6 08/02/2013   NA 138 08/02/2013   K 3.8 08/02/2013   CL 98 08/02/2013   CO2 29 08/02/2013   Lab Results  Component Value Date   ALT 7 08/02/2013   AST 14 08/02/2013   ALKPHOS  158* 08/02/2013   BILITOT 0.2* 08/02/2013     Imaging Studies: Ct Chest W Contrast  08/16/2013   CLINICAL DATA:  Followup left lung carcinoma. Recently completed radiation therapy.  EXAM: CT CHEST, ABDOMEN, AND PELVIS WITH CONTRAST  TECHNIQUE: Multidetector CT imaging of the chest, abdomen and pelvis was performed following the standard protocol during bolus administration of intravenous contrast.  CONTRAST:  137mL OMNIPAQUE IOHEXOL 300 MG/ML  SOLN  COMPARISON:  PET-CT on 05/01/2013  FINDINGS: CT CHEST FINDINGS  Previously seen central lingular mass which is contiguous with the left hilum has nearly completely resolved, with only a thin walled cavitary lesion remaining at this location. No measurable residual mass lesion is seen. There is increased opacity in the peripheral lingula and left upper lobe, most consistent with radiation pneumonitis.  Previously seen mediastinal lymphadenopathy in the lateral aortic region and AP window shows interval resolution with no residual measurable lymphadenopathy present. No other sites of lymphadenopathy identified within the thorax.  Mild scarring in the right upper lobe is stable. Associated small sub-cm nodular densities in the right upper lobe are also unchanged. No new or enlarging pulmonary nodules or masses are identified. No evidence of pleural or pericardial effusion. No suspicious bone lesions identified.  CT ABDOMEN AND PELVIS FINDINGS  The abdominal parenchymal organs including the adrenal glands are normal in appearance. No soft tissue masses or lymphadenopathy identified within the abdomen or pelvis.  Prior hysterectomy noted. Adnexal regions are unremarkable. No evidence of inflammatory process or abnormal fluid collections. No evidence of bowel obstruction. Tiny gallbladder calcifications again noted, without evidence of cholecystitis or biliary ductal dilatation. No suspicious bone lesions identified.  IMPRESSION: Near complete resolution of central  lingular/left hilar mass, with only thin walled cavitary lesion remaining at this location.  Interval resolution of mediastinal lymphadenopathy.  No new or progressive disease within the thorax. No  evidence of abdominal or pelvic metastatic disease.   Electronically Signed   By: Earle Gell M.D.   On: 08/16/2013 10:39   Ct Abdomen Pelvis W Contrast  08/16/2013   CLINICAL DATA:  Followup left lung carcinoma. Recently completed radiation therapy.  EXAM: CT CHEST, ABDOMEN, AND PELVIS WITH CONTRAST  TECHNIQUE: Multidetector CT imaging of the chest, abdomen and pelvis was performed following the standard protocol during bolus administration of intravenous contrast.  CONTRAST:  170mL OMNIPAQUE IOHEXOL 300 MG/ML  SOLN  COMPARISON:  PET-CT on 05/01/2013  FINDINGS: CT CHEST FINDINGS  Previously seen central lingular mass which is contiguous with the left hilum has nearly completely resolved, with only a thin walled cavitary lesion remaining at this location. No measurable residual mass lesion is seen. There is increased opacity in the peripheral lingula and left upper lobe, most consistent with radiation pneumonitis.  Previously seen mediastinal lymphadenopathy in the lateral aortic region and AP window shows interval resolution with no residual measurable lymphadenopathy present. No other sites of lymphadenopathy identified within the thorax.  Mild scarring in the right upper lobe is stable. Associated small sub-cm nodular densities in the right upper lobe are also unchanged. No new or enlarging pulmonary nodules or masses are identified. No evidence of pleural or pericardial effusion. No suspicious bone lesions identified.  CT ABDOMEN AND PELVIS FINDINGS  The abdominal parenchymal organs including the adrenal glands are normal in appearance. No soft tissue masses or lymphadenopathy identified within the abdomen or pelvis.  Prior hysterectomy noted. Adnexal regions are unremarkable. No evidence of inflammatory process or  abnormal fluid collections. No evidence of bowel obstruction. Tiny gallbladder calcifications again noted, without evidence of cholecystitis or biliary ductal dilatation. No suspicious bone lesions identified.  IMPRESSION: Near complete resolution of central lingular/left hilar mass, with only thin walled cavitary lesion remaining at this location.  Interval resolution of mediastinal lymphadenopathy.  No new or progressive disease within the thorax. No evidence of abdominal or pelvic metastatic disease.   Electronically Signed   By: Earle Gell M.D.   On: 08/16/2013 10:39

## 2013-09-13 NOTE — Interval H&P Note (Signed)
History and Physical Interval Note:  09/13/2013 10:10 AM  Heather Coffey  has presented today for surgery, with the diagnosis of ODYNOPHAGIA, DYSPHAGIA AND GERD  The various methods of treatment have been discussed with the patient and family. After consideration of risks, benefits and other options for treatment, the patient has consented to  Procedure(s) with comments: ESOPHAGOGASTRODUODENOSCOPY (EGD) (N/A) - 2:45-moved to 9:45 [name] to notify pt [place] (N/A) SAVORY DILATION (N/A) as a surgical intervention .  The patient's history has been reviewed, patient examined, no change in status, stable for surgery.  I have reviewed the patient's chart and labs.  Questions were answered to the patient's satisfaction.     Robert Rourk  No change. EGD with potential esophageal dilation as feasible/appropriate. The risks, benefits, limitations, alternatives and imponderables have been reviewed with the patient. Potential for esophageal dilation, biopsy, etc. have also been reviewed.  Questions have been answered. All parties agreeable.

## 2013-09-13 NOTE — Op Note (Signed)
[facility] [address] [place], [address]   ENDOSCOPY PROCEDURE REPORT  PATIENT: Heather, Coffey  MR#: [id number] BIRTHDATE: 1960-12-20 , 52  yrs. old GENDER: Female ENDOSCOPIST: R.  Garfield Cornea, MD FACP [facility] REFERRED BY:  Farrel Gobble, MD Kerin Perna, M.D. PROCEDURE DATE:  09/13/2013 PROCEDURE:   EGD with Venia Minks dilation followed by esophageal and gastric biopsy as well as KOH esophageal brushing  INDICATIONS:   Odynophagia and dysphagia; history of lung cancer with chest radiation  INFORMED CONSENT:   The risks, benefits, limitations, alternatives and imponderables have been discussed.  The potential for biopsy, esophogeal dilation, etc. have also been reviewed.  Questions have been answered.  All parties agreeable.  Please see the history and physical in the medical record for more information.  MEDICATIONS:    Versed 2 mg IV and Demerol 25 mg IV;  Phenergan 25 mg IV. Xylocaine gel orally  DESCRIPTION OF PROCEDURE:   The [id number]-[id number] ([id number])  endoscope was introduced through the mouth and advanced to the second portion of the duodenum without difficulty or limitations.  The mucosal surfaces were surveyed very carefully during advancement of the scope and upon withdrawal.  Retroflexion view of the proximal stomach and esophagogastric junction was performed.      FINDINGS:   Short benign-appearing mid-esophageal stricture. The mucosa of distal half of the tubular esophagus was denuded and inflamed; had somewhat of a ringed appearance; some dark exudate overlying it.. At the GE junction there was a noncritical. Schatzki's ring as well. Unable assess for Barrett's esophagus given the degree of inflammation. No tumor. The esophagus was easily traversed with the diagnostic gastroscope.   Stomach empty. Small hiatal hernia. "J." shape gastric cavity. Diffuse mottling of the gastric mucosa with a "mosaic" appearance. No ulcer or infiltrating process.  Patent pylorus. Normal first and second portion of the duodenum.  THERAPEUTIC / DIAGNOSTIC MANEUVERS PERFORMED:  A 52 French Maloney dilator was passed to full insertion easily. A look back revealed a mid esophageal stricture and a Schatzki's ring or ruptured nicely with superficial tears. No apparent complication. Minimal bleeding. Subsequently, biopsies of the mid and distal esophagus were taken for histologic study. Also the distal esophagus was brushed for KOH prep. Finally, sees the abnormal gastric mucosa taken for histologic study.   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 Schatzki's ring. Distal one half the tubular esophagus severely inflamed. I suspect a component of radiation-induced injury. An element of reflux and pill-induced injury also possible contributing factors. Hiatal hernia. Abnormal gastric mucosa of uncertain significance status post gastric biopsy   RECOMMENDATIONS:   Followup pending studies. Further recommendations to follow.  Swallowing precautions reviewed.    _______________________________ R. Garfield Cornea, MD FACP [facility] eSigned:  R. Garfield Cornea, MD FACP [facility] 09/13/2013 10:41 AM     CC:  PATIENT NAME:  Heather, Coffey MR#: [id number]

## 2013-09-13 NOTE — Discharge Instructions (Addendum)
EGD Discharge instructions Please read the instructions outlined below and refer to this sheet in the next few weeks. These discharge instructions provide you with general information on caring for yourself after you leave the hospital. Your doctor may also give you specific instructions. While your treatment has been planned according to the most current medical practices available, unavoidable complications occasionally occur. If you have any problems or questions after discharge, please call your doctor. ACTIVITY  You may resume your regular activity but move at a slower pace for the next 24 hours.   Take frequent rest periods for the next 24 hours.   Walking will help expel (get rid of) the air and reduce the bloated feeling in your abdomen.   No driving for 24 hours (because of the anesthesia (medicine) used during the test).   You may shower.   Do not sign any important legal documents or operate any machinery for 24 hours (because of the anesthesia used during the test).  NUTRITION  Drink plenty of fluids.   You may resume your normal diet.   Begin with a light meal and progress to your normal diet.   Avoid alcoholic beverages for 24 hours or as instructed by your caregiver.  MEDICATIONS  You may resume your normal medications unless your caregiver tells you otherwise.  WHAT YOU CAN EXPECT TODAY  You may experience abdominal discomfort such as a feeling of fullness or gas pains.  FOLLOW-UP  Your doctor will discuss the results of your test with you.  SEEK IMMEDIATE MEDICAL ATTENTION IF ANY OF THE FOLLOWING OCCUR:  Excessive nausea (feeling sick to your stomach) and/or vomiting.   Severe abdominal pain and distention (swelling).   Trouble swallowing.   Temperature over 101 F (37.8 C).   Rectal bleeding or vomiting of blood.    Swallowing precautions reviewed.   Further recommendations pending review of pathology report  Soft diet   Dysphagia Swallowing  problems (dysphagia) occur when solids and liquids seem to stick in your throat on the way down to your stomach, or the food takes longer to get to the stomach. Other symptoms include regurgitating food, noises coming from the throat, chest discomfort with swallowing, and a feeling of fullness or the feeling of something being stuck in your throat when swallowing. When blockage in your throat is complete it may be associated with drooling. CAUSES  Problems with swallowing may occur because of problems with the muscles. The food cannot be propelled in the usual manner into your stomach. You may have ulcers, scar tissue, or inflammation in the tube down which food travels from your mouth to your stomach (esophagus), which blocks food from passing normally into the stomach. Causes of inflammation include:  Acid reflux from your stomach into your esophagus.  Infection.  Radiation treatment for cancer.  Medicines taken without enough fluids to wash them down into your stomach. You may have nerve problems that prevent signals from being sent to the muscles of your esophagus to contract and move your food down to your stomach. Globus pharyngeus is a relatively common problem in which there is a sense of an obstruction or difficulty in swallowing, without any physical abnormalities of the swallowing passages being found. This problem usually improves over time with reassurance and testing to rule out other causes. DIAGNOSIS Dysphagia can be diagnosed and its cause can be determined by tests in which you swallow a white substance that helps illuminate the inside of your throat (contrast medium) while X-rays  are taken. Sometimes a flexible telescope that is inserted down your throat (endoscopy) to look at your esophagus and stomach is used. TREATMENT   If the dysphagia is caused by acid reflux or infection, medicines may be used.  If the dysphagia is caused by problems with your swallowing muscles,  swallowing therapy may be used to help you strengthen your swallowing muscles.  If the dysphagia is caused by a blockage or mass, procedures to remove the blockage may be done. HOME CARE INSTRUCTIONS  Try to eat soft food that is easier to swallow and check your weight on a daily basis to be sure that it is not decreasing.  Be sure to drink liquids when sitting upright (not lying down). SEEK MEDICAL CARE IF:  You are losing weight because you are unable to swallow.  You are coughing when you drink liquids (aspiration).  You are coughing up partially digested food. SEEK IMMEDIATE MEDICAL CARE IF:  You are unable to swallow your own saliva .  You are having shortness of breath or a fever, or both.  You have a hoarse voice along with difficulty swallowing. MAKE SURE YOU:  Understand these instructions.  Will watch your condition.  Will get help right away if you are not doing well or get worse. Document Released: 03/19/2000 Document Revised: 11/22/2012 Document Reviewed: 09/08/2012 [facility] Patient Information 2014 [place]. Soft Diet The soft diet may be recommended after you were put on a full liquid diet. A normal diet may follow. The soft diet can also be used after surgery if you are too ill to keep down a normal diet. The soft diet may also be needed if you have a hard time chewing foods. DESCRIPTION Tender foods are used. Foods do not need to be ground or pureed. Most raw fruits and vegetables and coarse breads and cereals should be avoided. Fried foods and highly seasoned foods may cause discomfort. NUTRITIONAL ADEQUACY A healthy diet is possible if foods from each of the basic food groups are eaten daily. SOFT DIET FOOD LISTS Milk/Dairy  Allowed: Milk and milk drinks, milk shakes, cream cheese, cottage cheese, mild cheeses.  Avoid: Sharp or highly seasoned cheese. Meat/Meat Substitutes  Allowed: Broiled, roasted, baked, or stewed tender lean beef, mutton,  lamb, veal, chicken, [place], liver, ham, crisp bacon, white fish, tuna, salmon. Eggs, smooth peanut butter.  Avoid: All fried meats, fish, or fowl. Rich gravies and sauces. Lunch meats, sausages, hot dogs. Meats with gristle, chunky peanut butter. Breads/Grains  Allowed: Rice, noodles, spaghetti, macaroni. Dry or cooked refined cereals, such as farina, cream of wheat, oatmeal, grits, whole-wheat cereals. Plain or toasted white or wheat blend or whole-grain breads, soda crackers or saltines, flour tortillas.  Avoid: Wild rice, coarse cereals, such as bran. Seed in or on breads and crackers. Bread or bread products with nuts or seeds. Fruits/Vegetables  Allowed: Fruit and vegetable juices, well-cooked or canned fruits and vegetables, any dried fruit. One citrus fruit daily, 1 vitamin A source daily. Well-ripened, easy to chew fruits, sweet potatoes. Baked, boiled, mashed, creamed, scalloped, or au gratin potatoes. Broths or creamed soups made with allowed vegetables, strained tomatoes.  Avoid: All gas-forming vegetables (corn, radishes, Brussels sprouts, onions, broccoli, cabbage, parsnips, turnips, chili peppers, pinto beans, split peas, dried beans). Fruits containing seeds and skin. Potato chips and corn chips. All others that are not made with allowed vegetables. Highly seasoned soups. Desserts/Sweets  Allowed: Simple desserts, such as custard, junkets, gelatin desserts, plain ice cream and sherbets,  simple cakes and cookies, allowed fruits, sugar, syrup, jelly, honey, plain hard candy, and molasses.  Avoid: Rich pastries, any dessert containing dates, nuts, raisins, or coconut. Fried pastries, such as doughnuts. Chocolate. Beverages  Allowed: Fruit and vegetable juices. Caffeine-free carbonated drinks, coffee, and tea.  Avoid: Caffeinated beverages: coffee, tea, soda or pop. Miscellaneous  Allowed: Butter, cream, margarine, mayonnaise, oil. Cream sauces, salt, and mild spices.  Avoid:  Highly spiced salad dressings. Highly seasoned foods, hot sauce, mustard, horseradish, and pepper. SAMPLE MENU Breakfast  Orange juice.  Oatmeal.  Soft cooked egg.  Toast and margarine.  2% milk.  Coffee. Lunch  Meatloaf.  Mashed potato.  Green beans.  Lemon pudding.  Bread and margarine.  Coffee. Dinner  Consomm or apricot nectar.  Chicken breast.  Rice, peas, and carrots.  Applesauce.  Bread and margarine.  2% milk. To cut the amount of fat in your diet, omit margarine and use 1% or skim milk. NUTRIENT ANALYSIS  Calories........................1953 Kcal.  Protein.........................102 gm.  Carbohydrate...............247 gm.  Fat................................65 gm.  Cholesterol...................449 mg.  Dietary fiber.................19 gm.  Vitamin A.....................2944 RE.  Vitamin C.....................79 mg.  Niacin..........................25 mg.  Riboflavin....................2.0 mg.  Thiamin.......................1.5 mg.  Folate..........................249 mcg.  Calcium.......................1030 mg.  Phosphorus.................1782 mg.  Zinc..............................12 mg.  Iron..............................13 mg.  Sodium.........................299 mg.  Potassium....................3046 mg. Document Released: 06/29/2007 Document Revised: 06/14/2011 Document Reviewed: 06/29/2007 [facility] Patient Information 2014 [place], [place].

## 2013-09-14 ENCOUNTER — Encounter (HOSPITAL_COMMUNITY): Payer: Self-pay | Admitting: Internal Medicine

## 2013-09-14 ENCOUNTER — Ambulatory Visit (HOSPITAL_COMMUNITY): Payer: Managed Care, Other (non HMO)

## 2013-09-15 ENCOUNTER — Encounter: Payer: Self-pay | Admitting: Internal Medicine

## 2013-09-16 ENCOUNTER — Encounter: Payer: Self-pay | Admitting: Internal Medicine

## 2013-09-17 ENCOUNTER — Ambulatory Visit (HOSPITAL_COMMUNITY): Payer: Managed Care, Other (non HMO)

## 2013-09-17 ENCOUNTER — Other Ambulatory Visit (HOSPITAL_COMMUNITY): Payer: Managed Care, Other (non HMO)

## 2013-09-18 NOTE — Progress Notes (Signed)
cc'd to pcp 

## 2013-09-19 ENCOUNTER — Telehealth: Payer: Self-pay

## 2013-09-19 NOTE — Telephone Encounter (Signed)
error 

## 2013-09-24 ENCOUNTER — Telehealth: Payer: Self-pay | Admitting: *Deleted

## 2013-09-24 NOTE — Telephone Encounter (Signed)
Sounds like she's been off reglan which might be her problem more than anything else.  Could be something else going on with chest pain. Might want to be checked by PCP ASAP; get back on reglan

## 2013-09-24 NOTE — Telephone Encounter (Signed)
Pt called stating she is having abd pain and chest pain she is thinking it is from the yeast she had in her throat. Pt has a appt 10/22/13 she wanted to be seen sooner, I told pt this is all we have available right now and we are scheduled out to the end of July. Pt also said she has been a week without her reglan her pharmacy told her it would be there tomorrow. Pt wants to know what can she do about the abd pain and chest pain. Please advise [phone]

## 2013-09-24 NOTE — Telephone Encounter (Signed)
Tried to call pt- call would not go thru.

## 2013-09-25 NOTE — Telephone Encounter (Signed)
Pt's daughter called I made pt aware of what Dr. Gala Romney said, pt's daughter said she will call her PCP to be seen, and she will call pharmacy to see if reglan is there. I made her aware that Almyra Free will be in after lunch today. Per daughter pt's throat still hurts since EGD has been done.

## 2013-09-26 ENCOUNTER — Encounter: Payer: Self-pay | Admitting: Vascular Surgery

## 2013-09-26 ENCOUNTER — Encounter (HOSPITAL_COMMUNITY): Payer: Managed Care, Other (non HMO)

## 2013-09-26 ENCOUNTER — Encounter (HOSPITAL_COMMUNITY): Payer: Managed Care, Other (non HMO) | Attending: Hematology and Oncology

## 2013-09-26 ENCOUNTER — Other Ambulatory Visit (HOSPITAL_COMMUNITY): Payer: Self-pay | Admitting: *Deleted

## 2013-09-26 ENCOUNTER — Encounter (HOSPITAL_COMMUNITY): Payer: Self-pay

## 2013-09-26 VITALS — BP 126/77 | HR 90 | Temp 98.1°F | Resp 18 | Wt 105.0 lb

## 2013-09-26 DIAGNOSIS — C3410 Malignant neoplasm of upper lobe, unspecified bronchus or lung: Secondary | ICD-10-CM

## 2013-09-26 DIAGNOSIS — C771 Secondary and unspecified malignant neoplasm of intrathoracic lymph nodes: Secondary | ICD-10-CM | POA: Insufficient documentation

## 2013-09-26 DIAGNOSIS — B370 Candidal stomatitis: Secondary | ICD-10-CM

## 2013-09-26 DIAGNOSIS — C3402 Malignant neoplasm of left main bronchus: Secondary | ICD-10-CM

## 2013-09-26 DIAGNOSIS — K219 Gastro-esophageal reflux disease without esophagitis: Secondary | ICD-10-CM

## 2013-09-26 DIAGNOSIS — J449 Chronic obstructive pulmonary disease, unspecified: Secondary | ICD-10-CM

## 2013-09-26 DIAGNOSIS — F17200 Nicotine dependence, unspecified, uncomplicated: Secondary | ICD-10-CM

## 2013-09-26 DIAGNOSIS — D72819 Decreased white blood cell count, unspecified: Secondary | ICD-10-CM

## 2013-09-26 DIAGNOSIS — C3400 Malignant neoplasm of unspecified main bronchus: Secondary | ICD-10-CM | POA: Insufficient documentation

## 2013-09-26 DIAGNOSIS — D6959 Other secondary thrombocytopenia: Secondary | ICD-10-CM

## 2013-09-26 LAB — CBC WITH DIFFERENTIAL/PLATELET
BASOS PCT: 0 % (ref 0–1)
Basophils Absolute: 0 10*3/uL (ref 0.0–0.1)
Eosinophils Absolute: 0 10*3/uL (ref 0.0–0.7)
Eosinophils Relative: 1 % (ref 0–5)
HCT: 38.6 % (ref 36.0–46.0)
HEMOGLOBIN: 12.7 g/dL (ref 12.0–15.0)
Lymphocytes Relative: 16 % (ref 12–46)
Lymphs Abs: 0.4 10*3/uL — ABNORMAL LOW (ref 0.7–4.0)
MCH: 35.4 pg — ABNORMAL HIGH (ref 26.0–34.0)
MCHC: 32.9 g/dL (ref 30.0–36.0)
MCV: 107.5 fL — ABNORMAL HIGH (ref 78.0–100.0)
MONOS PCT: 13 % — AB (ref 3–12)
Monocytes Absolute: 0.3 10*3/uL (ref 0.1–1.0)
NEUTROS ABS: 1.7 10*3/uL (ref 1.7–7.7)
NEUTROS PCT: 70 % (ref 43–77)
Platelets: 118 10*3/uL — ABNORMAL LOW (ref 150–400)
RBC: 3.59 MIL/uL — AB (ref 3.87–5.11)
RDW: 14.1 % (ref 11.5–15.5)
WBC: 2.4 10*3/uL — ABNORMAL LOW (ref 4.0–10.5)

## 2013-09-26 LAB — LACTATE DEHYDROGENASE: LDH: 169 U/L (ref 94–250)

## 2013-09-26 LAB — COMPREHENSIVE METABOLIC PANEL
ALBUMIN: 3.3 g/dL — AB (ref 3.5–5.2)
ALK PHOS: 107 U/L (ref 39–117)
ALT: 20 U/L (ref 0–35)
AST: 26 U/L (ref 0–37)
BUN: 9 mg/dL (ref 6–23)
CO2: 29 mEq/L (ref 19–32)
Calcium: 9.8 mg/dL (ref 8.4–10.5)
Chloride: 99 mEq/L (ref 96–112)
Creatinine, Ser: 0.65 mg/dL (ref 0.50–1.10)
GFR calc Af Amer: >90 mL/min (ref >90)
GFR calc non Af Amer: >90 mL/min (ref >90)
GLUCOSE: 86 mg/dL (ref 70–99)
POTASSIUM: 3.1 meq/L — AB (ref 3.7–5.3)
SODIUM: 140 meq/L (ref 137–147)
Total Protein: 7.7 g/dL (ref 6.0–8.3)

## 2013-09-26 MED ORDER — HEPARIN SOD (PORK) LOCK FLUSH 100 UNIT/ML IV SOLN
INTRAVENOUS | Status: AC
  Filled 2013-09-26: qty 5

## 2013-09-26 MED ORDER — FLUCONAZOLE 200 MG PO TABS: ORAL_TABLET | ORAL | Status: DC

## 2013-09-26 MED ORDER — SODIUM CHLORIDE 0.9 % IJ SOLN: 10.0000 mL | INTRAMUSCULAR | Status: DC | PRN

## 2013-09-26 MED ORDER — HEPARIN SOD (PORK) LOCK FLUSH 100 UNIT/ML IV SOLN: 500.0000 [IU] | Freq: Once | INTRAVENOUS | Status: DC

## 2013-09-26 NOTE — Progress Notes (Signed)
Heather Coffey had flipped was not able to access. Called Dr Hendricksons office to schedule appt to have have port looked at tom 09/27/13 at 3:30 pm

## 2013-09-26 NOTE — Progress Notes (Signed)
LABS DRAWN FOR CBCD, CMP, LDH.

## 2013-09-26 NOTE — Patient Instructions (Signed)
[facility] Discharge Instructions  RECOMMENDATIONS MADE BY THE CONSULTANT AND ANY TEST RESULTS WILL BE SENT TO YOUR REFERRING PHYSICIAN.  EXAM FINDINGS BY THE PHYSICIAN TODAY AND SIGNS OR SYMPTOMS TO REPORT TO CLINIC OR PRIMARY PHYSICIAN: You seen Dr Barnet Glasgow today  Diflucan 200 mg daily until oral thrush is controlled.  Continue Advair, Flonase, and prednisone.  Repeat CTs of the chest abdomen and pelvis on 10/30/2013 with repeat visit on 11/02/2013 with CBC, chem profile, and LDH that day.   Thank you for choosing [facility] to provide your oncology and hematology care.  To afford each patient quality time with our providers, please arrive at least 15 minutes before your scheduled appointment time.  With your help, our goal is to use those 15 minutes to complete the necessary work-up to ensure our physicians have the information they need to help with your evaluation and healthcare recommendations.    Effective January 1st, 2014, we ask that you re-schedule your appointment with our physicians should you arrive 10 or more minutes late for your appointment.  We strive to give you quality time with our providers, and arriving late affects you and other patients whose appointments are after yours.    Again, thank you for choosing [facility].  Our hope is that these requests will decrease the amount of time that you wait before being seen by our physicians.       _____________________________________________________________  Should you have questions after your visit to [facility], please contact our office at [phone] between the hours of 8:30 a.m. and 5:00 p.m.  Voicemails left after 4:30 p.m. will not be returned until the following business day.  For prescription refill requests, have your pharmacy contact our office with your prescription refill request.

## 2013-09-26 NOTE — Progress Notes (Signed)
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 Lung Cancer    CURRENT THERAPY: Weekly carboplatin/Taxol with daily radiotherapy for stage III squamous cell carcinoma lung, last chemotherapy on 07/04/2013 with radiotherapy completed on 07/18/2013.  INTERVAL HISTORY: Heather Coffey 53 y.o. 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 She was seen by gastroenterology with EGD revealing evidence of candidiasis. She was placed on Magic mouthwash without much success. She also had esophageal dilatation due to stricture. She subsequently developed sinusitis with otitis and was treated with a Z-Pak plus meclizine and Flonase inhaler. Sinus tenderness has improved. She is still having difficulty in swallowing. She's had chronic cough with difficulty raising sputum despite taking prednisone 20 mg daily. She denies any diarrhea, abdominal distention, but has had some discomfort in the anterior chest from coughing. She denies any severe headache now but did have headache associated with her sinus infection.  MEDICAL HISTORY: Past Medical History  Diagnosis Date  . Arthritis   . Anxiety   . Hyperlipidemia   . Tobacco abuse   . Shortness of breath     Hx: of breath with exertion  . Depression   . GERD  (gastroesophageal reflux disease)   . H/O hiatal hernia   . 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   Lung cancer, lingula  04/29/2013  Initial Diagnosis  Squamous cell carcinoma  05/30/2013 -  Chemotherapy  Carboplatin/Paclitaxel weekly in combination with radiation therapy  Radiation Therapy   ALLERGIES:  is allergic to augmentin; codeine; and tetracyclines & related.  MEDICATIONS: has a current medication list which includes the following prescription(s): albuterol, alprazolam, aspirin ec, cyclobenzaprine, first-dukes mouthwash, esomeprazole, fluconazole, fluoxetine, fluticasone, fluticasone-salmeterol, guaifenesin, hydrocodone-acetaminophen, lidocaine-prilocaine, loratadine-pseudoephedrine, metoclopramide, montelukast, multivitamin, potassium chloride sa, pravastatin, prednisone, sucralfate, and triamcinolone cream, and the following Facility-Administered Medications: heparin lock flush, sodium chloride, and sodium chloride.  SURGICAL HISTORY:  Past Surgical History  Procedure Laterality Date  . Tubal ligation    . Breast lumpectomy      Hx: of  . Video bronchoscopy with endobronchial ultrasound N/A 05/03/2013    Procedure: VIDEO BRONCHOSCOPY WITH ENDOBRONCHIAL ULTRASOUND;  Surgeon: Melrose Nakayama, MD;  Location: [facility];  Service: Thoracic;  Laterality: N/A;  . Abdominal hysterectomy      still has ovaries per pt  . Nasal septum surgery    . Portacath placement Left 05/21/2013    Procedure: INSERTION PORT-A-CATH;  Surgeon: Melrose Nakayama, MD;  Location: [facility];  Service: Thoracic;  Laterality: Left;  . Esophagogastroduodenoscopy N/A 09/13/2013    Procedure: ESOPHAGOGASTRODUODENOSCOPY (EGD);  Surgeon: Daneil Dolin, MD;  [facility];  Service: Endoscopy;  Laterality: N/A;  2:45-moved to  9:45 [name] to notify pt  . Maloney dilation N/A 09/13/2013    Procedure: Venia Minks DILATION;  Surgeon: Daneil Dolin, MD;  [facility];  Service: Endoscopy;  Laterality: N/A;  . Savory dilation N/A 09/13/2013    Procedure: SAVORY DILATION;  Surgeon: Daneil Dolin, MD;  [facility];  Service: Endoscopy;  Laterality: N/A;    FAMILY HISTORY: family history includes COPD in her brother, father, mother, and sister; Cancer in her sister; Diabetes in her sister; Hypercholesterolemia in her mother. There is no history of Colon cancer.  SOCIAL HISTORY:  reports that she has been smoking Cigarettes.  She has a 17.5 pack-year smoking history. She has never used smokeless tobacco. She reports that she does not drink alcohol or use illicit drugs.  REVIEW OF SYSTEMS:  Other than that discussed above is noncontributory.  PHYSICAL EXAMINATION: ECOG PERFORMANCE STATUS: 1 - Symptomatic but completely ambulatory  Blood pressure 126/77, pulse 90, temperature 98.1 F (36.7 C), temperature source Oral, resp. rate 18, weight 105 lb (47.628 kg).  GENERAL:alert, no distress and comfortable SKIN: skin color, texture, turgor are normal, no rashes or significant lesions EYES: PERLA; Conjunctiva are pink and non-injected, sclera clear EARS: No evidence of otitis or fluid behind the tympanic membrane. SINUSES: No redness or tenderness over maxillary or ethmoid sinuses OROPHARYNX:no exudate, no erythema on lips, buccal mucosa. Tongue with white plaques.Marland Kitchen NECK: supple, thyroid normal size, non-tender, without nodularity. No masses CHEST: Increased AP diameter with light port in place and left anterior chest. LYMPH:  no palpable lymphadenopathy in the cervical, axillary or inguinal LUNGS: clear to auscultation and percussion with normal breathing effort. Scattered expiratory rhonchi bilaterally HEART: regular rate & rhythm and  no murmurs. ABDOMEN:abdomen soft, non-tender and normal bowel sounds MUSCULOSKELETAL:no cyanosis of digits and no clubbing. Range of motion normal.  NEURO: alert & oriented x 3 with fluent speech, no focal motor/sensory deficits   LABORATORY DATA: Appointment on 09/26/2013  Component Date Value Ref Range Status  . WBC 09/26/2013 2.4* 4.0 - 10.5 K/uL Final  . RBC 09/26/2013 3.59* 3.87 - 5.11 MIL/uL Final  . Hemoglobin 09/26/2013 12.7  12.0 - 15.0 g/dL Final  . HCT 09/26/2013 38.6  36.0 - 46.0 % Final  . MCV 09/26/2013 107.5* 78.0 - 100.0 fL Final  . MCH 09/26/2013 35.4* 26.0 - 34.0 pg Final  . MCHC 09/26/2013 32.9  30.0 - 36.0 g/dL Final  . RDW 09/26/2013 14.1  11.5 - 15.5 % Final  . Platelets 09/26/2013 118* 150 - 400 K/uL Final   Comment: SPECIMEN CHECKED FOR CLOTS                          PLATELET COUNT CONFIRMED BY SMEAR  . Neutrophils Relative % 09/26/2013 70  43 - 77 % Final  . Neutro Abs 09/26/2013 1.7  1.7 - 7.7 K/uL Final  . Lymphocytes Relative 09/26/2013 16  12 - 46 % Final  . Lymphs Abs 09/26/2013 0.4* 0.7 - 4.0 K/uL Final  . Monocytes Relative 09/26/2013 13* 3 - 12 % Final  . Monocytes Absolute 09/26/2013 0.3  0.1 - 1.0 K/uL Final  . Eosinophils Relative 09/26/2013 1  0 - 5 % Final  . Eosinophils Absolute 09/26/2013 0.0  0.0 - 0.7 K/uL Final  . Basophils Relative 09/26/2013 0  0 - 1 % Final  . Basophils Absolute 09/26/2013 0.0  0.0 - 0.1 K/uL Final  . Sodium 09/26/2013 140  137 - 147 mEq/L Final  . Potassium 09/26/2013 3.1* 3.7 - 5.3 mEq/L Final  . Chloride 09/26/2013 99  96 - 112 mEq/L Final  . CO2 09/26/2013 29  19 - 32 mEq/L Final  . Glucose, Bld 09/26/2013 86  70 - 99 mg/dL Final  . BUN 09/26/2013 9  6 - 23 mg/dL Final  . Creatinine, Ser 09/26/2013 0.65  0.50 - 1.10 mg/dL Final  . Calcium 09/26/2013 9.8  8.4 - 10.5 mg/dL Final  . Total Protein 09/26/2013 7.7  6.0 - 8.3 g/dL Final  . Albumin 09/26/2013 3.3* 3.5 - 5.2 g/dL Final  . AST 09/26/2013 26  0 - 37 U/L  Final  . ALT 09/26/2013 20  0 - 35 U/L Final  . Alkaline Phosphatase 09/26/2013 107  39 - 117 U/L Final  . Total Bilirubin 09/26/2013 <0.2* 0.3 - 1.2 mg/dL Final  . GFR calc non Af Amer 09/26/2013 >90  >90 mL/min Final  . GFR calc Af Amer 09/26/2013 >90  >90 mL/min Final   Comment: (NOTE)                          The eGFR has been calculated using the CKD EPI equation.                          This calculation has not been validated in all clinical situations.                          eGFR's persistently <90 mL/min signify possible Chronic Kidney                          Disease.  Marland Kitchen LDH 09/26/2013 169  94 - 250 U/L Final  Admission on 09/13/2013, Discharged on 09/13/2013  Component Date Value Ref Range Status  . Specimen Description 09/13/2013 ESOPHAGUS   Final  . Special Requests 09/13/2013 NONE   Final  . KOH Prep 09/13/2013 RARE YEAST   Final  . Report Status 09/13/2013 09/13/2013 FINAL   Final    PATHOLOGY: Squamous cell carcinoma  Urinalysis No results found for this basename: colorurine,  appearanceur,  labspec,  phurine,  glucoseu,  hgbur,  bilirubinur,  ketonesur,  proteinur,  urobilinogen,  nitrite,  leukocytesur    RADIOGRAPHIC STUDIES: CT Abdomen Pelvis W Contrast Status: Final result         PACS Images    Show images for CT Abdomen Pelvis W Contrast         Study Result    CLINICAL DATA: Followup left lung carcinoma. Recently completed  radiation therapy.  EXAM:  CT CHEST, ABDOMEN, AND PELVIS WITH CONTRAST  TECHNIQUE:  Multidetector CT imaging of the chest, abdomen and pelvis was  performed following the standard protocol during bolus  administration of intravenous contrast.  CONTRAST: 131mL OMNIPAQUE IOHEXOL 300 MG/ML SOLN  COMPARISON: PET-CT on 05/01/2013  FINDINGS:  CT CHEST FINDINGS  Previously seen central lingular mass which is contiguous with the  left hilum has nearly completely resolved, with only a thin walled  cavitary lesion remaining  at this location. No measurable residual  mass lesion is seen. There is increased opacity in the peripheral  lingula and left upper lobe, most consistent with radiation  pneumonitis.  Previously seen mediastinal lymphadenopathy in the lateral aortic  region and AP window shows interval resolution with no  residual  measurable lymphadenopathy present. No other sites of  lymphadenopathy identified within the thorax.  Mild scarring in the right upper lobe is stable. Associated small  sub-cm nodular densities in the right upper lobe are also unchanged.  No new or enlarging pulmonary nodules or masses are identified. No  evidence of pleural or pericardial effusion. No suspicious bone  lesions identified.  CT ABDOMEN AND PELVIS FINDINGS  The abdominal parenchymal organs including the adrenal glands are  normal in appearance. No soft tissue masses or lymphadenopathy  identified within the abdomen or pelvis.  Prior hysterectomy noted. Adnexal regions are unremarkable. No  evidence of inflammatory process or abnormal fluid collections. No  evidence of bowel obstruction. Tiny gallbladder calcifications again  noted, without evidence of cholecystitis or biliary ductal  dilatation. No suspicious bone lesions identified.  IMPRESSION:  Near complete resolution of central lingular/left hilar mass, with  only thin walled cavitary lesion remaining at this location.  Interval resolution of mediastinal lymphadenopathy.  No new or progressive disease within the thorax. No evidence of  abdominal or pelvic metastatic disease.  Electronically Signed  By: Earle Gell M.D.  On: 08/16/2013 10     ASSESSMENT:  #1.. 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 2.  Chronic obstructive pulmonary disease, unfortunately still smoking.  3. History of right lung abscess, status post medical treatment only occurring about 3 or 4 years ago requiring hospitalization and intravenous antibiotics with no antecedent history of aspiration or loss of consciousness.  4. Gastroesophageal reflux disease, controlled, status post EGD with dilatation of the stricture. 5. 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 6. Leukopenia and thrombocytopenia from previous chemotherapy.    PLAN:  #1. Diflucan 200 mg daily until oral thrush is controlled. #2. Continue Advair, Flonase, and prednisone. #3. Repeat CTs of the chest abdomen and pelvis on 10/31/2013 with repeat visit on 11/02/2013 with CBC, chem profile, and LDH that day.   All questions were answered. The patient knows to call the clinic with any problems, questions or concerns. We can certainly see the patient much sooner if necessary.   I spent 25 minutes counseling the patient face to face. The total time spent in the appointment was 30 minutes.    Doroteo Bradford, MD 09/26/2013 10:46 AM  DISCLAIMER:  This note was dictated with voice recognition software.  Similar sounding words can inadvertently be transcribed inaccurately and may not be corrected upon review.

## 2013-09-27 ENCOUNTER — Ambulatory Visit: Payer: Managed Care, Other (non HMO) | Admitting: Vascular Surgery

## 2013-09-28 ENCOUNTER — Ambulatory Visit (HOSPITAL_COMMUNITY)
Admission: RE | Admit: 2013-09-28 | Discharge: 2013-09-28 | Disposition: A | Discharge status: Home / Self Care | Payer: Managed Care, Other (non HMO) | Source: Ambulatory Visit | Attending: Oncology | Admitting: Oncology

## 2013-09-28 DIAGNOSIS — C3410 Malignant neoplasm of upper lobe, unspecified bronchus or lung: Secondary | ICD-10-CM

## 2013-09-28 DIAGNOSIS — Z85118 Personal history of other malignant neoplasm of bronchus and lung: Secondary | ICD-10-CM | POA: Insufficient documentation

## 2013-10-02 ENCOUNTER — Other Ambulatory Visit: Payer: Self-pay | Admitting: *Deleted

## 2013-10-02 ENCOUNTER — Encounter: Payer: Self-pay | Admitting: Thoracic Surgery (Cardiothoracic Vascular Surgery)

## 2013-10-02 ENCOUNTER — Ambulatory Visit (INDEPENDENT_AMBULATORY_CARE_PROVIDER_SITE_OTHER): Payer: Managed Care, Other (non HMO) | Admitting: Thoracic Surgery (Cardiothoracic Vascular Surgery)

## 2013-10-02 VITALS — BP 128/76 | HR 88 | Resp 20 | Ht 66.0 in | Wt 105.0 lb

## 2013-10-02 DIAGNOSIS — C3402 Malignant neoplasm of left main bronchus: Secondary | ICD-10-CM

## 2013-10-02 DIAGNOSIS — C3410 Malignant neoplasm of upper lobe, unspecified bronchus or lung: Secondary | ICD-10-CM

## 2013-10-02 NOTE — Progress Notes (Signed)
HPI: Heather Coffey returns today because of a malfunction of her Port-A-Cath.  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 She had a portacath placed in February for chemotherapy. She is now status post 6 cycles of carboplatin/Taxol weekly with concurrent radiotherapy with radiation therapy completed on 07/18/2013 and chemotherapy completed on 07/04/2013. She had an excellent clinical response on repeat CT scan.  Last week when she went in for lab work the check noted that her Port-A-Cath had flipped over and was unable to access it. She had not had any previous issues with a Port-A-Cath and it was functioning well and there was no difficulty accessing it or deaccessing it on her prior visit.  Past Medical History  Diagnosis Date  . Arthritis   . Anxiety   . Hyperlipidemia   . Tobacco abuse   . Shortness of breath     Hx: of breath with exertion  . Depression   . GERD (gastroesophageal reflux disease)   . H/O hiatal hernia   . Lung cancer    Past Surgical History  Procedure Laterality Date  . Tubal ligation    . Breast lumpectomy      Hx: of  . Video bronchoscopy with endobronchial ultrasound N/A 05/03/2013    Procedure: VIDEO BRONCHOSCOPY WITH ENDOBRONCHIAL ULTRASOUND;  Surgeon: Melrose Nakayama, MD;  Location: [facility];  Service: Thoracic;  Laterality: N/A;  . Abdominal hysterectomy      still has ovaries per pt  . Nasal septum surgery    . Portacath placement Left 05/21/2013    Procedure: INSERTION PORT-A-CATH;  Surgeon: Melrose Nakayama, MD;  Location: [facility];  Service: Thoracic;  Laterality: Left;  . Esophagogastroduodenoscopy N/A 09/13/2013    Procedure: ESOPHAGOGASTRODUODENOSCOPY (EGD);  Surgeon: Daneil Dolin, MD;  [facility];  Service: Endoscopy;  Laterality: N/A;  2:45-moved to 9:45 [name] to notify pt  . Maloney dilation N/A 09/13/2013    Procedure: Venia Minks DILATION;  Surgeon: Daneil Dolin, MD;  [facility];  Service: Endoscopy;  Laterality: N/A;  . Savory dilation N/A 09/13/2013    Procedure: SAVORY DILATION;  Surgeon: Daneil Dolin, MD;  [facility];  Service: Endoscopy;  Laterality: N/A;      Current Outpatient Prescriptions  Medication Sig Dispense Refill  . ALPRAZolam (XANAX) 0.5 MG tablet Take 0.25-0.5 mg by mouth daily as needed for anxiety (one daily usually).       Marland Kitchen aspirin EC 81 MG tablet Take 81 mg by mouth every other day.       . Diphenhyd-Hydrocort-Nystatin (FIRST-DUKES MOUTHWASH) SUSP Use as directed 5 mLs in the mouth or throat 4 (four) times daily.  420 mL  2  . esomeprazole (NEXIUM) 40 MG capsule Take 40 mg by mouth daily.      . fluconazole (DIFLUCAN) 200 MG tablet Take one tablet daily for thrush.  20 tablet  3  . FLUoxetine (PROZAC) 20 MG tablet Take 20 mg by mouth daily.      . fluticasone (FLONASE) 50 MCG/ACT nasal spray Place 2 sprays into both nostrils daily as needed for allergies.       . Fluticasone-Salmeterol (ADVAIR) 250-50 MCG/DOSE AEPB Inhale 1 puff into the lungs 2 (two) times daily.      Marland Kitchen guaifenesin (ROBITUSSIN) 100 MG/5ML syrup Take 200 mg by mouth 3 (three) times daily as needed for cough.      Marland Kitchen HYDROcodone-acetaminophen (HYCET)  7.5-325 mg/15 ml solution 1 tablespoonful of to every 4 hours as needed for pain  240 mL  0  . lidocaine-prilocaine (EMLA) cream Apply a quarter size amount to port site 1 hour prior to chemo. Do not rub in. Cover with plastic wrap.  30 g  3  . loratadine-pseudoephedrine (CLARITIN-D 24-HOUR) 10-240 MG per 24 hr tablet Take 1 tablet by mouth daily.      . metoCLOPramide (REGLAN) 10 MG tablet Take 1 tablet (10 mg total) by mouth 4 (four) times daily.  120 tablet  1  . montelukast (SINGULAIR) 10 MG tablet Take 10 mg by mouth daily as needed (for allergies).       . Multiple Vitamin (MULTIVITAMIN) tablet Take 2 tablets by mouth daily. 2 Multifusion gummies daily      . pravastatin (PRAVACHOL) 20 MG tablet Take 20 mg  by mouth daily.      . predniSONE (DELTASONE) 10 MG tablet To 2 tablets daily each morning or as directed.  60 tablet  2  . sucralfate (CARAFATE) 1 G tablet Take 1 g by mouth 4 (four) times daily.      Marland Kitchen triamcinolone cream (KENALOG) 0.1 % Apply 1 application topically 2 (two) times daily as needed (for itching).        No current facility-administered medications for this visit.   Facility-Administered Medications Ordered in Other Visits  Medication Dose Route Frequency Provider Last Rate Last Dose  . sodium chloride 0.9 % injection 10 mL  10 mL Intracatheter PRN Farrel Gobble, MD   10 mL at 07/04/13 1253    Physical Exam BP 128/76  Pulse 88  Resp 20  Ht 5\' 6"  (1.676 m)  Wt 105 lb (47.628 kg)  BMI 16.96 kg/m2  SpO9 65% 53 year old woman who appears older than her stated age Alert and oriented x3 with no focal deficits No cervical adenopathy or JVD Cardiac regular rate and rhythm normal S1 and S2 Lungs clear with equal breath sounds bilaterally Left upper chest incision well-healed, port has flipped Abdomen benign Extremities without clubbing cyanosis or edema  Diagnostic Tests: Chest x-ray CHEST - 1 VIEW  COMPARISON: 05/21/2013 and chest CT 08/16/2013  FINDINGS:  Left subclavian Port-A-Cath is present with tip overlying the region  of the SVC. The hub of the Port-A-Cath does appear to be flipped  upside down.  Lungs are adequately inflated with worsening opacification in the  left perihilar/midlung region with ill definition of the left heart  border. No evidence of effusion or pneumothorax. Remainder the exam  is unchanged.  IMPRESSION:  Worsening opacification in the left perihilar/ midlung region with  ill-defined left heart border. Findings may be due to recurrent  infectious process.  Left subclavian Port-A-Cath with tip in adequate position over the  region of the SVC. Port-A-Cath hub appears to be flipped upside down  supporting the clinical suspicion.   Electronically Signed  By: Marin Olp M.D.  On: 09/28/2013 09:01  Impression: 53 year old woman with stage IIIB lung cancer who has a Port-A-Cath in place. She has completed chemotherapy but is still having frequent blood draws, so she still needs the port for access. It clearly has turned over and is upside down in the pocket. Rather surprising that this happened so late after initial placement. She says that it was functioning normally and there was no difficulty on the prior use.  I discussed with her the the planned procedure which will be to reopen the incision and reposition the port and  secure it with additional sutures. We discussed the indications, risks, benefits, and alternatives. We'll plan to do this on an outpatient procedure. We should be able to do this with local and sedation. I did caution her that if the port is not functioning we would need to go ahead and place a new one at that time. However, I suspect that once it is repositioned which looked fine. She understands the risk include those related to sedation and local anesthesia, infection, which could result in the need to remove the port, as well as other unforeseeable complications. Should the port need to be replaced complications such as bleeding and pneumothorax would also be possible.  Plan: Port-A-Cath revision on Thursday, July 6

## 2013-10-05 ENCOUNTER — Encounter (HOSPITAL_COMMUNITY): Payer: Self-pay | Admitting: Pharmacy Technician

## 2013-10-10 ENCOUNTER — Encounter (HOSPITAL_COMMUNITY): Payer: Self-pay | Admitting: *Deleted

## 2013-10-10 MED ORDER — MUPIROCIN 2 % EX OINT
TOPICAL_OINTMENT | Freq: Two times a day (BID) | CUTANEOUS | Status: DC
  Filled 2013-10-10 (×2): qty 22

## 2013-10-10 MED ORDER — VANCOMYCIN HCL IN DEXTROSE 1-5 GM/200ML-% IV SOLN
1000.0000 mg | INTRAVENOUS | Status: AC
  Administered 2013-10-11: 1000 mg via INTRAVENOUS
  Filled 2013-10-10: qty 200

## 2013-10-11 ENCOUNTER — Encounter (HOSPITAL_COMMUNITY)
Admission: RE | Disposition: A | Discharge status: Home / Self Care | Payer: Self-pay | Source: Ambulatory Visit | Attending: Thoracic Surgery (Cardiothoracic Vascular Surgery)

## 2013-10-11 ENCOUNTER — Encounter (HOSPITAL_COMMUNITY): Payer: Self-pay | Admitting: *Deleted

## 2013-10-11 ENCOUNTER — Encounter (HOSPITAL_COMMUNITY): Payer: Managed Care, Other (non HMO) | Admitting: Anesthesiology

## 2013-10-11 ENCOUNTER — Ambulatory Visit (HOSPITAL_COMMUNITY): Payer: Managed Care, Other (non HMO) | Admitting: Anesthesiology

## 2013-10-11 ENCOUNTER — Ambulatory Visit (HOSPITAL_COMMUNITY): Payer: Managed Care, Other (non HMO)

## 2013-10-11 ENCOUNTER — Ambulatory Visit (HOSPITAL_COMMUNITY)
Admission: RE | Admit: 2013-10-11 | Discharge: 2013-10-11 | Disposition: A | Discharge status: Home / Self Care | Payer: Managed Care, Other (non HMO) | Source: Ambulatory Visit | Attending: Thoracic Surgery (Cardiothoracic Vascular Surgery) | Admitting: Thoracic Surgery (Cardiothoracic Vascular Surgery)

## 2013-10-11 DIAGNOSIS — T82598A Other mechanical complication of other cardiac and vascular devices and implants, initial encounter: Secondary | ICD-10-CM

## 2013-10-11 DIAGNOSIS — E785 Hyperlipidemia, unspecified: Secondary | ICD-10-CM | POA: Insufficient documentation

## 2013-10-11 DIAGNOSIS — Y831 Surgical operation with implant of artificial internal device as the cause of abnormal reaction of the patient, or of later complication, without mention of misadventure at the time of the procedure: Secondary | ICD-10-CM | POA: Insufficient documentation

## 2013-10-11 DIAGNOSIS — J4489 Other specified chronic obstructive pulmonary disease: Secondary | ICD-10-CM | POA: Insufficient documentation

## 2013-10-11 DIAGNOSIS — C3490 Malignant neoplasm of unspecified part of unspecified bronchus or lung: Secondary | ICD-10-CM | POA: Insufficient documentation

## 2013-10-11 DIAGNOSIS — F17200 Nicotine dependence, unspecified, uncomplicated: Secondary | ICD-10-CM | POA: Insufficient documentation

## 2013-10-11 DIAGNOSIS — F411 Generalized anxiety disorder: Secondary | ICD-10-CM | POA: Insufficient documentation

## 2013-10-11 DIAGNOSIS — K219 Gastro-esophageal reflux disease without esophagitis: Secondary | ICD-10-CM | POA: Insufficient documentation

## 2013-10-11 DIAGNOSIS — K449 Diaphragmatic hernia without obstruction or gangrene: Secondary | ICD-10-CM | POA: Insufficient documentation

## 2013-10-11 DIAGNOSIS — J449 Chronic obstructive pulmonary disease, unspecified: Secondary | ICD-10-CM | POA: Insufficient documentation

## 2013-10-11 DIAGNOSIS — C3402 Malignant neoplasm of left main bronchus: Secondary | ICD-10-CM

## 2013-10-11 DIAGNOSIS — F3289 Other specified depressive episodes: Secondary | ICD-10-CM | POA: Insufficient documentation

## 2013-10-11 DIAGNOSIS — F329 Major depressive disorder, single episode, unspecified: Secondary | ICD-10-CM | POA: Insufficient documentation

## 2013-10-11 HISTORY — PX: PORT A CATH REVISION: SHX6033

## 2013-10-11 LAB — CBC
HCT: 30.1 % — ABNORMAL LOW (ref 36.0–46.0)
Hemoglobin: 9.3 g/dL — ABNORMAL LOW (ref 12.0–15.0)
MCH: 33.9 pg (ref 26.0–34.0)
MCHC: 30.9 g/dL (ref 30.0–36.0)
MCV: 109.9 fL — ABNORMAL HIGH (ref 78.0–100.0)
PLATELETS: 208 10*3/uL (ref 150–400)
RBC: 2.74 MIL/uL — ABNORMAL LOW (ref 3.87–5.11)
RDW: 16.5 % — AB (ref 11.5–15.5)
WBC: 6.4 10*3/uL (ref 4.0–10.5)

## 2013-10-11 LAB — APTT: aPTT: 28 seconds (ref 24–37)

## 2013-10-11 LAB — COMPREHENSIVE METABOLIC PANEL
ALBUMIN: 2.8 g/dL — AB (ref 3.5–5.2)
ALT: 20 U/L (ref 0–35)
AST: 12 U/L (ref 0–37)
Alkaline Phosphatase: 85 U/L (ref 39–117)
Anion gap: 14 (ref 5–15)
BUN: 12 mg/dL (ref 6–23)
CALCIUM: 8.4 mg/dL (ref 8.4–10.5)
CHLORIDE: 105 meq/L (ref 96–112)
CO2: 26 meq/L (ref 19–32)
CREATININE: 0.63 mg/dL (ref 0.50–1.10)
GFR calc Af Amer: >90 mL/min (ref >90)
Glucose, Bld: 79 mg/dL (ref 70–99)
Potassium: 3.7 mEq/L (ref 3.7–5.3)
SODIUM: 145 meq/L (ref 137–147)
Total Bilirubin: <0.2 mg/dL — ABNORMAL LOW (ref 0.3–1.2)
Total Protein: 5.8 g/dL — ABNORMAL LOW (ref 6.0–8.3)

## 2013-10-11 LAB — PROTIME-INR
INR: 0.97 (ref 0.00–1.49)
Prothrombin Time: 12.9 seconds (ref 11.6–15.2)

## 2013-10-11 LAB — SURGICAL PCR SCREEN
MRSA, PCR: POSITIVE — AB
Staphylococcus aureus: POSITIVE — AB

## 2013-10-11 SURGERY — REVISION, PORT-A-CATH INSERTION
Anesthesia: Monitor Anesthesia Care | Site: Chest | Laterality: Left

## 2013-10-11 MED ORDER — PROMETHAZINE HCL 25 MG/ML IJ SOLN: 6.2500 mg | INTRAMUSCULAR | Status: DC | PRN

## 2013-10-11 MED ORDER — LIDOCAINE HCL 1 % IJ SOLN
INTRAMUSCULAR | Status: DC | PRN
  Administered 2013-10-11: 9 mL

## 2013-10-11 MED ORDER — SODIUM CHLORIDE 0.9 % IR SOLN
Status: DC | PRN
  Administered 2013-10-11: 14:00:00

## 2013-10-11 MED ORDER — VANCOMYCIN HCL IN DEXTROSE 1-5 GM/200ML-% IV SOLN
INTRAVENOUS | Status: AC
  Filled 2013-10-11: qty 200

## 2013-10-11 MED ORDER — LACTATED RINGERS IV SOLN
INTRAVENOUS | Status: DC | PRN
  Administered 2013-10-11: 14:00:00 via INTRAVENOUS

## 2013-10-11 MED ORDER — LIDOCAINE HCL (PF) 1 % IJ SOLN
INTRAMUSCULAR | Status: AC
  Filled 2013-10-11: qty 30

## 2013-10-11 MED ORDER — HEPARIN SODIUM (PORCINE) 1000 UNIT/ML IJ SOLN
INTRAMUSCULAR | Status: AC
  Filled 2013-10-11: qty 1

## 2013-10-11 MED ORDER — 0.9 % SODIUM CHLORIDE (POUR BTL) OPTIME
TOPICAL | Status: DC | PRN
  Administered 2013-10-11: 1000 mL

## 2013-10-11 MED ORDER — MUPIROCIN 2 % EX OINT
TOPICAL_OINTMENT | Freq: Once | CUTANEOUS | Status: AC
  Administered 2013-10-11: 1 via NASAL
  Filled 2013-10-11: qty 22

## 2013-10-11 MED ORDER — HEPARIN SODIUM (PORCINE) 1000 UNIT/ML IJ SOLN
INTRAMUSCULAR | Status: DC | PRN
  Administered 2013-10-11: 3 mL

## 2013-10-11 MED ORDER — TRAMADOL HCL 50 MG PO TABS: 50.0000 mg | ORAL_TABLET | Freq: Four times a day (QID) | ORAL | Status: AC | PRN

## 2013-10-11 MED ORDER — HYDROMORPHONE HCL PF 1 MG/ML IJ SOLN: 0.2500 mg | INTRAMUSCULAR | Status: DC | PRN

## 2013-10-11 MED ORDER — PROPOFOL INFUSION 10 MG/ML OPTIME
INTRAVENOUS | Status: DC | PRN
  Administered 2013-10-11: 120 ug/kg/min via INTRAVENOUS

## 2013-10-11 MED ORDER — TRAMADOL HCL 50 MG PO TABS: 50.0000 mg | ORAL_TABLET | Freq: Four times a day (QID) | ORAL | Status: DC | PRN

## 2013-10-11 MED ORDER — LACTATED RINGERS IV SOLN
Freq: Once | INTRAVENOUS | Status: AC
  Administered 2013-10-11: 12:00:00 via INTRAVENOUS

## 2013-10-11 MED ORDER — LACTATED RINGERS IV SOLN: INTRAVENOUS | Status: DC | PRN

## 2013-10-11 MED ORDER — MIDAZOLAM HCL 2 MG/2ML IJ SOLN
INTRAMUSCULAR | Status: AC
  Filled 2013-10-11: qty 2

## 2013-10-11 SURGICAL SUPPLY — 45 items
BAG DECANTER FOR FLEXI CONT (MISCELLANEOUS) ×3 IMPLANT
CANISTER SUCTION 2500CC (MISCELLANEOUS) ×3 IMPLANT
COVER SURGICAL LIGHT HANDLE (MISCELLANEOUS) ×3 IMPLANT
DERMABOND ADVANCED (GAUZE/BANDAGES/DRESSINGS) ×2
DERMABOND ADVANCED .7 DNX12 (GAUZE/BANDAGES/DRESSINGS) ×1 IMPLANT
DRAPE C-ARM 42X72 X-RAY (DRAPES) IMPLANT
DRAPE CHEST BREAST 15X10 FENES (DRAPES) ×3 IMPLANT
ELECT REM PT RETURN 9FT ADLT (ELECTROSURGICAL) ×3
ELECTRODE REM PT RTRN 9FT ADLT (ELECTROSURGICAL) ×1 IMPLANT
GLOVE BIOGEL PI IND STRL 6.5 (GLOVE) ×1 IMPLANT
GLOVE BIOGEL PI IND STRL 7.0 (GLOVE) ×1 IMPLANT
GLOVE BIOGEL PI INDICATOR 6.5 (GLOVE) ×2
GLOVE BIOGEL PI INDICATOR 7.0 (GLOVE) ×2
GLOVE SURG SIGNA 7.5 PF LTX (GLOVE) ×3 IMPLANT
GOWN STRL REUS W/ TWL LRG LVL3 (GOWN DISPOSABLE) IMPLANT
GOWN STRL REUS W/ TWL XL LVL3 (GOWN DISPOSABLE) ×2 IMPLANT
GOWN STRL REUS W/TWL LRG LVL3 (GOWN DISPOSABLE)
GOWN STRL REUS W/TWL XL LVL3 (GOWN DISPOSABLE) ×4
GUIDEWIRE UNCOATED ST S 7038 (WIRE) IMPLANT
INTRODUCER 13FR (MISCELLANEOUS) IMPLANT
INTRODUCER COOK 11FR (CATHETERS) IMPLANT
KIT BASIN OR (CUSTOM PROCEDURE TRAY) ×3 IMPLANT
KIT PORT POWER 9.6FR MRI PREA (Catheter) IMPLANT
KIT PORT POWER ISP 8FR (Catheter) IMPLANT
KIT POWER CATH 8FR (Catheter) IMPLANT
KIT ROOM TURNOVER OR (KITS) ×3 IMPLANT
NEEDLE 18GX1X1/2 (RX/OR ONLY) (NEEDLE) ×3 IMPLANT
NEEDLE HYPO 25GX1X1/2 BEV (NEEDLE) ×3 IMPLANT
NS IRRIG 1000ML POUR BTL (IV SOLUTION) ×3 IMPLANT
PACK GENERAL/GYN (CUSTOM PROCEDURE TRAY) ×3 IMPLANT
PAD ARMBOARD 7.5X6 YLW CONV (MISCELLANEOUS) ×6 IMPLANT
SET SHEATH INTRODUCER 10FR (MISCELLANEOUS) IMPLANT
SUT MNCRL AB 4-0 PS2 18 (SUTURE) IMPLANT
SUT SILK 2 0 (SUTURE) ×2
SUT SILK 2 0SH CR/8 30 (SUTURE) ×3 IMPLANT
SUT SILK 2-0 18XBRD TIE 12 (SUTURE) ×1 IMPLANT
SUT VIC AB 3-0 SH 27 (SUTURE) ×2
SUT VIC AB 3-0 SH 27X BRD (SUTURE) ×1 IMPLANT
SUT VICRYL 4-0 PS2 18IN ABS (SUTURE) ×3 IMPLANT
SYR 20CC LL (SYRINGE) ×3 IMPLANT
SYR 5ML LL (SYRINGE) ×3 IMPLANT
SYR CONTROL 10ML LL (SYRINGE) ×6 IMPLANT
TOWEL OR 17X24 6PK STRL BLUE (TOWEL DISPOSABLE) ×3 IMPLANT
TOWEL OR 17X26 10 PK STRL BLUE (TOWEL DISPOSABLE) ×3 IMPLANT
WATER STERILE IRR 1000ML POUR (IV SOLUTION) ×3 IMPLANT

## 2013-10-11 NOTE — Anesthesia Procedure Notes (Signed)
Procedure Name: MAC Date/Time: 10/11/2013 2:05 PM Performed by: Carney Living Patient Re-evaluated:Patient Re-evaluated prior to inductionOxygen Delivery Method: Simple face mask Preoxygenation: Pre-oxygenation with 100% oxygen

## 2013-10-11 NOTE — H&P (View-Only) (Signed)
HPI: Heather Coffey returns today because of a malfunction of her Port-A-Cath.  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 She had a portacath placed in February for chemotherapy. She is now status post 6 cycles of carboplatin/Taxol weekly with concurrent radiotherapy with radiation therapy completed on 07/18/2013 and chemotherapy completed on 07/04/2013. She had an excellent clinical response on repeat CT scan.  Last week when she went in for lab work the check noted that her Port-A-Cath had flipped over and was unable to access it. She had not had any previous issues with a Port-A-Cath and it was functioning well and there was no difficulty accessing it or deaccessing it on her prior visit.  Past Medical History  Diagnosis Date  . Arthritis   . Anxiety   . Hyperlipidemia   . Tobacco abuse   . Shortness of breath     Hx: of breath with exertion  . Depression   . GERD (gastroesophageal reflux disease)   . H/O hiatal hernia   . Lung cancer    Past Surgical History  Procedure Laterality Date  . Tubal ligation    . Breast lumpectomy      Hx: of  . Video bronchoscopy with endobronchial ultrasound N/A 05/03/2013    Procedure: VIDEO BRONCHOSCOPY WITH ENDOBRONCHIAL ULTRASOUND;  Surgeon: Melrose Nakayama, MD;  Location: [place];  Service: Thoracic;  Laterality: N/A;  . Abdominal hysterectomy      still has ovaries per pt  . Nasal septum surgery    . Portacath placement Left 05/21/2013    Procedure: INSERTION PORT-A-CATH;  Surgeon: Melrose Nakayama, MD;  Location: [place];  Service: Thoracic;  Laterality: Left;  . Esophagogastroduodenoscopy N/A 09/13/2013    Procedure: ESOPHAGOGASTRODUODENOSCOPY (EGD);  Surgeon: Daneil Dolin, MD;  [facility];  Service: Endoscopy;  Laterality: N/A;  2:45-moved to 9:45 Heather Coffey to notify pt  . Maloney dilation N/A 09/13/2013    Procedure: Venia Minks DILATION;  Surgeon: Daneil Dolin, MD;  [facility];  Service: Endoscopy;  Laterality: N/A;  . Savory dilation N/A 09/13/2013    Procedure: SAVORY DILATION;  Surgeon: Daneil Dolin, MD;  [facility];  Service: Endoscopy;  Laterality: N/A;      Current Outpatient Prescriptions  Medication Sig Dispense Refill  . ALPRAZolam (XANAX) 0.5 MG tablet Take 0.25-0.5 mg by mouth daily as needed for anxiety (one daily usually).       Marland Kitchen aspirin EC 81 MG tablet Take 81 mg by mouth every other day.       . Diphenhyd-Hydrocort-Nystatin (FIRST-DUKES MOUTHWASH) SUSP Use as directed 5 mLs in the mouth or throat 4 (four) times daily.  420 mL  2  . esomeprazole (NEXIUM) 40 MG capsule Take 40 mg by mouth daily.      . fluconazole (DIFLUCAN) 200 MG tablet Take one tablet daily for thrush.  20 tablet  3  . FLUoxetine (PROZAC) 20 MG tablet Take 20 mg by mouth daily.      . fluticasone (FLONASE) 50 MCG/ACT nasal spray Place 2 sprays into both nostrils daily as needed for allergies.       . Fluticasone-Salmeterol (ADVAIR) 250-50 MCG/DOSE AEPB Inhale 1 puff into the lungs 2 (two) times daily.      Marland Kitchen guaifenesin (ROBITUSSIN) 100 MG/5ML syrup Take 200 mg by mouth 3 (three) times daily as needed for cough.      Marland Kitchen HYDROcodone-acetaminophen (HYCET)  7.5-325 mg/15 ml solution 1 tablespoonful of to every 4 hours as needed for pain  240 mL  0  . lidocaine-prilocaine (EMLA) cream Apply a quarter size amount to port site 1 hour prior to chemo. Do not rub in. Cover with plastic wrap.  30 g  3  . loratadine-pseudoephedrine (CLARITIN-D 24-HOUR) 10-240 MG per 24 hr tablet Take 1 tablet by mouth daily.      . metoCLOPramide (REGLAN) 10 MG tablet Take 1 tablet (10 mg total) by mouth 4 (four) times daily.  120 tablet  1  . montelukast (SINGULAIR) 10 MG tablet Take 10 mg by mouth daily as needed (for allergies).       . Multiple Vitamin (MULTIVITAMIN) tablet Take 2 tablets by mouth daily. 2 Multifusion gummies daily      . pravastatin (PRAVACHOL) 20 MG tablet Take 20 mg  by mouth daily.      . predniSONE (DELTASONE) 10 MG tablet To 2 tablets daily each morning or as directed.  60 tablet  2  . sucralfate (CARAFATE) 1 G tablet Take 1 g by mouth 4 (four) times daily.      Marland Kitchen triamcinolone cream (KENALOG) 0.1 % Apply 1 application topically 2 (two) times daily as needed (for itching).        No current facility-administered medications for this visit.   Facility-Administered Medications Ordered in Other Visits  Medication Dose Route Frequency Provider Last Rate Last Dose  . sodium chloride 0.9 % injection 10 mL  10 mL Intracatheter PRN Farrel Gobble, MD   10 mL at 07/04/13 1253    Physical Exam BP 128/76  Pulse 88  Resp 20  Ht 5\' 6"  (1.676 m)  Wt 105 lb (47.628 kg)  BMI 16.96 kg/m2  SpO84 26% 53 year old woman who appears older than her stated age Alert and oriented x3 with no focal deficits No cervical adenopathy or JVD Cardiac regular rate and rhythm normal S1 and S2 Lungs clear with equal breath sounds bilaterally Left upper chest incision well-healed, port has flipped Abdomen benign Extremities without clubbing cyanosis or edema  Diagnostic Tests: Chest x-ray CHEST - 1 VIEW  COMPARISON: 05/21/2013 and chest CT 08/16/2013  FINDINGS:  Left subclavian Port-A-Cath is present with tip overlying the region  of the SVC. The hub of the Port-A-Cath does appear to be flipped  upside down.  Lungs are adequately inflated with worsening opacification in the  left perihilar/midlung region with ill definition of the left heart  border. No evidence of effusion or pneumothorax. Remainder the exam  is unchanged.  IMPRESSION:  Worsening opacification in the left perihilar/ midlung region with  ill-defined left heart border. Findings may be due to recurrent  infectious process.  Left subclavian Port-A-Cath with tip in adequate position over the  region of the SVC. Port-A-Cath hub appears to be flipped upside down  supporting the clinical suspicion.   Electronically Signed  By: Marin Olp M.D.  On: 09/28/2013 09:01  Impression: 53 year old woman with stage IIIB lung cancer who has a Port-A-Cath in place. She has completed chemotherapy but is still having frequent blood draws, so she still needs the port for access. It clearly has turned over and is upside down in the pocket. Rather surprising that this happened so late after initial placement. She says that it was functioning normally and there was no difficulty on the prior use.  I discussed with her the the planned procedure which will be to reopen the incision and reposition the port and  secure it with additional sutures. We discussed the indications, risks, benefits, and alternatives. We'll plan to do this on an outpatient procedure. We should be able to do this with local and sedation. I did caution her that if the port is not functioning we would need to go ahead and place a new one at that time. However, I suspect that once it is repositioned which looked fine. She understands the risk include those related to sedation and local anesthesia, infection, which could result in the need to remove the port, as well as other unforeseeable complications. Should the port need to be replaced complications such as bleeding and pneumothorax would also be possible.  Plan: Port-A-Cath revision on Thursday, July 6

## 2013-10-11 NOTE — Anesthesia Postprocedure Evaluation (Signed)
Anesthesia Post Note  Patient: Heather Coffey  Procedure(s) Performed: Procedure(s) (LRB): LEFT PORT A CATH REVISION (Left)  Anesthesia type: MAC  Patient location: PACU  Post pain: Pain level controlled  Post assessment: Patient's Cardiovascular Status Stable  Last Vitals:  Filed Vitals:   10/11/13 1500  BP: 113/61  Pulse: 81  Temp:   Resp: 14    Post vital signs: Reviewed and stable  Level of consciousness: sedated  Complications: No apparent anesthesia complications

## 2013-10-11 NOTE — Transfer of Care (Signed)
Immediate Anesthesia Transfer of Care Note  Patient: Heather Coffey  Procedure(s) Performed: Procedure(s) with comments: LEFT PORT A CATH REVISION (Left) - LOCAL ANETHESIA PER DR. HENDRICKSON  Patient Location: PACU  Anesthesia Type:MAC  Level of Consciousness: awake, alert , oriented and patient cooperative  Airway & Oxygen Therapy: Patient Spontanous Breathing and Patient connected to nasal cannula oxygen  Post-op Assessment: Report given to PACU RN, Post -op Vital signs reviewed and stable and Patient moving all extremities X 4  Post vital signs: Reviewed and stable  Complications: No apparent anesthesia complications

## 2013-10-11 NOTE — Brief Op Note (Signed)
10/11/2013  2:50 PM  PATIENT:  Heather Coffey  53 y.o. female  PRE-OPERATIVE DIAGNOSIS:  MALPOSITION of PORTA-CATH  POST-OPERATIVE DIAGNOSIS:  MALPOSITION of PORTA-CATH  PROCEDURE:  Procedure(s) with comments: LEFT PORT A CATH REVISION (Left) - LOCAL ANETHESIA PER DR. Sayde Lish  SURGEON:  Surgeon(s) and Role:    * Melrose Nakayama, MD - Primary  PHYSICIAN ASSISTANT:   ASSISTANTS: none   ANESTHESIA:   local and IV sedation  EBL:     BLOOD ADMINISTERED:none  DRAINS: none   LOCAL MEDICATIONS USED:  LIDOCAINE   SPECIMEN:  No Specimen  DISPOSITION OF SPECIMEN:  N/A  COUNTS:  YES  PLAN OF CARE: Discharge to home after PACU  PATIENT DISPOSITION:  PACU - hemodynamically stable.   Delay start of Pharmacological VTE agent (>24hrs) due to surgical blood loss or risk of bleeding: not applicable

## 2013-10-11 NOTE — Interval H&P Note (Signed)
History and Physical Interval Note:  10/11/2013 1:43 PM  Heather Coffey  has presented today for surgery, with the diagnosis of FLIPPED PORTA-CATH  The various methods of treatment have been discussed with the patient and family. After consideration of risks, benefits and other options for treatment, the patient has consented to  Procedure(s) with comments: PORT A CATH REVISION (Left) - LOCAL ANETHESIA PER DR. Yeraldine Forney as a surgical intervention .  The patient's history has been reviewed, patient examined, no change in status, stable for surgery.  I have reviewed the patient's chart and labs.  Questions were answered to the patient's satisfaction.     Rosita Guzzetta C

## 2013-10-11 NOTE — Anesthesia Preprocedure Evaluation (Addendum)
Anesthesia Evaluation  Patient identified by MRN, date of birth, ID band Patient awake    Reviewed: Allergy & Precautions, H&P , NPO status , Patient's Chart, lab work & pertinent test results  History of Anesthesia Complications Negative for: history of anesthetic complications  Airway Mallampati: I TM Distance: >3 FB Neck ROM: Full    Dental  (+) Edentulous Upper, Edentulous Lower   Pulmonary shortness of breath and with exertion, COPD COPD inhaler, Current Smoker,    Pulmonary exam normal       Cardiovascular     Neuro/Psych PSYCHIATRIC DISORDERS Anxiety Depression    GI/Hepatic Neg liver ROS, hiatal hernia, GERD-  Medicated and Controlled,  Endo/Other  negative endocrine ROS  Renal/GU negative Renal ROS     Musculoskeletal   Abdominal   Peds  Hematology negative hematology ROS (+)   Anesthesia Other Findings   Reproductive/Obstetrics                        Anesthesia Physical Anesthesia Plan  ASA: III  Anesthesia Plan: MAC   Post-op Pain Management:    Induction: Intravenous  Airway Management Planned: Simple Face Mask  Additional Equipment:   Intra-op Plan:   Post-operative Plan:   Informed Consent: I have reviewed the patients History and Physical, chart, labs and discussed the procedure including the risks, benefits and alternatives for the proposed anesthesia with the patient or authorized representative who has indicated his/her understanding and acceptance.   Dental advisory given  Plan Discussed with: CRNA, Anesthesiologist and Surgeon  Anesthesia Plan Comments:        Anesthesia Quick Evaluation

## 2013-10-11 NOTE — Discharge Instructions (Signed)
Do not drive or engage in heavy physical activity for 24 hours  You may shower tomorrow  You may resume normal activities tomorrow, but you should not drive while taking the pain medication  Do not go in a swimming pool or hot tub until after you have seen me back in the office  Call [phone] for a follow up appointment in 2 weeks to check the incision

## 2013-10-12 ENCOUNTER — Encounter (HOSPITAL_COMMUNITY): Payer: Self-pay | Admitting: Thoracic Surgery (Cardiothoracic Vascular Surgery)

## 2013-10-12 NOTE — Op Note (Signed)
NAMEDULSE, RUTAN                  ACCOUNT NO.:  [id number]  MEDICAL RECORD NO.:  [id number]  LOCATION:  [facility]                         FACILITY:  [facility]  PHYSICIAN:  Revonda Standard. Roxan Hockey, M.D.DATE OF BIRTH:  1960/04/26  DATE OF PROCEDURE:  10/11/2013 DATE OF DISCHARGE:  10/11/2013                              OPERATIVE REPORT   PREOPERATIVE DIAGNOSIS:  Malposition of Port-A-Cath.  POSTOPERATIVE DIAGNOSIS:  Malposition of Port-A-Cath.  PROCEDURE:  Revision of Port-A-Cath left subclavian vein.  SURGEON:  Revonda Standard. Roxan Hockey, MD  ASSISTANT:  None.  ANESTHESIA:  Local with intravenous sedation.  FINDINGS:  Port had turned upside down. It was turned back into a normal position without difficulty.  No resistance with blood return, flushed easily.  CLINICAL NOTE:  Ms. Heather Coffey is a 53 year old woman with advanced lung cancer who had Port-A-Cath placement several months ago for chemotherapy.  On her most recent visit for blood draws, her port was noted to be upside down and could not be accessed.  She was advised to undergo revision to reposition the port and possibly replace the Port-A- Cath if it was not functional.  The indications, risks, benefits, and alternatives were discussed in detail with the patient.  She understood and accepted the risks and agreed to proceed.  OPERATIVE NOTE:  Mrs. Sharp was brought was brought to the operating room on October 11, 2013.  She had intravenous administration administered and monitored by the Anesthesia Service.  She tolerated that well and had a good level of sedation throughout the procedure.  The left chest was prepped and draped in usual sterile fashion.  Intravenous antibiotics were administered.  1% lidocaine was used for the local anesthetic. 10 mL was used to anesthetize the incision and the area around the port.  After ensuring adequate local anesthetic effect, an incision made through the previous scar and was extended slightly at the  inferior edge of the scar.  Hemostasis was achieved with electrocautery.  The subcutaneous tissue overlying the port was incised. There was no true capsule around the port.  The port was easily flipped back into a normal position.  A Huber needle was used to access the port and there was good blood return and the port flushed easily.  The port then was secured with 2-0 silk sutures to the pectoralis fascia.  All 4 anchor sites on the port were secured.  The port was once again accessed with the [facility] needle and again there was good blood return. 2.2 mL of concentrated heparin was instilled into the port.  The wound was copiously irrigated with the saline.  A final inspection was made for hemostasis.  The wound was closed in 2 layers with 3-0 Vicryl subcutaneous suture and a 4-0 Vicryl subcuticular suture.  Dermabond was applied.  The patient was taken from the operating room to the postanesthetic care unit in good condition.     Revonda Standard Roxan Hockey, M.D.     SCH/MEDQ  D:  10/11/2013  T:  10/12/2013  Job:  [id number]

## 2013-10-22 ENCOUNTER — Encounter: Payer: Self-pay | Admitting: Gastroenterology

## 2013-10-22 ENCOUNTER — Telehealth: Payer: Self-pay | Admitting: Gastroenterology

## 2013-10-22 ENCOUNTER — Ambulatory Visit: Payer: Managed Care, Other (non HMO) | Admitting: Gastroenterology

## 2013-10-22 NOTE — Telephone Encounter (Signed)
Pt was a no show

## 2013-10-22 NOTE — Telephone Encounter (Signed)
MAILED LETTER °

## 2013-10-30 ENCOUNTER — Ambulatory Visit (HOSPITAL_COMMUNITY)
Admission: RE | Admit: 2013-10-30 | Discharge: 2013-10-30 | Disposition: A | Discharge status: Home / Self Care | Payer: Managed Care, Other (non HMO) | Source: Ambulatory Visit | Attending: Hematology and Oncology | Admitting: Hematology and Oncology

## 2013-10-30 ENCOUNTER — Ambulatory Visit (HOSPITAL_COMMUNITY): Payer: Managed Care, Other (non HMO)

## 2013-10-30 DIAGNOSIS — C3402 Malignant neoplasm of left main bronchus: Secondary | ICD-10-CM

## 2013-10-30 DIAGNOSIS — C3400 Malignant neoplasm of unspecified main bronchus: Secondary | ICD-10-CM | POA: Insufficient documentation

## 2013-10-30 MED ORDER — IOHEXOL 300 MG/ML  SOLN
100.0000 mL | Freq: Once | INTRAMUSCULAR | Status: AC | PRN
  Administered 2013-10-30: 100 mL via INTRAVENOUS

## 2013-11-02 ENCOUNTER — Encounter (HOSPITAL_COMMUNITY): Payer: Self-pay

## 2013-11-02 ENCOUNTER — Encounter (HOSPITAL_COMMUNITY): Payer: Managed Care, Other (non HMO)

## 2013-11-02 ENCOUNTER — Encounter (HOSPITAL_COMMUNITY): Payer: Managed Care, Other (non HMO) | Attending: Hematology and Oncology

## 2013-11-02 VITALS — BP 99/74 | HR 108 | Temp 97.8°F | Resp 18 | Wt 107.8 lb

## 2013-11-02 DIAGNOSIS — J329 Chronic sinusitis, unspecified: Secondary | ICD-10-CM

## 2013-11-02 DIAGNOSIS — C3400 Malignant neoplasm of unspecified main bronchus: Secondary | ICD-10-CM

## 2013-11-02 DIAGNOSIS — D72819 Decreased white blood cell count, unspecified: Secondary | ICD-10-CM

## 2013-11-02 DIAGNOSIS — C3402 Malignant neoplasm of left main bronchus: Secondary | ICD-10-CM

## 2013-11-02 DIAGNOSIS — C771 Secondary and unspecified malignant neoplasm of intrathoracic lymph nodes: Secondary | ICD-10-CM | POA: Insufficient documentation

## 2013-11-02 DIAGNOSIS — C781 Secondary malignant neoplasm of mediastinum: Secondary | ICD-10-CM

## 2013-11-02 DIAGNOSIS — J40 Bronchitis, not specified as acute or chronic: Secondary | ICD-10-CM

## 2013-11-02 DIAGNOSIS — D6959 Other secondary thrombocytopenia: Secondary | ICD-10-CM

## 2013-11-02 DIAGNOSIS — Z452 Encounter for adjustment and management of vascular access device: Secondary | ICD-10-CM

## 2013-11-02 DIAGNOSIS — C3410 Malignant neoplasm of upper lobe, unspecified bronchus or lung: Secondary | ICD-10-CM | POA: Insufficient documentation

## 2013-11-02 LAB — CBC WITH DIFFERENTIAL/PLATELET
Basophils Absolute: 0 10*3/uL (ref 0.0–0.1)
Basophils Relative: 1 % (ref 0–1)
Eosinophils Absolute: 0.1 10*3/uL (ref 0.0–0.7)
Eosinophils Relative: 2 % (ref 0–5)
HEMATOCRIT: 36.7 % (ref 36.0–46.0)
HEMOGLOBIN: 11.7 g/dL — AB (ref 12.0–15.0)
LYMPHS PCT: 20 % (ref 12–46)
Lymphs Abs: 0.8 10*3/uL (ref 0.7–4.0)
MCH: 33.5 pg (ref 26.0–34.0)
MCHC: 31.9 g/dL (ref 30.0–36.0)
MCV: 105.2 fL — ABNORMAL HIGH (ref 78.0–100.0)
MONO ABS: 0.5 10*3/uL (ref 0.1–1.0)
MONOS PCT: 12 % (ref 3–12)
Neutro Abs: 2.7 10*3/uL (ref 1.7–7.7)
Neutrophils Relative %: 65 % (ref 43–77)
Platelets: 279 10*3/uL (ref 150–400)
RBC: 3.49 MIL/uL — ABNORMAL LOW (ref 3.87–5.11)
RDW: 16.1 % — ABNORMAL HIGH (ref 11.5–15.5)
WBC: 4.1 10*3/uL (ref 4.0–10.5)

## 2013-11-02 LAB — COMPREHENSIVE METABOLIC PANEL
ALT: 8 U/L (ref 0–35)
ANION GAP: 12 (ref 5–15)
AST: 9 U/L (ref 0–37)
Albumin: 3.1 g/dL — ABNORMAL LOW (ref 3.5–5.2)
Alkaline Phosphatase: 99 U/L (ref 39–117)
BUN: 9 mg/dL (ref 6–23)
CHLORIDE: 104 meq/L (ref 96–112)
CO2: 27 meq/L (ref 19–32)
CREATININE: 0.87 mg/dL (ref 0.50–1.10)
Calcium: 9.3 mg/dL (ref 8.4–10.5)
GFR calc Af Amer: 88 mL/min — ABNORMAL LOW (ref >90)
GFR, EST NON AFRICAN AMERICAN: 76 mL/min — AB (ref >90)
Glucose, Bld: 103 mg/dL — ABNORMAL HIGH (ref 70–99)
POTASSIUM: 3.8 meq/L (ref 3.7–5.3)
Sodium: 143 mEq/L (ref 137–147)
Total Protein: 7.1 g/dL (ref 6.0–8.3)

## 2013-11-02 LAB — LACTATE DEHYDROGENASE: LDH: 167 U/L (ref 94–250)

## 2013-11-02 MED ORDER — HEPARIN SOD (PORK) LOCK FLUSH 100 UNIT/ML IV SOLN
500.0000 [IU] | Freq: Once | INTRAVENOUS | Status: AC
  Administered 2013-11-02: 500 [IU] via INTRAVENOUS
  Filled 2013-11-02: qty 5

## 2013-11-02 MED ORDER — SODIUM CHLORIDE 0.9 % IJ SOLN
10.0000 mL | INTRAMUSCULAR | Status: DC | PRN
  Administered 2013-11-02: 10 mL via INTRAVENOUS

## 2013-11-02 NOTE — Progress Notes (Signed)
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 Follow-up  . Squamous cell lung cancer  . Chronic sinusitis    CURRENT THERAPY: Watchful expectation after completion of weekly carboplatin/Taxol with daily radiotherapy for stage III squamous cell carcinoma lung with last chemotherapy given on 07/04/2013 with radiotherapy completed on 07/18/2013, status post recent CT followup.  INTERVAL HISTORY: Heather Coffey 53 y.o. 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 She's had problems with sinus infections over the past 6-8 weeks has been treated without myotic, most recently Levaquin, by her family physician. She continues to be anorexia and an intermittently nauseated despite using Zofran and Reglan. She denies any lower extremity swelling or redness, significant cough, expectoration, hemoptysis, epistaxis, but has had decreased hearing acuity in the left ear. She denies any fever, night sweats, diarrhea, constipation, dysuria, hematuria, incontinence, or peripheral paresthesias.    MEDICAL HISTORY: Past Medical History  Diagnosis Date  . Arthritis   . Anxiety   . Hyperlipidemia   . Tobacco abuse   . Shortness of breath     Hx:  of breath with exertion  . Depression   . GERD (gastroesophageal reflux disease)   . H/O hiatal hernia   . 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    ALLERGIES:  is allergic to augmentin; codeine; and tetracyclines & related.  MEDICATIONS: has a current medication list which includes the following prescription(s): alprazolam, cyclobenzaprine, first-dukes mouthwash, esomeprazole, fluoxetine, fluticasone, fluticasone-salmeterol, levofloxacin, lidocaine-prilocaine, metoclopramide, montelukast, multivitamin, pravastatin, prednisone, sucralfate, loratadine-pseudoephedrine, and tramadol, and the following Facility-Administered Medications: sodium chloride and sodium chloride.  SURGICAL HISTORY:  Past Surgical History  Procedure Laterality Date  . Tubal ligation    . Video bronchoscopy with endobronchial ultrasound N/A 05/03/2013    Procedure: VIDEO BRONCHOSCOPY WITH ENDOBRONCHIAL ULTRASOUND;  Surgeon: Melrose Nakayama, MD;  Location: [facility];  Service: Thoracic;  Laterality: N/A;  . Nasal septum surgery    . Portacath placement Left 05/21/2013    Procedure: INSERTION PORT-A-CATH;  Surgeon: Melrose Nakayama, MD;  Location: [facility];  Service: Thoracic;  Laterality: Left;  . 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 schatzki's ring, distal one half the tubular esophagus severely inflamed. hiatal hernia bx= squamous mucosa with acute ulcer, candida  . Maloney dilation N/A 09/13/2013    Procedure: Venia Minks DILATION;  Surgeon: Daneil Dolin, MD;  [facility];  Service: Endoscopy;  Laterality: N/A;  . Savory dilation N/A 09/13/2013    Procedure: SAVORY DILATION;  Surgeon: Daneil Dolin, MD;  [facility];  Service: Endoscopy;  Laterality: N/A;  . Breast lumpectomy Right     Hx: of  . Vaginal hysterectomy      still has ovaries per pt  . Port a cath revision Left 10/11/2013    Procedure: LEFT PORT A CATH REVISION;  Surgeon: Melrose Nakayama, MD;  Location: [facility];  Service: Thoracic;  Laterality: Left;  LOCAL ANETHESIA PER DR. HENDRICKSON    FAMILY HISTORY: family history includes COPD in her brother, father, mother, and sister; Cancer in her sister; Diabetes in her sister; Hypercholesterolemia in her mother. There is no history of Colon cancer.  SOCIAL HISTORY:  reports that she has been smoking Cigarettes.  She has a 8.75 pack-year smoking history. She has never used smokeless tobacco. She reports that she does not drink alcohol or use illicit drugs.  REVIEW OF SYSTEMS:  Other than that discussed above is noncontributory.  PHYSICAL EXAMINATION: ECOG PERFORMANCE STATUS: 1 - Symptomatic but completely ambulatory  Blood pressure 99/74, pulse 108, temperature 97.8 F (36.6 C), temperature source Oral, resp. rate 18, weight 107 lb 12.8 oz (48.898 kg).  GENERAL:alert, no distress and comfortable SKIN: skin color, texture, turgor are normal, no rashes or significant lesions EYES: PERLA; Conjunctiva are pink and non-injected, sclera clear EARS: Left otitis media. SINUSES: No redness or tenderness over maxillary or ethmoid sinuses OROPHARYNX:no exudate, no erythema on lips, buccal mucosa, or tongue. NECK: supple, thyroid normal size, non-tender, without nodularity. No masses CHEST: Increased AP diameter with light port in place recently repositioned. LYMPH:  no palpable lymphadenopathy in the cervical, axillary or inguinal LUNGS: clear to auscultation and percussion with normal breathing effort. Scattered expiratory rhonchi bilaterally with no dullness to percussion. HEART: regular rate & rhythm and no murmurs. ABDOMEN:abdomen soft, non-tender and normal  bowel sounds MUSCULOSKELETAL:no cyanosis of digits and no clubbing. Range of motion normal.  NEURO: alert & oriented x 3 with fluent speech, no focal motor/sensory deficits   LABORATORY DATA: Office Visit on 11/02/2013  Component Date Value Ref Range Status  . WBC 11/02/2013 4.1  4.0 - 10.5 K/uL Final  . RBC 11/02/2013 3.49* 3.87 - 5.11 MIL/uL Final  . Hemoglobin 11/02/2013 11.7* 12.0 - 15.0 g/dL Final  . HCT 11/02/2013 36.7  36.0 - 46.0 % Final  . MCV 11/02/2013 105.2* 78.0 - 100.0 fL Final  . MCH 11/02/2013 33.5  26.0 - 34.0 pg Final  . MCHC 11/02/2013 31.9  30.0 - 36.0 g/dL Final  . RDW 11/02/2013 16.1* 11.5 - 15.5 % Final  . Platelets 11/02/2013 279  150 - 400 K/uL Final  . Neutrophils Relative % 11/02/2013 65  43 - 77 % Final  . Neutro Abs 11/02/2013 2.7  1.7 - 7.7 K/uL Final  . Lymphocytes Relative 11/02/2013 20  12 - 46 % Final  . Lymphs Abs 11/02/2013 0.8  0.7 - 4.0 K/uL Final  . Monocytes Relative 11/02/2013 12  3 - 12 % Final  . Monocytes Absolute 11/02/2013 0.5  0.1 - 1.0 K/uL Final  . Eosinophils Relative 11/02/2013 2  0 - 5 % Final  . Eosinophils Absolute 11/02/2013 0.1  0.0 - 0.7 K/uL Final  . Basophils Relative 11/02/2013 1  0 - 1 % Final  . Basophils Absolute 11/02/2013 0.0  0.0 - 0.1 K/uL Final  Admission on 10/11/2013, Discharged on 10/11/2013  Component Date Value Ref Range Status  . MRSA, PCR 10/11/2013 POSITIVE* NEGATIVE Final  . Staphylococcus aureus 10/11/2013 POSITIVE* NEGATIVE Final   Comment:  The Xpert SA Assay (FDA                          approved for NASAL specimens                          in patients over 46 years of age),                          is one component of                          a comprehensive surveillance                          program.  Test performance has                          been validated by [employer] for patients greater                          than or equal  to 72 year old.                          It is not intended                          to diagnose infection nor to                          guide or monitor treatment.  Marland Kitchen aPTT 10/11/2013 28  24 - 37 seconds Final  . WBC 10/11/2013 6.4  4.0 - 10.5 K/uL Final  . RBC 10/11/2013 2.74* 3.87 - 5.11 MIL/uL Final  . Hemoglobin 10/11/2013 9.3* 12.0 - 15.0 g/dL Final  . HCT 10/11/2013 30.1* 36.0 - 46.0 % Final  . MCV 10/11/2013 109.9* 78.0 - 100.0 fL Final  . MCH 10/11/2013 33.9  26.0 - 34.0 pg Final  . MCHC 10/11/2013 30.9  30.0 - 36.0 g/dL Final  . RDW 10/11/2013 16.5* 11.5 - 15.5 % Final  . Platelets 10/11/2013 208  150 - 400 K/uL Final  . Sodium 10/11/2013 145  137 - 147 mEq/L Final  . Potassium 10/11/2013 3.7  3.7 - 5.3 mEq/L Final  . Chloride 10/11/2013 105  96 - 112 mEq/L Final  . CO2 10/11/2013 26  19 - 32 mEq/L Final  . Glucose, Bld 10/11/2013 79  70 - 99 mg/dL Final  . BUN 10/11/2013 12  6 - 23 mg/dL Final  . Creatinine, Ser 10/11/2013 0.63  0.50 - 1.10 mg/dL Final  . Calcium 10/11/2013 8.4  8.4 - 10.5 mg/dL Final  . Total Protein 10/11/2013 5.8* 6.0 - 8.3 g/dL Final  . Albumin 10/11/2013 2.8* 3.5 - 5.2 g/dL Final  . AST 10/11/2013 12  0 - 37 U/L Final  . ALT 10/11/2013 20  0 - 35 U/L Final  . Alkaline Phosphatase 10/11/2013 85  39 - 117 U/L Final  . Total Bilirubin 10/11/2013 <0.2* 0.3 - 1.2 mg/dL Final  . GFR calc non Af Wyvonnia Lora  10/11/2013 >90  >90 mL/min Final  . GFR calc Af Amer 10/11/2013 >90  >90 mL/min Final   Comment: (NOTE)                          The eGFR has been calculated using the CKD EPI equation.                          This calculation has not been validated in all clinical situations.                          eGFR's persistently <90 mL/min signify possible Chronic Kidney                          Disease.  . Anion gap 10/11/2013 14  5 - 15 Final  . Prothrombin Time 10/11/2013 12.9  11.6 - 15.2 seconds Final  . INR 10/11/2013 0.97  0.00 - 1.49 Final    PATHOLOGY:  No new pathology.  Urinalysis No results found for this basename: colorurine,  appearanceur,  labspec,  phurine,  glucoseu,  hgbur,  bilirubinur,  ketonesur,  proteinur,  urobilinogen,  nitrite,  leukocytesur    RADIOGRAPHIC STUDIES: Dg Chest 2 View Within Previous 72 Hours.  Films Obtained On Friday Are Acceptable For Monday And Tuesday Cases  10/11/2013   CLINICAL DATA:  Preoperative films.  EXAM: CHEST  2 VIEW  COMPARISON:  Single view of the chest 09/28/2013. CT chest, abdomen and pelvis 08/16/2013.  FINDINGS: Left-sided Port-A-Cath remains in place, unchanged. Lingular airspace disease also appears unchanged. The right lung is clear. Heart size is normal. There is no pneumothorax or pleural effusion.  IMPRESSION: View No change in lingular airspace disease. Stable compared to prior exam.   Electronically Signed   By: Inge Rise M.D.   On: 10/11/2013 12:17   Ct Chest W Contrast  10/30/2013   CLINICAL DATA:  Restaging lung cancer.  EXAM: CT CHEST, ABDOMEN, AND PELVIS WITH CONTRAST  TECHNIQUE: Multidetector CT imaging of the chest, abdomen and pelvis was performed following the standard protocol during bolus administration of intravenous contrast.  CONTRAST:  140m OMNIPAQUE IOHEXOL 300 MG/ML  SOLN  COMPARISON:  08/16/2013  FINDINGS: CT CHEST FINDINGS  There is no pleural effusion identified. Cavitary lesion within the central left upper lobe measures 1.8 x 3.9 cm. Previously this measured 1.8 x 3.1 cm. There is been increase an postobstructive consolidation and atelectasis of the left upper lobe. Scattered, peripheral nodules within the left upper lobe are identified and appear peribronchovascular and are favored to represent sequelae of either aspiration, infection or inflammation. Nodular density in the right upper lobe is stable measuring 1 cm, image 27/series 3. Bronchiectasis and cystic change within the perihilar right upper lobe is stable from previous exam, image 24/series 3.  The heart  size appears normal. There is no pericardial effusion. Calcified atherosclerotic disease involves the thoracic aorta. Within the AP window region there is a focal area of soft tissue measuring 4.1 x 0.8 cm, image 25/series 2. Previously this measured 3 x 0.7 cm. No enlarged mediastinal or hilar lymph nodes. There is no axillary or supraclavicular adenopathy.  CT ABDOMEN AND PELVIS FINDINGS  No liver abnormality identified. Calcification involving the wall of the gallbladder is again noted. There are no gallstones or gallbladder wall thickening. No pericholecystic fluid. No biliary dilatation.  The pancreas is normal. This spleen is unremarkable. The adrenal glands are both normal. Normal appearance of both kidneys. Urinary bladder is normal. Previous hysterectomy.  Calcified atherosclerotic disease involves the abdominal aorta. No aneurysm. No upper abdominal adenopathy. There is no pelvic or inguinal adenopathy.  Review of the visualized bony structures is significant for mild degenerative disc disease. There is no aggressive lytic or sclerotic bone lesion noted.  IMPRESSION: 1. Cavitary lesion within the left perihilar region appears increased in size from previous exam. There is also thickening of the wall of this lesion. Associated increased in postobstructive atelectasis and consolidation is also identified. Cannot rule out recurrence of tumor. Further evaluation with PET-CT is advised. 2. Slight increase an nonspecific soft tissue within the AP window region of the left side of mediastinum. Attention to this area on followup PET-CT would be advised. 3. Calcification within the wall of the gallbladder is again noted.   Electronically Signed   By: Kerby Moors M.D.   On: 10/30/2013 15:29   Ct Abdomen Pelvis W Contrast  10/30/2013   CLINICAL DATA:  Restaging lung cancer.  EXAM: CT CHEST, ABDOMEN, AND PELVIS WITH CONTRAST  TECHNIQUE: Multidetector CT imaging of the chest, abdomen and pelvis was performed  following the standard protocol during bolus administration of intravenous contrast.  CONTRAST:  135m OMNIPAQUE IOHEXOL 300 MG/ML  SOLN  COMPARISON:  08/16/2013  FINDINGS: CT CHEST FINDINGS  There is no pleural effusion identified. Cavitary lesion within the central left upper lobe measures 1.8 x 3.9 cm. Previously this measured 1.8 x 3.1 cm. There is been increase an postobstructive consolidation and atelectasis of the left upper lobe. Scattered, peripheral nodules within the left upper lobe are identified and appear peribronchovascular and are favored to represent sequelae of either aspiration, infection or inflammation. Nodular density in the right upper lobe is stable measuring 1 cm, image 27/series 3. Bronchiectasis and cystic change within the perihilar right upper lobe is stable from previous exam, image 24/series 3.  The heart size appears normal. There is no pericardial effusion. Calcified atherosclerotic disease involves the thoracic aorta. Within the AP window region there is a focal area of soft tissue measuring 4.1 x 0.8 cm, image 25/series 2. Previously this measured 3 x 0.7 cm. No enlarged mediastinal or hilar lymph nodes. There is no axillary or supraclavicular adenopathy.  CT ABDOMEN AND PELVIS FINDINGS  No liver abnormality identified. Calcification involving the wall of the gallbladder is again noted. There are no gallstones or gallbladder wall thickening. No pericholecystic fluid. No biliary dilatation. The pancreas is normal. This spleen is unremarkable. The adrenal glands are both normal. Normal appearance of both kidneys. Urinary bladder is normal. Previous hysterectomy.  Calcified atherosclerotic disease involves the abdominal aorta. No aneurysm. No upper abdominal adenopathy. There is no pelvic or inguinal adenopathy.  Review of the visualized bony structures is significant for mild degenerative disc disease. There is no aggressive lytic or sclerotic bone lesion noted.  IMPRESSION: 1.  Cavitary lesion within the left perihilar region appears increased in size from previous exam. There is also thickening of the wall of this lesion. Associated increased in postobstructive atelectasis and consolidation is also identified. Cannot rule out recurrence of tumor. Further evaluation with PET-CT is advised. 2. Slight increase an nonspecific soft tissue within the AP window region of the left side of mediastinum. Attention to this area on followup PET-CT would be advised. 3. Calcification within the wall of the gallbladder is again noted.   Electronically Signed  By: Kerby Moors M.D.   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 2. Chronic obstructive pulmonary disease, unfortunately still smoking.  3. History of right lung abscess, status post medical treatment only occurring about 3 or 4 years ago requiring hospitalization and intravenous antibiotics with no antecedent history of aspiration or loss of consciousness.  4. Gastroesophageal reflux disease, controlled, status post EGD with dilatation of the stricture.  5. 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  6. Leukopenia and thrombocytopenia from previous chemotherapy.       PLAN:  #1. PET scan to clarify  CT scan abnormalities. #2. Sudafed 240 mg daily. #3. Continue Levaquin. #4. DC Zofran and Reglan. #5. Prochlorperazine 10 mg 3 times a day before meals to control nausea. #6. Followup in 3 weeks after PET scan is completed.   All questions were answered. The patient knows to call the clinic with any problems,  questions or concerns. We can certainly see the patient much sooner if necessary.   I spent 30 minutes counseling the patient face to face. The total time spent in the appointment was 40 minutes.    Doroteo Bradford, MD 11/02/2013 11:01 AM  DISCLAIMER:  This note was dictated with voice recognition software.  Similar sounding words can inadvertently be transcribed inaccurately and may not be corrected upon review.

## 2013-11-02 NOTE — Patient Instructions (Addendum)
.  [facility] Discharge Instructions  RECOMMENDATIONS MADE BY THE CONSULTANT AND ANY TEST RESULTS WILL BE SENT TO YOUR REFERRING PHYSICIAN.  EXAM FINDINGS BY THE PHYSICIAN TODAY AND SIGNS OR SYMPTOMS TO REPORT TO CLINIC OR PRIMARY PHYSICIAN: Exam and findings as discussed by Dr. Barnet Glasgow.  Stop zofran and reglan   Use the compazine for nausea  Take the sudafed 24 hr version (240mg )  INSTRUCTIONS/FOLLOW-UP:  PET scan in about 10 days    Thank you for choosing [facility] to provide your oncology and hematology care.  To afford each patient quality time with our providers, please arrive at least 15 minutes before your scheduled appointment time.  With your help, our goal is to use those 15 minutes to complete the necessary work-up to ensure our physicians have the information they need to help with your evaluation and healthcare recommendations.    Effective January 1st, 2014, we ask that you re-schedule your appointment with our physicians should you arrive 10 or more minutes late for your appointment.  We strive to give you quality time with our providers, and arriving late affects you and other patients whose appointments are after yours.    Again, thank you for choosing [facility].  Our hope is that these requests will decrease the amount of time that you wait before being seen by our physicians.       _____________________________________________________________  Should you have questions after your visit to [facility], please contact our office at [phone] between the hours of 8:30 a.m. and 4:30 p.m.  Voicemails left after 4:30 p.m. will not be returned until the following business day.  For prescription refill requests, have your pharmacy contact our office with your prescription refill request.    _______________________________________________________________  We hope that we have given you very good care.  You may  receive a patient satisfaction survey in the mail, please complete it and return it as soon as possible.  We value your feedback!  _______________________________________________________________  Have you asked about our STAR program?  STAR stands for Survivorship Training and Rehabilitation, and this is a nationally recognized cancer care program that focuses on survivorship and rehabilitation.  Cancer and cancer treatments may cause problems, such as, pain, making you feel tired and keeping you from doing the things that you need or want to do. Cancer rehabilitation can help. Our goal is to reduce these troubling effects and help you have the best quality of life possible.  You may receive a survey from a nurse that asks questions about your current state of health.  Based on the survey results, all eligible patients will be referred to the [facility] program for an evaluation so we can better serve you!  A frequently asked questions sheet is available upon request.

## 2013-11-02 NOTE — Progress Notes (Signed)
Heather Coffey presented for Portacath access and flush. Proper placement of portacath confirmed by CXR. Portacath located rt chest wall accessed with  H 20 needle. Good blood return present. Portacath flushed with 34ml NS and 500U/2ml Heparin and needle removed intact. Procedure without incident. Patient tolerated procedure well.

## 2013-11-09 ENCOUNTER — Encounter (HOSPITAL_COMMUNITY)
Admission: RE | Admit: 2013-11-09 | Discharge: 2013-11-09 | Disposition: A | Discharge status: Home / Self Care | Payer: Managed Care, Other (non HMO) | Source: Ambulatory Visit | Attending: Hematology and Oncology | Admitting: Hematology and Oncology

## 2013-11-09 ENCOUNTER — Encounter (HOSPITAL_COMMUNITY): Payer: Self-pay

## 2013-11-09 DIAGNOSIS — C3410 Malignant neoplasm of upper lobe, unspecified bronchus or lung: Secondary | ICD-10-CM | POA: Insufficient documentation

## 2013-11-09 LAB — GLUCOSE, CAPILLARY: Glucose-Capillary: 78 mg/dL (ref 70–99)

## 2013-11-09 MED ORDER — FLUDEOXYGLUCOSE F - 18 (FDG) INJECTION: 6.3000 | Freq: Once | INTRAVENOUS | Status: AC | PRN

## 2013-11-13 ENCOUNTER — Ambulatory Visit: Payer: Managed Care, Other (non HMO) | Admitting: Thoracic Surgery (Cardiothoracic Vascular Surgery)

## 2013-11-13 ENCOUNTER — Other Ambulatory Visit (HOSPITAL_COMMUNITY): Payer: Managed Care, Other (non HMO)

## 2013-11-13 ENCOUNTER — Ambulatory Visit (INDEPENDENT_AMBULATORY_CARE_PROVIDER_SITE_OTHER): Payer: Managed Care, Other (non HMO) | Admitting: *Deleted

## 2013-11-13 DIAGNOSIS — C3410 Malignant neoplasm of upper lobe, unspecified bronchus or lung: Secondary | ICD-10-CM

## 2013-11-13 DIAGNOSIS — Z09 Encounter for follow-up examination after completed treatment for conditions other than malignant neoplasm: Secondary | ICD-10-CM

## 2013-11-13 NOTE — Progress Notes (Signed)
Heather Coffey returns after revision of her left subclavian  port-a cath by Dr. Roxan Hockey on 10/11/13.  The area is completely healed and looks great.  Her port has been accessed without difficulty.  She will return as needed.

## 2013-11-14 ENCOUNTER — Encounter (HOSPITAL_COMMUNITY): Payer: Managed Care, Other (non HMO) | Attending: Hematology and Oncology

## 2013-11-14 ENCOUNTER — Encounter (HOSPITAL_COMMUNITY): Payer: Self-pay

## 2013-11-14 ENCOUNTER — Encounter (HOSPITAL_COMMUNITY): Payer: Managed Care, Other (non HMO)

## 2013-11-14 VITALS — BP 100/66 | HR 98 | Temp 98.4°F | Resp 18 | Wt 108.8 lb

## 2013-11-14 DIAGNOSIS — C7802 Secondary malignant neoplasm of left lung: Secondary | ICD-10-CM

## 2013-11-14 DIAGNOSIS — C7800 Secondary malignant neoplasm of unspecified lung: Secondary | ICD-10-CM

## 2013-11-14 DIAGNOSIS — C3400 Malignant neoplasm of unspecified main bronchus: Secondary | ICD-10-CM | POA: Insufficient documentation

## 2013-11-14 DIAGNOSIS — C771 Secondary and unspecified malignant neoplasm of intrathoracic lymph nodes: Secondary | ICD-10-CM

## 2013-11-14 DIAGNOSIS — C3402 Malignant neoplasm of left main bronchus: Secondary | ICD-10-CM

## 2013-11-14 DIAGNOSIS — C3410 Malignant neoplasm of upper lobe, unspecified bronchus or lung: Secondary | ICD-10-CM | POA: Insufficient documentation

## 2013-11-14 DIAGNOSIS — C7801 Secondary malignant neoplasm of right lung: Secondary | ICD-10-CM

## 2013-11-14 NOTE — Progress Notes (Signed)
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 Follow-up  . Lung Cancer    CURRENT THERAPY: Watchful expectation. 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  INTERVAL HISTORY: Heather Coffey 53 y.o. 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 Appetite has improved using Compazine. She denies any headache, worsening cough, hemoptysis, lower extremity swelling or redness, diarrhea, constipation, incontinence, skin rash,  or seizures.    MEDICAL HISTORY: Past Medical History  Diagnosis Date  . Arthritis   . Anxiety   . Hyperlipidemia   . Tobacco abuse   . Shortness of breath     Hx: of breath with exertion  . Depression   . GERD (gastroesophageal reflux disease)   . H/O hiatal hernia   . 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    ALLERGIES:  is allergic to augmentin; codeine; and tetracyclines & related.  MEDICATIONS: has a current medication list which includes the following prescription(s):  alprazolam, cyclobenzaprine, esomeprazole, fluoxetine, fluticasone-salmeterol, ibuprofen, lidocaine-prilocaine, multivitamin, pravastatin, prednisone, promethazine, sucralfate, first-dukes mouthwash, fluticasone, loratadine-pseudoephedrine, metoclopramide, montelukast, and tramadol, and the following Facility-Administered Medications: sodium chloride.  SURGICAL HISTORY:  Past Surgical History  Procedure Laterality Date  . Tubal ligation    . Video bronchoscopy with endobronchial ultrasound N/A 05/03/2013    Procedure: VIDEO BRONCHOSCOPY WITH ENDOBRONCHIAL ULTRASOUND;  Surgeon: Melrose Nakayama, MD;  Location: [facility];  Service: Thoracic;  Laterality: N/A;  . Nasal septum surgery    . Portacath placement Left 05/21/2013    Procedure: INSERTION PORT-A-CATH;  Surgeon: Melrose Nakayama, MD;  Location: [facility];  Service: Thoracic;  Laterality: Left;  . 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 schatzki's ring, distal one half the tubular esophagus severely inflamed. hiatal hernia bx= squamous mucosa with acute ulcer, candida  . Maloney dilation N/A 09/13/2013    Procedure: Venia Minks DILATION;  Surgeon: Daneil Dolin, MD;  [facility];  Service: Endoscopy;  Laterality: N/A;  . Savory dilation N/A 09/13/2013    Procedure: SAVORY DILATION;  Surgeon: Daneil Dolin, MD;  Location: [facility];  Service: Endoscopy;  Laterality: N/A;  . Breast lumpectomy Right     Hx: of  . Vaginal hysterectomy      still has ovaries per pt  . Port a cath revision Left 10/11/2013    Procedure: LEFT PORT A CATH REVISION;  Surgeon: Melrose Nakayama, MD;  Location: [facility];  Service: Thoracic;  Laterality: Left;  LOCAL ANETHESIA PER DR. HENDRICKSON    FAMILY HISTORY: family history includes COPD in her brother, father, mother, and sister; Cancer in her sister; Diabetes in her sister; Hypercholesterolemia in  her mother. There is no history of  Colon cancer.  SOCIAL HISTORY:  reports that she has been smoking Cigarettes.  She has a 8.75 pack-year smoking history. She has never used smokeless tobacco. She reports that she does not drink alcohol or use illicit drugs.  REVIEW OF SYSTEMS:  Other than that discussed above is noncontributory.  PHYSICAL EXAMINATION: ECOG PERFORMANCE STATUS: 1 - Symptomatic but completely ambulatory  Blood pressure 100/66, pulse 98, temperature 98.4 F (36.9 C), temperature source Oral, resp. rate 18, weight 108 lb 12.8 oz (49.351 kg).  GENERAL:alert, no distress and comfortable SKIN: skin color, texture, turgor are normal, no rashes or significant lesions. Patchy alopecia. EYES: PERLA; Conjunctiva are pink and non-injected, sclera clear SINUSES: No redness or tenderness over maxillary or ethmoid sinuses OROPHARYNX:no exudate, no erythema on lips, buccal mucosa, or tongue. NECK: supple, thyroid normal size, non-tender, without nodularity. No masses CHEST: Increased AP diameter with light port in place. LYMPH:  no palpable lymphadenopathy in the cervical, axillary or inguinal LUNGS: clear to auscultation and percussion with normal breathing effort HEART: regular rate & rhythm and no murmurs. ABDOMEN:abdomen soft, non-tender and normal bowel sounds MUSCULOSKELETAL:no cyanosis of digits and no clubbing. Range of motion normal.  NEURO: alert & oriented x 3 with fluent speech, no focal motor/sensory deficits   LABORATORY DATA: Hospital Outpatient Visit on 11/09/2013  Component Date Value Ref Range Status  . Glucose-Capillary 11/09/2013 78  70 - 99 mg/dL Final  Office Visit on 11/02/2013  Component Date Value Ref Range Status  . WBC 11/02/2013 4.1  4.0 - 10.5 K/uL Final  . RBC 11/02/2013 3.49* 3.87 - 5.11 MIL/uL Final  . Hemoglobin 11/02/2013 11.7* 12.0 - 15.0 g/dL Final  . HCT 11/02/2013 36.7  36.0 - 46.0 % Final  . MCV 11/02/2013 105.2* 78.0 - 100.0 fL Final  . MCH 11/02/2013 33.5  26.0 - 34.0 pg  Final  . MCHC 11/02/2013 31.9  30.0 - 36.0 g/dL Final  . RDW 11/02/2013 16.1* 11.5 - 15.5 % Final  . Platelets 11/02/2013 279  150 - 400 K/uL Final  . Neutrophils Relative % 11/02/2013 65  43 - 77 % Final  . Neutro Abs 11/02/2013 2.7  1.7 - 7.7 K/uL Final  . Lymphocytes Relative 11/02/2013 20  12 - 46 % Final  . Lymphs Abs 11/02/2013 0.8  0.7 - 4.0 K/uL Final  . Monocytes Relative 11/02/2013 12  3 - 12 % Final  . Monocytes Absolute 11/02/2013 0.5  0.1 - 1.0 K/uL Final  . Eosinophils Relative 11/02/2013 2  0 - 5 % Final  . Eosinophils Absolute 11/02/2013 0.1  0.0 - 0.7 K/uL Final  . Basophils Relative 11/02/2013 1  0 - 1 % Final  . Basophils Absolute 11/02/2013 0.0  0.0 - 0.1 K/uL Final  . Sodium 11/02/2013 143  137 - 147 mEq/L Final  . Potassium 11/02/2013 3.8  3.7 - 5.3 mEq/L Final  . Chloride 11/02/2013 104  96 - 112 mEq/L Final  . CO2 11/02/2013 27  19 - 32 mEq/L Final  . Glucose, Bld 11/02/2013 103* 70 - 99 mg/dL Final  . BUN 11/02/2013 9  6 - 23 mg/dL Final  . Creatinine, Ser 11/02/2013 0.87  0.50 - 1.10 mg/dL Final  . Calcium 11/02/2013 9.3  8.4 - 10.5 mg/dL Final  . Total Protein 11/02/2013 7.1  6.0 - 8.3 g/dL Final  . Albumin 11/02/2013 3.1* 3.5 - 5.2 g/dL Final  . AST 11/02/2013 9  0 - 37  U/L Final  . ALT 11/02/2013 8  0 - 35 U/L Final  . Alkaline Phosphatase 11/02/2013 99  39 - 117 U/L Final  . Total Bilirubin 11/02/2013 <0.2* 0.3 - 1.2 mg/dL Final  . GFR calc non Af Amer 11/02/2013 76* >90 mL/min Final  . GFR calc Af Amer 11/02/2013 88* >90 mL/min Final   Comment: (NOTE)                          The eGFR has been calculated using the CKD EPI equation.                          This calculation has not been validated in all clinical situations.                          eGFR's persistently <90 mL/min signify possible Chronic Kidney                          Disease.  . Anion gap 11/02/2013 12  5 - 15 Final  . LDH 11/02/2013 167  94 - 250 U/L Final    PATHOLOGY: Squamous  cell carcinoma.  Urinalysis No results found for this basename: colorurine,  appearanceur,  labspec,  phurine,  glucoseu,  hgbur,  bilirubinur,  ketonesur,  proteinur,  urobilinogen,  nitrite,  leukocytesur    RADIOGRAPHIC STUDIES: Ct Chest W Contrast  10/30/2013   CLINICAL DATA:  Restaging lung cancer.  EXAM: CT CHEST, ABDOMEN, AND PELVIS WITH CONTRAST  TECHNIQUE: Multidetector CT imaging of the chest, abdomen and pelvis was performed following the standard protocol during bolus administration of intravenous contrast.  CONTRAST:  115m OMNIPAQUE IOHEXOL 300 MG/ML  SOLN  COMPARISON:  08/16/2013  FINDINGS: CT CHEST FINDINGS  There is no pleural effusion identified. Cavitary lesion within the central left upper lobe measures 1.8 x 3.9 cm. Previously this measured 1.8 x 3.1 cm. There is been increase an postobstructive consolidation and atelectasis of the left upper lobe. Scattered, peripheral nodules within the left upper lobe are identified and appear peribronchovascular and are favored to represent sequelae of either aspiration, infection or inflammation. Nodular density in the right upper lobe is stable measuring 1 cm, image 27/series 3. Bronchiectasis and cystic change within the perihilar right upper lobe is stable from previous exam, image 24/series 3.  The heart size appears normal. There is no pericardial effusion. Calcified atherosclerotic disease involves the thoracic aorta. Within the AP window region there is a focal area of soft tissue measuring 4.1 x 0.8 cm, image 25/series 2. Previously this measured 3 x 0.7 cm. No enlarged mediastinal or hilar lymph nodes. There is no axillary or supraclavicular adenopathy.  CT ABDOMEN AND PELVIS FINDINGS  No liver abnormality identified. Calcification involving the wall of the gallbladder is again noted. There are no gallstones or gallbladder wall thickening. No pericholecystic fluid. No biliary dilatation. The pancreas is normal. This spleen is unremarkable.  The adrenal glands are both normal. Normal appearance of both kidneys. Urinary bladder is normal. Previous hysterectomy.  Calcified atherosclerotic disease involves the abdominal aorta. No aneurysm. No upper abdominal adenopathy. There is no pelvic or inguinal adenopathy.  Review of the visualized bony structures is significant for mild degenerative disc disease. There is no aggressive lytic or sclerotic bone lesion noted.  IMPRESSION: 1. Cavitary lesion within the left perihilar region appears increased  in size from previous exam. There is also thickening of the wall of this lesion. Associated increased in postobstructive atelectasis and consolidation is also identified. Cannot rule out recurrence of tumor. Further evaluation with PET-CT is advised. 2. Slight increase an nonspecific soft tissue within the AP window region of the left side of mediastinum. Attention to this area on followup PET-CT would be advised. 3. Calcification within the wall of the gallbladder is again noted.   Electronically Signed   By: Kerby Moors M.D.   On: 10/30/2013 15:29     11/09/2013   CLINICAL DATA:  Subsequent treatment strategy for Lung cancer.  EXAM: NUCLEAR MEDICINE PET SKULL BASE TO THIGH  TECHNIQUE: 6.3 mCi F-18 FDG was injected intravenously. Full-ring PET imaging was performed from the skull base to thigh after the radiotracer. CT data was obtained and used for attenuation correction and anatomic localization.  FASTING BLOOD GLUCOSE:  Value: 78 mg/dl  COMPARISON:  05/01/2013 an CT chest 10/30/2013  FINDINGS: NECK  Enlarged left supraclavicular lymph node measures 1 cm and has an SUV max equal to 16.9. Previously this measured 6 mm and had an SUV max equal to 2.5.  CHEST  Interval resolution of previous hypermetabolic left hilar/AP window mass compared with PET-CT from 05/01/2013. No significant FDG uptake within the AP window region identified. Left upper lobe perihilar cavitary lesion exhibits malignant range FDG uptake.  The SUV max within this area is equal to 4.4. Scattered pulmonary nodules within the left upper lobe are identified and exhibit increased FDG uptake. Index nodule within the anterior left upper lobe measures 8 mm and has an SUV max equal to 2.8.  ABDOMEN/PELVIS  No abnormal hypermetabolic activity within the liver, pancreas, adrenal glands, or spleen. Calcification within the wall of the gallbladder is again noted. No hypermetabolic lymph nodes in the abdomen or pelvis. Calcified atherosclerotic disease involves the abdominal aorta.  SKELETON  No focal hypermetabolic activity to suggest skeletal metastasis.  IMPRESSION: 1. Examination is positive for recurrent tumor. 2. Enlarged left supraclavicular lymph node exhibits malignant range FDG uptake compatible with metastatic adenopathy. 3. Malignant range FDG uptake is associated with cavitary lesion within the perihilar left upper lobe compatible with local tumor recurrence. 4. Scattered irregular nodular densities in the left upper lobe also exhibit malignant range FDG uptake which may be postinflammatory or represent areas of metastasis.   Electronically Signed   By: Kerby Moors M.D.   On: 11/09/2013 13:01    ASSESSMENT:  #1. 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 2. Chronic obstructive pulmonary disease, unfortunately still smoking.  3. History of right lung abscess, status post medical treatment only occurring about 3 or 4 years ago requiring hospitalization and intravenous antibiotics with no antecedent history of aspiration or loss of consciousness.  4. Gastroesophageal reflux disease, controlled, status post EGD with dilatation of the stricture.  5. 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  6. Leukopenia and thrombocytopenia from previous chemotherapy.      PLAN:  #1. Discussion was held with the patient regarding salvage treatment  utilizing Nivolumab intravenously every 2 weeks. Information about the drug was given to the patient. Plans to start treatment in one week with followup visit in 2 weeks. She should have CBC, chem profile, LDH done along with TSH next week.   All questions were answered. The patient knows to call the clinic with any problems, questions or concerns. We can  certainly see the patient much sooner if necessary.   I spent 30 minutes counseling the patient face to face. The total time spent in the appointment was 40 minutes.    Doroteo Bradford, MD 11/14/2013 11:56 AM  DISCLAIMER:  This note was dictated with voice recognition software.  Similar sounding words can inadvertently be transcribed inaccurately and may not be corrected upon review.

## 2013-11-14 NOTE — Patient Instructions (Addendum)
[place]   CHEMOTHERAPY INSTRUCTIONS  Heather Coffey is indicated for the treatment of patients with metastatic squamous non-small cell lung cancer (NSCLC) with progression on or after platinum-based chemotherapy.   It is an immunotherapy, which works with your own immune system to fight cancer.   Your immune system is normally your body's first defense against threats like lung cancer, but sometimes lung cancer sends a signal that can prevent your immune system from doing its job. Opdivo blocks this signal, helping to restore the immune response against the tumor cells. While doing so, Opdivo could also affect non-tumor cells. Because Opdivo works with your immune system, it may cause your immune system to attack normal organs and tissues in your body and can affect the way they work. These problems may happen anytime during or even after your treatment has ended.   Administration - this is a one hour infusion - no premeds needed. This medication is given every 2 weeks.   Most common Adverse Reactions: fatigue, shortness of breath, pain in muscles, bones, & joints, decreased appetite, cough, nausea, & constipation.   Things you need to watch for and alert [place] immediately if it is happening:   New or worsening cough, chest pain, shortness of breath, diarrhea (or more loose stools than usual), blood in your stools or dark tarry sticky stools, severe stomach pain or tenderness, yellowing of your skin/eyes, severe nausea/vomiting, pain on the right side of abdomen, drowsiness, dark urine (tea colored), bleeding/bruising more easily than usual, decrease in the amount of urine, blood in your urine, swelling in your ankles, loss of appetite, headaches that will not go away or unusual headaches, extreme tiredness, weight gain or weight loss, changes in mood/behavior, dizziness/fainting, Hair loss, feeling cold, voice getting deeper.   You can go to [email or web address] to find patient  support resources or more information.   Call 1-800-Opdivo-1 to contact a Care Navigator (in addition to [facility] staff). They are available by phone M-F 8am-8pm.   EDUCATIONAL MATERIALS GIVEN AND REVIEWED: Specific Instructions Sheets: Opdivo   SELF CARE ACTIVITIES WHILE ON CHEMOTHERAPY: Increase your fluid intake 48 hours prior to treatment and drink at least 2 quarts per day after treatment., No alcohol intake., No aspirin or other medications unless approved by your oncologist., Eat foods that are light and easy to digest., Eat foods at cold or room temperature., No fried, fatty, or spicy foods immediately before or after treatment., Have teeth cleaned professionally before starting treatment. Keep dentures and partial plates clean., Use soft toothbrush and do not use mouthwashes that contain alcohol. Biotene is a good mouthwash that is available at most pharmacies or may be ordered by calling [phone]., Use warm salt water gargles (1 teaspoon salt per 1 quart warm water) before and after meals and at bedtime. Or you may rinse with 2 tablespoons of three -percent hydrogen peroxide mixed in eight ounces of water., Always use sunscreen with SPF (Sun Protection Factor) of 30 or higher., Use your nausea medication as directed to prevent nausea., Use your stool softener or laxative as directed to prevent constipation. and Use your anti-diarrheal medication as directed to stop diarrhea.   MEDICATIONS: You have been given prescriptions for the following medications:  Over-the-Counter Meds:  Colace - this is a stool softener. Take 100mg  capsule 2-6 times a day as needed. If you have to take more than 6 capsules of Colace a day call the [facility].  Senna -  this is a mild laxative used to treat mild constipation. May take 2 tabs by mouth daily or up to twice a day as needed for mild constipation.  Milk of Magnesia - this is a laxative used to treat moderate to severe  constipation. May take 2-4 tablespoons every 8 hours as needed. May increase to 8 tablespoons x 1 dose and if no bowel movement call the [facility].  Imodium - this is for diarrhea. Take 2 tabs after 1st loose stool and then 1 tab after each loose stool until you go a total of 12 hours without a loose stool. Call [facility] if loose stools continue.   SYMPTOMS TO REPORT AS SOON AS POSSIBLE AFTER TREATMENT:  FEVER GREATER THAN 100.5 F  CHILLS WITH OR WITHOUT FEVER  NAUSEA AND VOMITING THAT IS NOT CONTROLLED WITH YOUR NAUSEA MEDICATION  UNUSUAL SHORTNESS OF BREATH  UNUSUAL BRUISING OR BLEEDING  TENDERNESS IN MOUTH AND THROAT WITH OR WITHOUT PRESENCE OF ULCERS  URINARY PROBLEMS  BOWEL PROBLEMS  UNUSUAL RASH    Wear comfortable clothing and clothing appropriate for easy access to any Portacath or PICC line. Let us know if there is anything that we can do to make your therapy better!      I have been informed and understand all of the instructions given to me and have received a copy. I have been instructed to call the clinic [phone] or my family physician as soon as possible for continued medical care, if indicated. I do not have any more questions at this time but understand that I may call the [facility] or the Patient Navigator at [phone] during office hours should I have questions or need assistance in obtaining follow-up care.      _________________________________________      _______________     __________ Signature of Patient or Authorized Representative        Date                            Time      _________________________________________ Nurse's Signature      Nivolumab injection What is this medicine? NIVOLUMAB (nye VOL ue mab) is used to treat certain types of melanoma and lung cancer. This medicine may be used for other purposes; ask your health care provider or pharmacist if you have questions. COMMON BRAND NAME(S):  Opdivo What should I tell my health care provider before I take this medicine? They need to know if you have any of these conditions: -eye disease, vision problems -history of pancreatitis -immune system problems -inflammatory bowel disease -kidney disease -liver disease -lung disease -lupus -myasthenia gravis -multiple sclerosis -organ transplant -stomach or intestine problems -thyroid disease -tingling of the fingers or toes, or other nerve disorder -an unusual or allergic reaction to nivolumab, other medicines, foods, dyes, or preservatives -pregnant or trying to get pregnant -breast-feeding How should I use this medicine? This medicine is for infusion into a vein. It is given by a health care professional in a hospital or clinic setting. A special MedGuide will be given to you before each treatment. Be sure to read this information carefully each time. Talk to your pediatrician regarding the use of this medicine in children. Special care may be needed. Overdosage: If you think you've taken too much of this medicine contact a poison control center or emergency room at once. Overdosage: If you think you have taken too much of this  medicine contact a poison control center or emergency room at once. NOTE: This medicine is only for you. Do not share this medicine with others. What if I miss a dose? It is important not to miss your dose. Call your doctor or health care professional if you are unable to keep an appointment. What may interact with this medicine? Interactions have not been studied. This list may not describe all possible interactions. Give your health care provider a list of all the medicines, herbs, non-prescription drugs, or dietary supplements you use. Also tell them if you smoke, drink alcohol, or use illegal drugs. Some items may interact with your medicine. What should I watch for while using this medicine? Tell your doctor or healthcare professional if your  symptoms do not start to get better or if they get worse. Your condition will be monitored carefully while you are receiving this medicine. You may need blood work done while you are taking this medicine. What side effects may I notice from receiving this medicine? Side effects that you should report to your doctor or health care professional as soon as possible: -allergic reactions like skin rash, itching or hives, swelling of the face, lips, or tongue -black, tarry stools -bloody or watery diarrhea -changes in vision -chills -cough -depressed mood -eye pain -feeling anxious -fever -general ill feeling or flu-like symptoms -hair loss -loss of appetite -low blood counts - this medicine may decrease the number of white blood cells, red blood cells and platelets. You may be at increased risk for infections and bleeding -pain, tingling, numbness in the hands or feet -redness, blistering, peeling or loosening of the skin, including inside the mouth -red pinpoint spots on skin -signs of decreased platelets or bleeding - bruising, pinpoint red spots on the skin, black, tarry stools, blood in the urine -signs of decreased red blood cells - unusually weak or tired, feeling faint or lightheaded, falls -signs of infection - fever or chills, cough, sore throat, pain or trouble passing urine -signs and symptoms of a dangerous change in heartbeat or heart rhythm like chest pain; dizziness; fast or irregular heartbeat; palpitations; feeling faint or lightheaded, falls; breathing problems -signs and symptoms of high blood sugar such as dizziness; dry mouth; dry skin; fruity breath; nausea; stomach pain; increased hunger or thirst; increased urination -signs and symptoms of kidney injury like trouble passing urine or change in the amount of urine -signs and symptoms of liver injury like dark yellow or brown urine; general ill feeling or flu-like symptoms; light-colored stools; loss of appetite; nausea;  right upper belly pain; unusually weak or tired; yellowing of the eyes or skin -signs and symptoms of increased potassium like muscle weakness; chest pain; or fast, irregular heartbeat -signs and symptoms of low potassium like muscle cramps or muscle pain; chest pain; dizziness; feeling faint or lightheaded, falls; palpitations; breathing problems; or fast, irregular heartbeat -swelling of the ankles, feet, hands -weight gainSide effects that usually do not require medical attention (report to your doctor or health care professional if they continue or are bothersome): -constipation -general ill feeling or flu-like symptoms -hair loss -loss of appetite -nausea, vomiting This list may not describe all possible side effects. Call your doctor for medical advice about side effects. You may report side effects to FDA at 1-800-FDA-1088. Where should I keep my medicine? This drug is given in a hospital or clinic and will not be stored at home. NOTE: This sheet is a summary. It may not cover all possible information.  If you have questions about this medicine, talk to your doctor, pharmacist, or health care provider.  2015, Elsevier/Gold Standard. (2013-06-11 13:18:19)

## 2013-11-14 NOTE — Patient Instructions (Signed)
..[place] [facility] Discharge Instructions  RECOMMENDATIONS MADE BY THE CONSULTANT AND ANY TEST RESULTS WILL BE SENT TO YOUR REFERRING PHYSICIAN.  EXAM FINDINGS BY THE PHYSICIAN TODAY AND SIGNS OR SYMPTOMS TO REPORT TO CLINIC OR PRIMARY PHYSICIAN: Exam and pet scan findings as discussed by Dr. Barnet Glasgow. We will start a new treatment next week-- Nivolumab/opdivo We will do labs with first treatment INSTRUCTIONS/FOLLOW-UP: 2 weeks to see Dr. Barnet Glasgow  Thank you for choosing [facility] to provide your oncology and hematology care.  To afford each patient quality time with our providers, please arrive at least 15 minutes before your scheduled appointment time.  With your help, our goal is to use those 15 minutes to complete the necessary work-up to ensure our physicians have the information they need to help with your evaluation and healthcare recommendations.    Effective January 1st, 2014, we ask that you re-schedule your appointment with our physicians should you arrive 10 or more minutes late for your appointment.  We strive to give you quality time with our providers, and arriving late affects you and other patients whose appointments are after yours.    Again, thank you for choosing [facility].  Our hope is that these requests will decrease the amount of time that you wait before being seen by our physicians.       _____________________________________________________________  Should you have questions after your visit to [facility], please contact our office at [phone] between the hours of 8:30 a.m. and 4:30 p.m.  Voicemails left after 4:30 p.m. will not be returned until the following business day.  For prescription refill requests, have your pharmacy contact our office with your prescription refill request.    _______________________________________________________________  We hope that we have given you very good care.  You  may receive a patient satisfaction survey in the mail, please complete it and return it as soon as possible.  We value your feedback!  _______________________________________________________________  Have you asked about our STAR program?  STAR stands for Survivorship Training and Rehabilitation, and this is a nationally recognized cancer care program that focuses on survivorship and rehabilitation.  Cancer and cancer treatments may cause problems, such as, pain, making you feel tired and keeping you from doing the things that you need or want to do. Cancer rehabilitation can help. Our goal is to reduce these troubling effects and help you have the best quality of life possible.  You may receive a survey from a nurse that asks questions about your current state of health.  Based on the survey results, all eligible patients will be referred to the [facility] program for an evaluation so we can better serve you!  A frequently asked questions sheet is available upon request. Nivolumab injection What is this medicine? NIVOLUMAB (nye VOL ue mab) is used to treat certain types of melanoma and lung cancer. This medicine may be used for other purposes; ask your health care provider or pharmacist if you have questions. COMMON BRAND NAME(S): Opdivo What should I tell my health care provider before I take this medicine? They need to know if you have any of these conditions: -eye disease, vision problems -history of pancreatitis -immune system problems -inflammatory bowel disease -kidney disease -liver disease -lung disease -lupus -myasthenia gravis -multiple sclerosis -organ transplant -stomach or intestine problems -thyroid disease -tingling of the fingers or toes, or other nerve disorder -an unusual or allergic reaction to nivolumab, other medicines, foods, dyes,  or preservatives -pregnant or trying to get pregnant -breast-feeding How should I use this medicine? This medicine is for infusion into  a vein. It is given by a health care professional in a hospital or clinic setting. A special MedGuide will be given to you before each treatment. Be sure to read this information carefully each time. Talk to your pediatrician regarding the use of this medicine in children. Special care may be needed. Overdosage: If you think you've taken too much of this medicine contact a poison control center or emergency room at once. Overdosage: If you think you have taken too much of this medicine contact a poison control center or emergency room at once. NOTE: This medicine is only for you. Do not share this medicine with others. What if I miss a dose? It is important not to miss your dose. Call your doctor or health care professional if you are unable to keep an appointment. What may interact with this medicine? Interactions have not been studied. This list may not describe all possible interactions. Give your health care provider a list of all the medicines, herbs, non-prescription drugs, or dietary supplements you use. Also tell them if you smoke, drink alcohol, or use illegal drugs. Some items may interact with your medicine. What should I watch for while using this medicine? Tell your doctor or healthcare professional if your symptoms do not start to get better or if they get worse. Your condition will be monitored carefully while you are receiving this medicine. You may need blood work done while you are taking this medicine. What side effects may I notice from receiving this medicine? Side effects that you should report to your doctor or health care professional as soon as possible: -allergic reactions like skin rash, itching or hives, swelling of the face, lips, or tongue -black, tarry stools -bloody or watery diarrhea -changes in vision -chills -cough -depressed mood -eye pain -feeling anxious -fever -general ill feeling or flu-like symptoms -hair loss -loss of appetite -low blood counts -  this medicine may decrease the number of white blood cells, red blood cells and platelets. You may be at increased risk for infections and bleeding -pain, tingling, numbness in the hands or feet -redness, blistering, peeling or loosening of the skin, including inside the mouth -red pinpoint spots on skin -signs of decreased platelets or bleeding - bruising, pinpoint red spots on the skin, black, tarry stools, blood in the urine -signs of decreased red blood cells - unusually weak or tired, feeling faint or lightheaded, falls -signs of infection - fever or chills, cough, sore throat, pain or trouble passing urine -signs and symptoms of a dangerous change in heartbeat or heart rhythm like chest pain; dizziness; fast or irregular heartbeat; palpitations; feeling faint or lightheaded, falls; breathing problems -signs and symptoms of high blood sugar such as dizziness; dry mouth; dry skin; fruity breath; nausea; stomach pain; increased hunger or thirst; increased urination -signs and symptoms of kidney injury like trouble passing urine or change in the amount of urine -signs and symptoms of liver injury like dark yellow or brown urine; general ill feeling or flu-like symptoms; light-colored stools; loss of appetite; nausea; right upper belly pain; unusually weak or tired; yellowing of the eyes or skin -signs and symptoms of increased potassium like muscle weakness; chest pain; or fast, irregular heartbeat -signs and symptoms of low potassium like muscle cramps or muscle pain; chest pain; dizziness; feeling faint or lightheaded, falls; palpitations; breathing problems; or fast, irregular heartbeat -  swelling of the ankles, feet, hands -weight gainSide effects that usually do not require medical attention (report to your doctor or health care professional if they continue or are bothersome): -constipation -general ill feeling or flu-like symptoms -hair loss -loss of appetite -nausea, vomiting This list  may not describe all possible side effects. Call your doctor for medical advice about side effects. You may report side effects to FDA at 1-800-FDA-1088. Where should I keep my medicine? This drug is given in a hospital or clinic and will not be stored at home. NOTE: This sheet is a summary. It may not cover all possible information. If you have questions about this medicine, talk to your doctor, pharmacist, or health care provider.  2015, Elsevier/Gold Standard. (2013-06-11 13:18:19)

## 2013-11-15 ENCOUNTER — Encounter (HOSPITAL_COMMUNITY): Payer: Managed Care, Other (non HMO)

## 2013-11-15 ENCOUNTER — Encounter (HOSPITAL_COMMUNITY): Payer: Self-pay | Admitting: *Deleted

## 2013-11-21 ENCOUNTER — Inpatient Hospital Stay (HOSPITAL_COMMUNITY): Payer: Managed Care, Other (non HMO)

## 2013-11-22 ENCOUNTER — Telehealth (HOSPITAL_COMMUNITY): Payer: Self-pay | Admitting: Hematology and Oncology

## 2013-11-22 ENCOUNTER — Encounter (HOSPITAL_BASED_OUTPATIENT_CLINIC_OR_DEPARTMENT_OTHER): Payer: Managed Care, Other (non HMO)

## 2013-11-22 VITALS — BP 95/56 | HR 96 | Temp 98.3°F | Resp 18 | Wt 106.0 lb

## 2013-11-22 DIAGNOSIS — C3400 Malignant neoplasm of unspecified main bronchus: Secondary | ICD-10-CM | POA: Diagnosis present

## 2013-11-22 DIAGNOSIS — C771 Secondary and unspecified malignant neoplasm of intrathoracic lymph nodes: Secondary | ICD-10-CM

## 2013-11-22 DIAGNOSIS — Z5112 Encounter for antineoplastic immunotherapy: Secondary | ICD-10-CM

## 2013-11-22 DIAGNOSIS — C7800 Secondary malignant neoplasm of unspecified lung: Secondary | ICD-10-CM

## 2013-11-22 DIAGNOSIS — C3410 Malignant neoplasm of upper lobe, unspecified bronchus or lung: Secondary | ICD-10-CM

## 2013-11-22 LAB — COMPREHENSIVE METABOLIC PANEL
ALT: 6 U/L (ref 0–35)
AST: 11 U/L (ref 0–37)
Albumin: 3 g/dL — ABNORMAL LOW (ref 3.5–5.2)
Alkaline Phosphatase: 95 U/L (ref 39–117)
Anion gap: 15 (ref 5–15)
BUN: 12 mg/dL (ref 6–23)
CO2: 21 mEq/L (ref 19–32)
Calcium: 9.1 mg/dL (ref 8.4–10.5)
Chloride: 103 mEq/L (ref 96–112)
Creatinine, Ser: 0.72 mg/dL (ref 0.50–1.10)
GFR calc Af Amer: >90 mL/min (ref >90)
GFR calc non Af Amer: >90 mL/min (ref >90)
Glucose, Bld: 103 mg/dL — ABNORMAL HIGH (ref 70–99)
Potassium: 3.9 mEq/L (ref 3.7–5.3)
Sodium: 139 mEq/L (ref 137–147)
Total Bilirubin: <0.2 mg/dL — ABNORMAL LOW (ref 0.3–1.2)
Total Protein: 7.2 g/dL (ref 6.0–8.3)

## 2013-11-22 LAB — CBC WITH DIFFERENTIAL/PLATELET
BASOS ABS: 0 10*3/uL (ref 0.0–0.1)
Basophils Relative: 0 % (ref 0–1)
EOS ABS: 0 10*3/uL (ref 0.0–0.7)
EOS PCT: 1 % (ref 0–5)
HCT: 31.3 % — ABNORMAL LOW (ref 36.0–46.0)
Hemoglobin: 10 g/dL — ABNORMAL LOW (ref 12.0–15.0)
Lymphocytes Relative: 11 % — ABNORMAL LOW (ref 12–46)
Lymphs Abs: 0.6 10*3/uL — ABNORMAL LOW (ref 0.7–4.0)
MCH: 32.6 pg (ref 26.0–34.0)
MCHC: 31.9 g/dL (ref 30.0–36.0)
MCV: 102 fL — AB (ref 78.0–100.0)
Monocytes Absolute: 0.5 10*3/uL (ref 0.1–1.0)
Monocytes Relative: 10 % (ref 3–12)
NEUTROS PCT: 78 % — AB (ref 43–77)
Neutro Abs: 4 10*3/uL (ref 1.7–7.7)
Platelets: 215 10*3/uL (ref 150–400)
RBC: 3.07 MIL/uL — ABNORMAL LOW (ref 3.87–5.11)
RDW: 15.8 % — AB (ref 11.5–15.5)
WBC: 5.2 10*3/uL (ref 4.0–10.5)

## 2013-11-22 LAB — LACTATE DEHYDROGENASE: LDH: 158 U/L (ref 94–250)

## 2013-11-22 LAB — TSH: TSH: 0.791 u[IU]/mL (ref 0.350–4.500)

## 2013-11-22 MED ORDER — SODIUM CHLORIDE 0.9 % IV SOLN
Freq: Once | INTRAVENOUS | Status: AC
  Administered 2013-11-22: 10:00:00 via INTRAVENOUS

## 2013-11-22 MED ORDER — HEPARIN SOD (PORK) LOCK FLUSH 100 UNIT/ML IV SOLN
500.0000 [IU] | Freq: Once | INTRAVENOUS | Status: AC | PRN
  Administered 2013-11-22: 500 [IU]
  Filled 2013-11-22: qty 5

## 2013-11-22 MED ORDER — SODIUM CHLORIDE 0.9 % IJ SOLN
10.0000 mL | INTRAMUSCULAR | Status: DC | PRN
  Administered 2013-11-22: 10 mL

## 2013-11-22 MED ORDER — SODIUM CHLORIDE 0.9 % IV SOLN
3.0000 mg/kg | Freq: Once | INTRAVENOUS | Status: AC
  Administered 2013-11-22: 150 mg via INTRAVENOUS
  Filled 2013-11-22: qty 15

## 2013-11-22 NOTE — Progress Notes (Signed)
Tolerated treatment well.

## 2013-11-22 NOTE — Patient Instructions (Signed)
..  [facility] Discharge Instructions for Patients Receiving Chemotherapy  Today you received the following chemotherapy agents Opdivo If you develop nausea and vomiting, or diarrhea that is not controlled by your medication, call the clinic. Teaching materials and schedule given to patient per Isidoro Donning RN The clinic phone number is [phone]. Office hours are Monday-Friday 8:30am-5:00pm.  BELOW ARE SYMPTOMS THAT SHOULD BE REPORTED IMMEDIATELY:  *FEVER GREATER THAN 101.0 F  *CHILLS WITH OR WITHOUT FEVER  NAUSEA AND VOMITING THAT IS NOT CONTROLLED WITH YOUR NAUSEA MEDICATION  *UNUSUAL SHORTNESS OF BREATH  *UNUSUAL BRUISING OR BLEEDING  TENDERNESS IN MOUTH AND THROAT WITH OR WITHOUT PRESENCE OF ULCERS  *URINARY PROBLEMS  *BOWEL PROBLEMS  UNUSUAL RASH Items with * indicate a potential emergency and should be followed up as soon as possible. If you have an emergency after office hours please contact your primary care physician or go to the nearest emergency department.  Please call the clinic during office hours if you have any questions or concerns.   You may also contact the Patient Navigator at [phone] should you have any questions or need assistance in obtaining follow up care. _____________________________________________________________________ Have you asked about our STAR program?    STAR stands for Survivorship Training and Rehabilitation, and this is a nationally recognized cancer care program that focuses on survivorship and rehabilitation.  Cancer and cancer treatments may cause problems, such as, pain, making you feel tired and keeping you from doing the things that you need or want to do. Cancer rehabilitation can help. Our goal is to reduce these troubling effects and help you have the best quality of life possible.  You may receive a survey from a nurse that asks questions about your current state of health.  Based on the survey results, all  eligible patients will be referred to the [facility] program for an evaluation so we can better serve you! A frequently asked questions sheet is available upon request.

## 2013-11-22 NOTE — Telephone Encounter (Signed)
PER [employer] OPDIVO DOES NOT [place] WITH CHRISTINA CSR

## 2013-11-23 ENCOUNTER — Telehealth (HOSPITAL_COMMUNITY): Payer: Self-pay | Admitting: *Deleted

## 2013-11-23 NOTE — Telephone Encounter (Signed)
Contacted [facility] for 24 hour chemotherapy follow-up.  Ms. Bachtel received her first dose of nivolumab yesterday.  She reports she is doing "pretty good".  C/o nausea, but states this is normal for her with onset prior to receiving this medication, and says that her nausea is controlled with Compazine and Reglan.

## 2013-11-26 ENCOUNTER — Other Ambulatory Visit (HOSPITAL_COMMUNITY): Payer: Managed Care, Other (non HMO)

## 2013-11-26 ENCOUNTER — Ambulatory Visit (HOSPITAL_COMMUNITY): Payer: Managed Care, Other (non HMO)

## 2013-11-28 ENCOUNTER — Encounter (HOSPITAL_BASED_OUTPATIENT_CLINIC_OR_DEPARTMENT_OTHER): Payer: Managed Care, Other (non HMO)

## 2013-11-28 ENCOUNTER — Other Ambulatory Visit (HOSPITAL_COMMUNITY): Payer: Self-pay | Admitting: Hematology and Oncology

## 2013-11-28 ENCOUNTER — Encounter (HOSPITAL_COMMUNITY): Payer: Self-pay

## 2013-11-28 VITALS — BP 105/63 | HR 102 | Temp 97.9°F | Resp 20 | Wt 105.9 lb

## 2013-11-28 DIAGNOSIS — C771 Secondary and unspecified malignant neoplasm of intrathoracic lymph nodes: Secondary | ICD-10-CM

## 2013-11-28 DIAGNOSIS — C3410 Malignant neoplasm of upper lobe, unspecified bronchus or lung: Secondary | ICD-10-CM

## 2013-11-28 DIAGNOSIS — K219 Gastro-esophageal reflux disease without esophagitis: Secondary | ICD-10-CM

## 2013-11-28 DIAGNOSIS — C7802 Secondary malignant neoplasm of left lung: Secondary | ICD-10-CM

## 2013-11-28 DIAGNOSIS — C7800 Secondary malignant neoplasm of unspecified lung: Secondary | ICD-10-CM

## 2013-11-28 DIAGNOSIS — C7801 Secondary malignant neoplasm of right lung: Secondary | ICD-10-CM

## 2013-11-28 MED ORDER — DRONABINOL 2.5 MG PO CAPS: 2.5000 mg | ORAL_CAPSULE | Freq: Two times a day (BID) | ORAL | Status: AC

## 2013-11-28 MED ORDER — IPRATROPIUM-ALBUTEROL 20-100 MCG/ACT IN AERS: 1.0000 | INHALATION_SPRAY | Freq: Four times a day (QID) | RESPIRATORY_TRACT | Status: AC

## 2013-11-28 NOTE — Progress Notes (Signed)
STAR Program Physical Impairment and Functional Assessment Screening Tool  1. Are you having any pain, including headaches, joint pain, or muscle pain (upper body = OT; lower body = PT)?  Yes, but I hand this before my cancer diagnosis.  2. Do your hands and/or feet feel numb or tingle (PT)?  Yes, but I hand this before my cancer diagnosis.  3. Does any part of your body feel swollen or larger than usual (upper body = OT; lower body = PT)?  No  4. Are you so tired that you cannot do the things you want or need to do (PT or OT)?  Yes, this started after my diagnosis and is still a problem.  5. Are you feeling weak or are you having trouble moving any part of your body (PT/OT)?  Yes, this started after my diagnosis and is still a problem.  6. Are you having trouble concentrating, thinking, or remembering things (OT/ST)?  Yes, this started after my diagnosis and is still a problem.  7. Are you having trouble moving around or feel like you might trip or fall (PT)?  Yes, this started after my diagnosis and is still a problem.  8. Are you having trouble swallowing (ST)?  No  9. Are you having trouble speaking (ST)?  No  10. Are you having trouble with going or getting to the bathroom (OT)?  Yes, but I hand this before my cancer diagnosis.  11. Are you having trouble with your sexual function (OT)?  No  12. Are you having trouble lifting things, even just your arms (OT/PT)?  Yes, this started after my diagnosis and is still a problem.  19. Are you having trouble taking care of yourself as in dressing or bathing (OT)?  No  14. Are you having trouble with daily tasks like chores or shopping (OT)?  Yes, but I hand this before my cancer diagnosis.  15. Are you having trouble driving (OT)?  Yes, this started after my diagnosis and is still a problem.  100. Are you having trouble returning to work or completing your tasks at work (OT)?  Yes, this started after my  diagnosis and is still a problem.  Other concerns:  Unstable walking esp in the morning  Legend: OT = Occupational Therapy PT = Physical Therapy ST = Speech Therapy

## 2013-11-28 NOTE — Progress Notes (Signed)
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 Metastatic squamous cell lung cancer    CURRENT THERAPY: Nivolumab 3 mg per kilogram IV every 2 weeks beginning on 11/22/2013  INTERVAL HISTORY: Heather Coffey 53 y.o. female returns for followup after initiation of Nivolumab therapy for progressive squamous cell carcinoma along with lung metastases. She had been treated previously for stage IIIB disease with carboplatin/Taxol/radiotherapy but recent CT scan and PET scan show evidence of progression in the chest and therefore level that treatment was instituted. Her last chemotherapy was on 07/04/2013 after 6 cycles with radiotherapy completed on 07/18/2013. She has experienced dizziness with tremulousness while taking Compazine for nausea. She denies any fever, night sweats, but did have headache for 2 days for which she took Tylenol with relief. She's also had episodes of cough without expectoration or hemoptysis. She denies any chest pain, PND, orthopnea, palpitations, vomiting, diarrhea, skin rash, blurred or double vision, skin rash, or seizures.  MEDICAL HISTORY: Past Medical History  Diagnosis Date  . Arthritis   . Anxiety   . Hyperlipidemia   . Tobacco abuse   . Shortness of breath     Hx: of breath with exertion  . Depression   . GERD (gastroesophageal reflux disease)   . H/O hiatal hernia   . 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    ALLERGIES:  is allergic to augmentin; codeine; and tetracyclines & related.  MEDICATIONS: has a current medication list which includes the following prescription(s): alprazolam, cyclobenzaprine, first-dukes mouthwash, dronabinol, esomeprazole, fluoxetine, fluticasone, fluticasone-salmeterol, ibuprofen, ipratropium-albuterol, lidocaine-prilocaine, loratadine-pseudoephedrine, metoclopramide, montelukast, multivitamin, nivolumab, pravastatin, prednisone, prochlorperazine, sucralfate, and tramadol, and the following Facility-Administered Medications: sodium chloride.  SURGICAL HISTORY:  Past Surgical History  Procedure Laterality Date  . Tubal ligation    . Video bronchoscopy with endobronchial ultrasound N/A 05/03/2013    Procedure: VIDEO BRONCHOSCOPY WITH ENDOBRONCHIAL ULTRASOUND;  Surgeon: Melrose Nakayama, MD;  Location: [facility];  Service: Thoracic;  Laterality: N/A;  . Nasal septum surgery    . Portacath placement Left 05/21/2013    Procedure: INSERTION PORT-A-CATH;  Surgeon: Melrose Nakayama, MD;  Location: [facility];  Service: Thoracic;  Laterality: Left;  . 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 schatzki's ring, distal one half the tubular esophagus severely inflamed. hiatal hernia bx= squamous mucosa with acute ulcer, candida  . Maloney dilation N/A 09/13/2013    Procedure: Venia Minks DILATION;  Surgeon: Daneil Dolin, MD;  [facility];  Service: Endoscopy;  Laterality: N/A;  . Savory dilation N/A 09/13/2013    Procedure: SAVORY DILATION;  Surgeon: Daneil Dolin, MD;  [facility];  Service: Endoscopy;  Laterality: N/A;  . Breast lumpectomy Right     Hx: of  . Vaginal hysterectomy      still has ovaries per pt  . Port a cath revision Left 10/11/2013    Procedure: LEFT PORT A CATH REVISION;  Surgeon: Remo Lipps  Chaya Jan, MD;  Location: [facility];  Service:  Thoracic;  Laterality: Left;  LOCAL ANETHESIA PER DR. HENDRICKSON    FAMILY HISTORY: family history includes COPD in her brother, father, mother, and sister; Cancer in her sister; Diabetes in her sister; Hypercholesterolemia in her mother. There is no history of Colon cancer.  SOCIAL HISTORY:  reports that she has been smoking Cigarettes.  She has a 8.75 pack-year smoking history. She has never used smokeless tobacco. She reports that she does not drink alcohol or use illicit drugs.  REVIEW OF SYSTEMS:  Other than that discussed above is noncontributory.  PHYSICAL EXAMINATION: ECOG PERFORMANCE STATUS: 1 - Symptomatic but completely ambulatory  Blood pressure 105/63, pulse 102, temperature 97.9 F (36.6 C), temperature source Oral, resp. rate 20, weight 105 lb 14.4 oz (48.036 kg), SpO2 100.00%.  GENERAL:alert, no distress and comfortable SKIN: skin color, texture, turgor are normal, no rashes or significant lesions EYES: PERLA; Conjunctiva are pink and non-injected, sclera clear SINUSES: No redness or tenderness over maxillary or ethmoid sinuses OROPHARYNX:no exudate, no erythema on lips, buccal mucosa, or tongue. NECK: supple, thyroid normal size, non-tender, without nodularity. No masses CHEST: Increased AP diameter with light port in place. LYMPH:  no palpable lymphadenopathy in the cervical, axillary or inguinal LUNGS: clear to auscultation and percussion with normal breathing effort HEART: regular rate & rhythm and no murmurs. ABDOMEN:abdomen soft, non-tender and normal bowel sounds MUSCULOSKELETAL:no cyanosis of digits and no clubbing. Range of motion normal.  NEURO: alert & oriented x 3 with fluent speech, no focal motor/sensory deficits. Minimal tremulousness at rest.   LABORATORY DATA: Infusion on 11/22/2013  Component Date Value Ref Range Status  . WBC 11/22/2013 5.2  4.0 - 10.5 K/uL Final  . RBC 11/22/2013 3.07* 3.87 - 5.11 MIL/uL Final  . Hemoglobin 11/22/2013 10.0* 12.0  - 15.0 g/dL Final  . HCT 11/22/2013 31.3* 36.0 - 46.0 % Final  . MCV 11/22/2013 102.0* 78.0 - 100.0 fL Final  . MCH 11/22/2013 32.6  26.0 - 34.0 pg Final  . MCHC 11/22/2013 31.9  30.0 - 36.0 g/dL Final  . RDW 11/22/2013 15.8* 11.5 - 15.5 % Final  . Platelets 11/22/2013 215  150 - 400 K/uL Final  . Neutrophils Relative % 11/22/2013 78* 43 - 77 % Final  . Neutro Abs 11/22/2013 4.0  1.7 - 7.7 K/uL Final  . Lymphocytes Relative 11/22/2013 11* 12 - 46 % Final  . Lymphs Abs 11/22/2013 0.6* 0.7 - 4.0 K/uL Final  . Monocytes Relative 11/22/2013 10  3 - 12 % Final  . Monocytes Absolute 11/22/2013 0.5  0.1 - 1.0 K/uL Final  . Eosinophils Relative 11/22/2013 1  0 - 5 % Final  . Eosinophils Absolute 11/22/2013 0.0  0.0 - 0.7 K/uL Final  . Basophils Relative 11/22/2013 0  0 - 1 % Final  . Basophils Absolute 11/22/2013 0.0  0.0 - 0.1 K/uL Final  . Sodium 11/22/2013 139  137 - 147 mEq/L Final  . Potassium 11/22/2013 3.9  3.7 - 5.3 mEq/L Final  . Chloride 11/22/2013 103  96 - 112 mEq/L Final  . CO2 11/22/2013 21  19 - 32 mEq/L Final  . Glucose, Bld 11/22/2013 103* 70 - 99 mg/dL Final  . BUN 11/22/2013 12  6 - 23 mg/dL Final  . Creatinine, Ser 11/22/2013 0.72  0.50 - 1.10 mg/dL Final  . Calcium 11/22/2013 9.1  8.4 - 10.5 mg/dL Final  . Total Protein 11/22/2013 7.2  6.0 - 8.3 g/dL  Final  . Albumin 11/22/2013 3.0* 3.5 - 5.2 g/dL Final  . AST 11/22/2013 11  0 - 37 U/L Final  . ALT 11/22/2013 6  0 - 35 U/L Final  . Alkaline Phosphatase 11/22/2013 95  39 - 117 U/L Final  . Total Bilirubin 11/22/2013 <0.2* 0.3 - 1.2 mg/dL Final  . GFR calc non Af Amer 11/22/2013 >90  >90 mL/min Final  . GFR calc Af Amer 11/22/2013 >90  >90 mL/min Final   Comment: (NOTE)                          The eGFR has been calculated using the CKD EPI equation.                          This calculation has not been validated in all clinical situations.                          eGFR's persistently <90 mL/min signify possible  Chronic Kidney                          Disease.  . Anion gap 11/22/2013 15  5 - 15 Final  . LDH 11/22/2013 158  94 - 250 U/L Final  . TSH 11/22/2013 0.791  0.350 - 4.500 uIU/mL Final   Performed at [facility] Outpatient Visit on 11/09/2013  Component Date Value Ref Range Status  . Glucose-Capillary 11/09/2013 78  70 - 99 mg/dL Final  Office Visit on 11/02/2013  Component Date Value Ref Range Status  . WBC 11/02/2013 4.1  4.0 - 10.5 K/uL Final  . RBC 11/02/2013 3.49* 3.87 - 5.11 MIL/uL Final  . Hemoglobin 11/02/2013 11.7* 12.0 - 15.0 g/dL Final  . HCT 11/02/2013 36.7  36.0 - 46.0 % Final  . MCV 11/02/2013 105.2* 78.0 - 100.0 fL Final  . MCH 11/02/2013 33.5  26.0 - 34.0 pg Final  . MCHC 11/02/2013 31.9  30.0 - 36.0 g/dL Final  . RDW 11/02/2013 16.1* 11.5 - 15.5 % Final  . Platelets 11/02/2013 279  150 - 400 K/uL Final  . Neutrophils Relative % 11/02/2013 65  43 - 77 % Final  . Neutro Abs 11/02/2013 2.7  1.7 - 7.7 K/uL Final  . Lymphocytes Relative 11/02/2013 20  12 - 46 % Final  . Lymphs Abs 11/02/2013 0.8  0.7 - 4.0 K/uL Final  . Monocytes Relative 11/02/2013 12  3 - 12 % Final  . Monocytes Absolute 11/02/2013 0.5  0.1 - 1.0 K/uL Final  . Eosinophils Relative 11/02/2013 2  0 - 5 % Final  . Eosinophils Absolute 11/02/2013 0.1  0.0 - 0.7 K/uL Final  . Basophils Relative 11/02/2013 1  0 - 1 % Final  . Basophils Absolute 11/02/2013 0.0  0.0 - 0.1 K/uL Final  . Sodium 11/02/2013 143  137 - 147 mEq/L Final  . Potassium 11/02/2013 3.8  3.7 - 5.3 mEq/L Final  . Chloride 11/02/2013 104  96 - 112 mEq/L Final  . CO2 11/02/2013 27  19 - 32 mEq/L Final  . Glucose, Bld 11/02/2013 103* 70 - 99 mg/dL Final  . BUN 11/02/2013 9  6 - 23 mg/dL Final  . Creatinine, Ser 11/02/2013 0.87  0.50 - 1.10 mg/dL Final  . Calcium 11/02/2013 9.3  8.4 - 10.5 mg/dL Final  . Total Protein  11/02/2013 7.1  6.0 - 8.3 g/dL Final  . Albumin 11/02/2013 3.1* 3.5 - 5.2 g/dL Final  . AST 11/02/2013 9   0 - 37 U/L Final  . ALT 11/02/2013 8  0 - 35 U/L Final  . Alkaline Phosphatase 11/02/2013 99  39 - 117 U/L Final  . Total Bilirubin 11/02/2013 <0.2* 0.3 - 1.2 mg/dL Final  . GFR calc non Af Amer 11/02/2013 76* >90 mL/min Final  . GFR calc Af Amer 11/02/2013 88* >90 mL/min Final   Comment: (NOTE)                          The eGFR has been calculated using the CKD EPI equation.                          This calculation has not been validated in all clinical situations.                          eGFR's persistently <90 mL/min signify possible Chronic Kidney                          Disease.  . Anion gap 11/02/2013 12  5 - 15 Final  . LDH 11/02/2013 167  94 - 250 U/L Final    PATHOLOGY: No new pathology. Squamous cell carcinoma lung  Urinalysis No results found for this basename: colorurine,  appearanceur,  labspec,  phurine,  glucoseu,  hgbur,  bilirubinur,  ketonesur,  proteinur,  urobilinogen,  nitrite,  leukocytesur    RADIOGRAPHIC STUDIES: Ct Chest W Contrast  10/30/2013   CLINICAL DATA:  Restaging lung cancer.  EXAM: CT CHEST, ABDOMEN, AND PELVIS WITH CONTRAST  TECHNIQUE: Multidetector CT imaging of the chest, abdomen and pelvis was performed following the standard protocol during bolus administration of intravenous contrast.  CONTRAST:  111m OMNIPAQUE IOHEXOL 300 MG/ML  SOLN  COMPARISON:  08/16/2013  FINDINGS: CT CHEST FINDINGS  There is no pleural effusion identified. Cavitary lesion within the central left upper lobe measures 1.8 x 3.9 cm. Previously this measured 1.8 x 3.1 cm. There is been increase an postobstructive consolidation and atelectasis of the left upper lobe. Scattered, peripheral nodules within the left upper lobe are identified and appear peribronchovascular and are favored to represent sequelae of either aspiration, infection or inflammation. Nodular density in the right upper lobe is stable measuring 1 cm, image 27/series 3. Bronchiectasis and cystic change within the  perihilar right upper lobe is stable from previous exam, image 24/series 3.  The heart size appears normal. There is no pericardial effusion. Calcified atherosclerotic disease involves the thoracic aorta. Within the AP window region there is a focal area of soft tissue measuring 4.1 x 0.8 cm, image 25/series 2. Previously this measured 3 x 0.7 cm. No enlarged mediastinal or hilar lymph nodes. There is no axillary or supraclavicular adenopathy.  CT ABDOMEN AND PELVIS FINDINGS  No liver abnormality identified. Calcification involving the wall of the gallbladder is again noted. There are no gallstones or gallbladder wall thickening. No pericholecystic fluid. No biliary dilatation. The pancreas is normal. This spleen is unremarkable. The adrenal glands are both normal. Normal appearance of both kidneys. Urinary bladder is normal. Previous hysterectomy.  Calcified atherosclerotic disease involves the abdominal aorta. No aneurysm. No upper abdominal adenopathy. There is no pelvic or inguinal adenopathy.  Review of the visualized  bony structures is significant for mild degenerative disc disease. There is no aggressive lytic or sclerotic bone lesion noted.  IMPRESSION: 1. Cavitary lesion within the left perihilar region appears increased in size from previous exam. There is also thickening of the wall of this lesion. Associated increased in postobstructive atelectasis and consolidation is also identified. Cannot rule out recurrence of tumor. Further evaluation with PET-CT is advised. 2. Slight increase an nonspecific soft tissue within the AP window region of the left side of mediastinum. Attention to this area on followup PET-CT would be advised. 3. Calcification within the wall of the gallbladder is again noted.   Electronically Signed   By: Kerby Moors M.D.   On: 10/30/2013 15:29   Ct Abdomen Pelvis W Contrast  10/30/2013   CLINICAL DATA:  Restaging lung cancer.  EXAM: CT CHEST, ABDOMEN, AND PELVIS WITH CONTRAST   TECHNIQUE: Multidetector CT imaging of the chest, abdomen and pelvis was performed following the standard protocol during bolus administration of intravenous contrast.  CONTRAST:  145m OMNIPAQUE IOHEXOL 300 MG/ML  SOLN  COMPARISON:  08/16/2013  FINDINGS: CT CHEST FINDINGS  There is no pleural effusion identified. Cavitary lesion within the central left upper lobe measures 1.8 x 3.9 cm. Previously this measured 1.8 x 3.1 cm. There is been increase an postobstructive consolidation and atelectasis of the left upper lobe. Scattered, peripheral nodules within the left upper lobe are identified and appear peribronchovascular and are favored to represent sequelae of either aspiration, infection or inflammation. Nodular density in the right upper lobe is stable measuring 1 cm, image 27/series 3. Bronchiectasis and cystic change within the perihilar right upper lobe is stable from previous exam, image 24/series 3.  The heart size appears normal. There is no pericardial effusion. Calcified atherosclerotic disease involves the thoracic aorta. Within the AP window region there is a focal area of soft tissue measuring 4.1 x 0.8 cm, image 25/series 2. Previously this measured 3 x 0.7 cm. No enlarged mediastinal or hilar lymph nodes. There is no axillary or supraclavicular adenopathy.  CT ABDOMEN AND PELVIS FINDINGS  No liver abnormality identified. Calcification involving the wall of the gallbladder is again noted. There are no gallstones or gallbladder wall thickening. No pericholecystic fluid. No biliary dilatation. The pancreas is normal. This spleen is unremarkable. The adrenal glands are both normal. Normal appearance of both kidneys. Urinary bladder is normal. Previous hysterectomy.  Calcified atherosclerotic disease involves the abdominal aorta. No aneurysm. No upper abdominal adenopathy. There is no pelvic or inguinal adenopathy.  Review of the visualized bony structures is significant for mild degenerative disc disease.  There is no aggressive lytic or sclerotic bone lesion noted.  IMPRESSION: 1. Cavitary lesion within the left perihilar region appears increased in size from previous exam. There is also thickening of the wall of this lesion. Associated increased in postobstructive atelectasis and consolidation is also identified. Cannot rule out recurrence of tumor. Further evaluation with PET-CT is advised. 2. Slight increase an nonspecific soft tissue within the AP window region of the left side of mediastinum. Attention to this area on followup PET-CT would be advised. 3. Calcification within the wall of the gallbladder is again noted.   Electronically Signed   By: TKerby MoorsM.D.   On: 10/30/2013 15:29   Nm Pet Image Restag (ps) Skull Base To Thigh  11/09/2013   CLINICAL DATA:  Subsequent treatment strategy for Lung cancer.  EXAM: NUCLEAR MEDICINE PET SKULL BASE TO THIGH  TECHNIQUE: 6.3 mCi F-18 FDG  was injected intravenously. Full-ring PET imaging was performed from the skull base to thigh after the radiotracer. CT data was obtained and used for attenuation correction and anatomic localization.  FASTING BLOOD GLUCOSE:  Value: 78 mg/dl  COMPARISON:  05/01/2013 an CT chest 10/30/2013  FINDINGS: NECK  Enlarged left supraclavicular lymph node measures 1 cm and has an SUV max equal to 16.9. Previously this measured 6 mm and had an SUV max equal to 2.5.  CHEST  Interval resolution of previous hypermetabolic left hilar/AP window mass compared with PET-CT from 05/01/2013. No significant FDG uptake within the AP window region identified. Left upper lobe perihilar cavitary lesion exhibits malignant range FDG uptake. The SUV max within this area is equal to 4.4. Scattered pulmonary nodules within the left upper lobe are identified and exhibit increased FDG uptake. Index nodule within the anterior left upper lobe measures 8 mm and has an SUV max equal to 2.8.  ABDOMEN/PELVIS  No abnormal hypermetabolic activity within the liver,  pancreas, adrenal glands, or spleen. Calcification within the wall of the gallbladder is again noted. No hypermetabolic lymph nodes in the abdomen or pelvis. Calcified atherosclerotic disease involves the abdominal aorta.  SKELETON  No focal hypermetabolic activity to suggest skeletal metastasis.  IMPRESSION: 1. Examination is positive for recurrent tumor. 2. Enlarged left supraclavicular lymph node exhibits malignant range FDG uptake compatible with metastatic adenopathy. 3. Malignant range FDG uptake is associated with cavitary lesion within the perihilar left upper lobe compatible with local tumor recurrence. 4. Scattered irregular nodular densities in the left upper lobe also exhibit malignant range FDG uptake which may be postinflammatory or represent areas of metastasis.   Electronically Signed   By: Kerby Moors M.D.   On: 11/09/2013 13:01    ASSESSMENT:  #1. 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  2. Chronic obstructive pulmonary disease, unfortunately still smoking.  3. History of right lung abscess, status post medical treatment only occurring about 3 or 4 years ago requiring hospitalization and intravenous antibiotics with no antecedent history of aspiration or loss of consciousness.  4. Gastroesophageal reflux disease, controlled, status post EGD with dilatation of the stricture.  5. 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  6. Leukopenia and thrombocytopenia from previous chemotherapy. 7. Tremulousness secondary to Compazine.    PLAN:  #1. Discontinue Compazine. #2. Marinol 2.5 mg daily to control nausea and increased appetite. #3. Combivent inhaler up to every 4 hours for breakthrough cough or wheezing. #4. Continue Advair inhaler twice a day. #5. Nivolumab IV in one week with followup in 3 weeks.  All questions were answered. The patient knows to call the clinic with  any problems, questions or concerns. We can certainly see the patient much sooner if necessary.   I spent 30 minutes counseling the patient face to face. The total time spent in the appointment was 40 minutes.    Doroteo Bradford, MD 11/28/2013 11:29 AM  DISCLAIMER:  This note was dictated with voice recognition software.  Similar sounding words can inadvertently be transcribed inaccurately and may not be corrected upon review.

## 2013-11-28 NOTE — Patient Instructions (Signed)
[facility] Discharge Instructions  RECOMMENDATIONS MADE BY THE CONSULTANT AND ANY TEST RESULTS WILL BE SENT TO YOUR REFERRING PHYSICIAN.  EXAM FINDINGS BY THE PHYSICIAN TODAY AND SIGNS OR SYMPTOMS TO REPORT TO CLINIC OR PRIMARY PHYSICIAN: You saw dr Barnet Glasgow today  Stop taking compazine and reglan.  Take the new prescription for nausea.  Use the new inhaler for break through cough or wheezing.   Thank you for choosing [facility] to provide your oncology and hematology care.  To afford each patient quality time with our providers, please arrive at least 15 minutes before your scheduled appointment time.  With your help, our goal is to use those 15 minutes to complete the necessary work-up to ensure our physicians have the information they need to help with your evaluation and healthcare recommendations.    Effective January 1st, 2014, we ask that you re-schedule your appointment with our physicians should you arrive 10 or more minutes late for your appointment.  We strive to give you quality time with our providers, and arriving late affects you and other patients whose appointments are after yours.    Again, thank you for choosing [facility].  Our hope is that these requests will decrease the amount of time that you wait before being seen by our physicians.       _____________________________________________________________  Should you have questions after your visit to [facility], please contact our office at [phone] between the hours of 8:30 a.m. and 5:00 p.m.  Voicemails left after 4:30 p.m. will not be returned until the following business day.  For prescription refill requests, have your pharmacy contact our office with your prescription refill request.

## 2013-12-06 ENCOUNTER — Encounter (HOSPITAL_COMMUNITY): Payer: Managed Care, Other (non HMO) | Attending: Hematology and Oncology

## 2013-12-06 VITALS — BP 100/59 | HR 104 | Temp 97.9°F | Resp 18 | Wt 105.4 lb

## 2013-12-06 DIAGNOSIS — C778 Secondary and unspecified malignant neoplasm of lymph nodes of multiple regions: Secondary | ICD-10-CM

## 2013-12-06 DIAGNOSIS — C3410 Malignant neoplasm of upper lobe, unspecified bronchus or lung: Secondary | ICD-10-CM

## 2013-12-06 DIAGNOSIS — Z5112 Encounter for antineoplastic immunotherapy: Secondary | ICD-10-CM

## 2013-12-06 DIAGNOSIS — C3400 Malignant neoplasm of unspecified main bronchus: Secondary | ICD-10-CM | POA: Insufficient documentation

## 2013-12-06 MED ORDER — SODIUM CHLORIDE 0.9 % IJ SOLN: 10.0000 mL | INTRAMUSCULAR | Status: DC | PRN

## 2013-12-06 MED ORDER — HEPARIN SOD (PORK) LOCK FLUSH 100 UNIT/ML IV SOLN
500.0000 [IU] | Freq: Once | INTRAVENOUS | Status: AC | PRN
  Administered 2013-12-06: 500 [IU]
  Filled 2013-12-06: qty 5

## 2013-12-06 MED ORDER — SODIUM CHLORIDE 0.9 % IV SOLN
3.0000 mg/kg | Freq: Once | INTRAVENOUS | Status: AC
  Administered 2013-12-06: 150 mg via INTRAVENOUS
  Filled 2013-12-06: qty 15

## 2013-12-06 MED ORDER — SODIUM CHLORIDE 0.9 % IV SOLN
Freq: Once | INTRAVENOUS | Status: AC
  Administered 2013-12-06: 10:00:00 via INTRAVENOUS

## 2013-12-06 NOTE — Patient Instructions (Signed)
[facility] Discharge Instructions for Patients Receiving Chemotherapy  Today you received the following chemotherapy agents nivolumab  To help prevent nausea and vomiting after your treatment, we encourage you to take your nausea medication   If you develop nausea and vomiting that is not controlled by your nausea medication, call the clinic.   BELOW ARE SYMPTOMS THAT SHOULD BE REPORTED IMMEDIATELY:  *FEVER GREATER THAN 100.5 F  *CHILLS WITH OR WITHOUT FEVER  NAUSEA AND VOMITING THAT IS NOT CONTROLLED WITH YOUR NAUSEA MEDICATION  *UNUSUAL SHORTNESS OF BREATH  *UNUSUAL BRUISING OR BLEEDING  TENDERNESS IN MOUTH AND THROAT WITH OR WITHOUT PRESENCE OF ULCERS  *URINARY PROBLEMS  *BOWEL PROBLEMS  UNUSUAL RASH Items with * indicate a potential emergency and should be followed up as soon as possible.  Feel free to call the clinic you have any questions or concerns. The clinic phone number is [phone].

## 2013-12-06 NOTE — Progress Notes (Signed)
Heather Coffey Tolerated chemotherapy without incident today.  Discharged ambulatory

## 2013-12-14 ENCOUNTER — Other Ambulatory Visit (HOSPITAL_COMMUNITY): Payer: Managed Care, Other (non HMO)

## 2013-12-20 ENCOUNTER — Encounter (HOSPITAL_BASED_OUTPATIENT_CLINIC_OR_DEPARTMENT_OTHER): Payer: Managed Care, Other (non HMO)

## 2013-12-20 ENCOUNTER — Ambulatory Visit (HOSPITAL_COMMUNITY): Payer: Managed Care, Other (non HMO)

## 2013-12-20 ENCOUNTER — Encounter (HOSPITAL_COMMUNITY): Payer: Self-pay

## 2013-12-20 VITALS — BP 113/64 | HR 97 | Temp 97.4°F | Resp 18

## 2013-12-20 VITALS — BP 104/58 | HR 101 | Temp 98.2°F | Resp 20 | Wt 99.2 lb

## 2013-12-20 DIAGNOSIS — C7800 Secondary malignant neoplasm of unspecified lung: Secondary | ICD-10-CM

## 2013-12-20 DIAGNOSIS — C3410 Malignant neoplasm of upper lobe, unspecified bronchus or lung: Secondary | ICD-10-CM

## 2013-12-20 DIAGNOSIS — C3400 Malignant neoplasm of unspecified main bronchus: Secondary | ICD-10-CM | POA: Diagnosis present

## 2013-12-20 DIAGNOSIS — T451X5A Adverse effect of antineoplastic and immunosuppressive drugs, initial encounter: Secondary | ICD-10-CM

## 2013-12-20 DIAGNOSIS — D6481 Anemia due to antineoplastic chemotherapy: Secondary | ICD-10-CM

## 2013-12-20 DIAGNOSIS — C7801 Secondary malignant neoplasm of right lung: Secondary | ICD-10-CM

## 2013-12-20 DIAGNOSIS — Z5112 Encounter for antineoplastic immunotherapy: Secondary | ICD-10-CM

## 2013-12-20 DIAGNOSIS — C771 Secondary and unspecified malignant neoplasm of intrathoracic lymph nodes: Secondary | ICD-10-CM

## 2013-12-20 DIAGNOSIS — C7802 Secondary malignant neoplasm of left lung: Secondary | ICD-10-CM

## 2013-12-20 LAB — COMPREHENSIVE METABOLIC PANEL
AST: 10 U/L (ref 0–37)
Albumin: 2.8 g/dL — ABNORMAL LOW (ref 3.5–5.2)
Alkaline Phosphatase: 133 U/L — ABNORMAL HIGH (ref 39–117)
Anion gap: 14 (ref 5–15)
BUN: 29 mg/dL — ABNORMAL HIGH (ref 6–23)
CALCIUM: 9.9 mg/dL (ref 8.4–10.5)
CO2: 21 mEq/L (ref 19–32)
CREATININE: 1.47 mg/dL — AB (ref 0.50–1.10)
Chloride: 103 mEq/L (ref 96–112)
GFR calc Af Amer: 47 mL/min — ABNORMAL LOW (ref >90)
GFR calc non Af Amer: 40 mL/min — ABNORMAL LOW (ref >90)
Glucose, Bld: 107 mg/dL — ABNORMAL HIGH (ref 70–99)
Potassium: 4.2 mEq/L (ref 3.7–5.3)
SODIUM: 138 meq/L (ref 137–147)
Total Bilirubin: <0.2 mg/dL — ABNORMAL LOW (ref 0.3–1.2)
Total Protein: 7.6 g/dL (ref 6.0–8.3)

## 2013-12-20 LAB — CBC WITH DIFFERENTIAL/PLATELET
BASOS PCT: 0 % (ref 0–1)
Basophils Absolute: 0 10*3/uL (ref 0.0–0.1)
Eosinophils Absolute: 0.1 10*3/uL (ref 0.0–0.7)
Eosinophils Relative: 1 % (ref 0–5)
HEMATOCRIT: 30.6 % — AB (ref 36.0–46.0)
Hemoglobin: 9.7 g/dL — ABNORMAL LOW (ref 12.0–15.0)
Lymphocytes Relative: 9 % — ABNORMAL LOW (ref 12–46)
Lymphs Abs: 0.6 10*3/uL — ABNORMAL LOW (ref 0.7–4.0)
MCH: 31.8 pg (ref 26.0–34.0)
MCHC: 31.7 g/dL (ref 30.0–36.0)
MCV: 100.3 fL — ABNORMAL HIGH (ref 78.0–100.0)
MONO ABS: 0.8 10*3/uL (ref 0.1–1.0)
Monocytes Relative: 13 % — ABNORMAL HIGH (ref 3–12)
Neutro Abs: 4.6 10*3/uL (ref 1.7–7.7)
Neutrophils Relative %: 77 % (ref 43–77)
PLATELETS: 271 10*3/uL (ref 150–400)
RBC: 3.05 MIL/uL — ABNORMAL LOW (ref 3.87–5.11)
RDW: 16.9 % — ABNORMAL HIGH (ref 11.5–15.5)
WBC: 6 10*3/uL (ref 4.0–10.5)

## 2013-12-20 LAB — TSH: TSH: 0.423 u[IU]/mL (ref 0.350–4.500)

## 2013-12-20 LAB — SEDIMENTATION RATE: Sed Rate: 124 mm/hr — ABNORMAL HIGH (ref 0–22)

## 2013-12-20 MED ORDER — SODIUM CHLORIDE 0.9 % IJ SOLN
10.0000 mL | INTRAMUSCULAR | Status: DC | PRN
  Administered 2013-12-20: 10 mL

## 2013-12-20 MED ORDER — SODIUM CHLORIDE 0.9 % IV SOLN
3.0000 mg/kg | Freq: Once | INTRAVENOUS | Status: AC
  Administered 2013-12-20: 150 mg via INTRAVENOUS
  Filled 2013-12-20: qty 15

## 2013-12-20 MED ORDER — DARBEPOETIN ALFA-POLYSORBATE 300 MCG/0.6ML IJ SOLN
INTRAMUSCULAR | Status: AC
  Filled 2013-12-20: qty 0.6

## 2013-12-20 MED ORDER — SODIUM CHLORIDE 0.9 % IV SOLN
Freq: Once | INTRAVENOUS | Status: AC
  Administered 2013-12-20: 10:00:00 via INTRAVENOUS

## 2013-12-20 MED ORDER — BENZONATATE 100 MG PO CAPS: ORAL_CAPSULE | ORAL | Status: AC

## 2013-12-20 MED ORDER — DARBEPOETIN ALFA-POLYSORBATE 300 MCG/0.6ML IJ SOLN
300.0000 ug | Freq: Once | INTRAMUSCULAR | Status: AC
  Administered 2013-12-20: 300 ug via SUBCUTANEOUS

## 2013-12-20 MED ORDER — HEPARIN SOD (PORK) LOCK FLUSH 100 UNIT/ML IV SOLN
500.0000 [IU] | Freq: Once | INTRAVENOUS | Status: AC | PRN
  Administered 2013-12-20: 500 [IU]
  Filled 2013-12-20: qty 5

## 2013-12-20 NOTE — Patient Instructions (Signed)
[facility] Discharge Instructions  RECOMMENDATIONS MADE BY THE CONSULTANT AND ANY TEST RESULTS WILL BE SENT TO YOUR REFERRING PHYSICIAN.  EXAM FINDINGS BY THE PHYSICIAN TODAY AND SIGNS OR SYMPTOMS TO REPORT TO CLINIC OR PRIMARY PHYSICIAN: Exam and findings as discussed by Dr. Barnet Glasgow.  Try to increase your caloric intake.  Use your carnation instant breakfast, eat ice cream, etc.  We will start you on Aranesp to help increase your red blood cell count and hopefully improve your energy.  Report fevers, uncontrolled nausea, vomiting or other concerns.  MEDICATIONS PRESCRIBED:  Tessalon - take as directed to help with your cough.  INSTRUCTIONS/FOLLOW-UP: Follow-up in 2 weeks.  Thank you for choosing [facility] to provide your oncology and hematology care.  To afford each patient quality time with our providers, please arrive at least 15 minutes before your scheduled appointment time.  With your help, our goal is to use those 15 minutes to complete the necessary work-up to ensure our physicians have the information they need to help with your evaluation and healthcare recommendations.    Effective January 1st, 2014, we ask that you re-schedule your appointment with our physicians should you arrive 10 or more minutes late for your appointment.  We strive to give you quality time with our providers, and arriving late affects you and other patients whose appointments are after yours.    Again, thank you for choosing [facility].  Our hope is that these requests will decrease the amount of time that you wait before being seen by our physicians.       _____________________________________________________________  Should you have questions after your visit to [facility], please contact our office at [phone] between the hours of 8:30 a.m. and 4:30 p.m.  Voicemails left after 4:30 p.m. will not be returned until the following business  day.  For prescription refill requests, have your pharmacy contact our office with your prescription refill request.    _______________________________________________________________  We hope that we have given you very good care.  You may receive a patient satisfaction survey in the mail, please complete it and return it as soon as possible.  We value your feedback!  _______________________________________________________________  Have you asked about our STAR program?  STAR stands for Survivorship Training and Rehabilitation, and this is a nationally recognized cancer care program that focuses on survivorship and rehabilitation.  Cancer and cancer treatments may cause problems, such as, pain, making you feel tired and keeping you from doing the things that you need or want to do. Cancer rehabilitation can help. Our goal is to reduce these troubling effects and help you have the best quality of life possible.  You may receive a survey from a nurse that asks questions about your current state of health.  Based on the survey results, all eligible patients will be referred to the [facility] program for an evaluation so we can better serve you!  A frequently asked questions sheet is available upon request.

## 2013-12-20 NOTE — Progress Notes (Signed)
Demetrio Lapping Baldassari Tolerated chemotherapy well today. Also received aranesp 300 mcg

## 2013-12-20 NOTE — Addendum Note (Signed)
Addended by: Mellissa Kohut on: 12/20/2013 12:29 PM   Modules accepted: Orders

## 2013-12-20 NOTE — Progress Notes (Signed)
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 Lung Cancer, stage IV, squamous cell    CURRENT THERAPY: Nivolumab every 2 weeks for cycle #3 today, treatment begun on 11/22/2013.  INTERVAL HISTORY: Heather Coffey 53 y.o. 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 She had been treated previously for stage IIIB disease with carboplatin/Taxol/radiotherapy but recent CT scan and PET scan show evidence of progression in the chest and therefore level that treatment was instituted. Her last chemotherapy was on 07/04/2013 after 6 cycles with radiotherapy completed on 07/18/2013. She is accompanied by her husband today. He states that she has very little energy. She stays on the couch all day. She is not very active. She does not sleep well because of persistent cough. She is using Advair every 12 hours and is using a Combivent inhaler in between if needed. She denies any fever, night sweats, but does have greenish expectoration when using inhalers. Bowel movements are regular with no palpable distention, lower extremity swelling or redness, or any severe nausea. According to her husband she is eating.    MEDICAL HISTORY: Past Medical History  Diagnosis Date  . Arthritis   . Anxiety   . Hyperlipidemia   . Tobacco abuse   . Shortness of breath     Hx: of breath with exertion  . Depression   . GERD (gastroesophageal reflux disease)   . H/O hiatal hernia   . 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     Lung cancer, lingula   04/29/2013 Initial Diagnosis Squamous cell carcinoma   05/30/2013 - 07/04/2013 Chemotherapy Carboplatin/Paclitaxel weekly in combination with radiation therapy   05/30/2013 - 07/19/2013 Radiation Therapy In combination with chemotherapy   11/22/2013 -  Chemotherapy Nivolumab intravenously every 2 weeks    ALLERGIES:  is allergic to augmentin; codeine; and tetracyclines & related.  MEDICATIONS: has a current medication list which includes the following prescription(s): alprazolam, cyclobenzaprine, first-dukes mouthwash, dronabinol, esomeprazole, fluticasone, fluticasone-salmeterol, ibuprofen, ipratropium-albuterol, lidocaine-prilocaine, loratadine-pseudoephedrine, montelukast, multivitamin, nivolumab, pravastatin, prochlorperazine, sucralfate, benzonatate, fluoxetine, metoclopramide, prednisone, and tramadol, and the following Facility-Administered Medications: heparin lock flush, nivolumab (OPDIVO) 150 mg in sodium chloride 0.9 % 100 mL chemo infusion, sodium chloride, and sodium chloride.  SURGICAL HISTORY:  Past Surgical History  Procedure Laterality Date  . Tubal ligation    . Video bronchoscopy with endobronchial ultrasound N/A 05/03/2013    Procedure: VIDEO BRONCHOSCOPY WITH ENDOBRONCHIAL ULTRASOUND;  Surgeon: Melrose Nakayama, MD;  Location: [facility];  Service: Thoracic;  Laterality: N/A;  . Nasal septum surgery    . Portacath placement Left 05/21/2013    Procedure: INSERTION PORT-A-CATH;  Surgeon: Melrose Nakayama, MD;  Location: [facility];  Service: Thoracic;  Laterality: Left;  . 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 schatzki's ring,  distal one half the tubular esophagus severely  inflamed. hiatal hernia bx= squamous mucosa with acute ulcer, candida  . Maloney dilation N/A 09/13/2013    Procedure: Venia Minks DILATION;  Surgeon: Daneil Dolin, MD;  [facility];  Service: Endoscopy;  Laterality: N/A;  . Savory dilation N/A 09/13/2013    Procedure: SAVORY DILATION;  Surgeon: Daneil Dolin, MD;  Location[facility];  Service: Endoscopy;  Laterality: N/A;  . Breast lumpectomy Right     Hx: of  . Vaginal hysterectomy      still has ovaries per pt  . Port a cath revision Left 10/11/2013    Procedure: LEFT PORT A CATH REVISION;  Surgeon: Melrose Nakayama, MD;  Location: [facility];  Service: Thoracic;  Laterality: Left;  LOCAL ANETHESIA PER DR. HENDRICKSON    FAMILY HISTORY: family history includes COPD in her brother, father, mother, and sister; Cancer in her sister; Diabetes in her sister; Hypercholesterolemia in her mother. There is no history of Colon cancer.  SOCIAL HISTORY:  reports that she has been smoking Cigarettes.  She has a 8.75 pack-year smoking history. She has never used smokeless tobacco. She reports that she does not drink alcohol or use illicit drugs.  REVIEW OF SYSTEMS:  Other than that discussed above is noncontributory.  PHYSICAL EXAMINATION: ECOG PERFORMANCE STATUS: 1 - Symptomatic but completely ambulatory  Blood pressure 104/58, pulse 101, temperature 98.2 F (36.8 C), temperature source Oral, resp. rate 20, weight 99 lb 3.2 oz (44.997 kg), SpO2 95.00%.  GENERAL:alert, no distress and comfortable. Looking much older than her stated age. SKIN: skin color, texture, turgor are normal, no rashes or significant lesions EYES: PERLA; Conjunctiva are pink and non-injected, sclera clear SINUSES: No redness or tenderness over maxillary or ethmoid sinuses OROPHARYNX:no exudate, no erythema on lips, buccal mucosa, or tongue. Newly E. DeAngelis. NECK: supple, thyroid normal size, non-tender, without  nodularity. No masses CHEST: Increased AP diameter with no breast masses. LYMPH:  no palpable lymphadenopathy in the cervical, axillary or inguinal LUNGS: clear to auscultation and percussion with normal breathing effort HEART: regular rate & rhythm and no murmurs. ABDOMEN:abdomen soft, non-tender and normal bowel sounds. Scaphoid with no hepatomegaly, ascites, or CVA tenderness. MUSCULOSKELETAL:no cyanosis of digits and no clubbing. Range of motion normal.  NEURO: alert & oriented x 3 with fluent speech, no focal motor/sensory deficits. Normal deep tendon reflexes.   LABORATORY DATA: Infusion on 12/20/2013  Component Date Value Ref Range Status  . WBC 12/20/2013 6.0  4.0 - 10.5 K/uL Final  . RBC 12/20/2013 3.05* 3.87 - 5.11 MIL/uL Final  . Hemoglobin 12/20/2013 9.7* 12.0 - 15.0 g/dL Final  . HCT 12/20/2013 30.6* 36.0 - 46.0 % Final  . MCV 12/20/2013 100.3* 78.0 - 100.0 fL Final  . MCH 12/20/2013 31.8  26.0 - 34.0 pg Final  . MCHC 12/20/2013 31.7  30.0 - 36.0 g/dL Final  . RDW 12/20/2013 16.9* 11.5 - 15.5 % Final  . Platelets 12/20/2013 271  150 - 400 K/uL Final  . Neutrophils Relative % 12/20/2013 77  43 - 77 % Final  . Neutro Abs 12/20/2013 4.6  1.7 - 7.7 K/uL Final  . Lymphocytes Relative 12/20/2013 9* 12 - 46 % Final  . Lymphs Abs 12/20/2013 0.6* 0.7 - 4.0 K/uL Final  . Monocytes Relative 12/20/2013 13* 3 - 12 % Final  . Monocytes Absolute 12/20/2013 0.8  0.1 - 1.0 K/uL Final  . Eosinophils Relative 12/20/2013 1  0 - 5 % Final  . Eosinophils Absolute 12/20/2013 0.1  0.0 - 0.7 K/uL Final  . Basophils Relative 12/20/2013 0  0 - 1 % Final  . Basophils Absolute 12/20/2013 0.0  0.0 - 0.1 K/uL Final  . Sodium 12/20/2013 138  137 - 147 mEq/L Final  . Potassium 12/20/2013 4.2  3.7 - 5.3 mEq/L Final  . Chloride 12/20/2013 103  96 - 112 mEq/L Final  . CO2 12/20/2013 21  19 - 32 mEq/L Final  . Glucose, Bld 12/20/2013 107* 70 - 99 mg/dL Final  . BUN 12/20/2013 29* 6 - 23 mg/dL Final  .  Creatinine, Ser 12/20/2013 1.47* 0.50 - 1.10 mg/dL Final  . Calcium 12/20/2013 9.9  8.4 - 10.5 mg/dL Final  . Total Protein 12/20/2013 7.6  6.0 - 8.3 g/dL Final  . Albumin 12/20/2013 2.8* 3.5 - 5.2 g/dL Final  . AST 12/20/2013 10  0 - 37 U/L Final  . ALT 12/20/2013 <5  0 - 35 U/L Final  . Alkaline Phosphatase 12/20/2013 133* 39 - 117 U/L Final  . Total Bilirubin 12/20/2013 <0.2* 0.3 - 1.2 mg/dL Final  . GFR calc non Af Amer 12/20/2013 40* >90 mL/min Final  . GFR calc Af Amer 12/20/2013 47* >90 mL/min Final   Comment: (NOTE)                          The eGFR has been calculated using the CKD EPI equation.                          This calculation has not been validated in all clinical situations.                          eGFR's persistently <90 mL/min signify possible Chronic Kidney                          Disease.  . Anion gap 12/20/2013 14  5 - 15 Final  Infusion on 11/22/2013  Component Date Value Ref Range Status  . WBC 11/22/2013 5.2  4.0 - 10.5 K/uL Final  . RBC 11/22/2013 3.07* 3.87 - 5.11 MIL/uL Final  . Hemoglobin 11/22/2013 10.0* 12.0 - 15.0 g/dL Final  . HCT 11/22/2013 31.3* 36.0 - 46.0 % Final  . MCV 11/22/2013 102.0* 78.0 - 100.0 fL Final  . MCH 11/22/2013 32.6  26.0 - 34.0 pg Final  . MCHC 11/22/2013 31.9  30.0 - 36.0 g/dL Final  . RDW 11/22/2013 15.8* 11.5 - 15.5 % Final  . Platelets 11/22/2013 215  150 - 400 K/uL Final  . Neutrophils Relative % 11/22/2013 78* 43 - 77 % Final  . Neutro Abs 11/22/2013 4.0  1.7 - 7.7 K/uL Final  . Lymphocytes Relative 11/22/2013 11* 12 - 46 % Final  . Lymphs Abs 11/22/2013 0.6* 0.7 - 4.0 K/uL Final  . Monocytes Relative 11/22/2013 10  3 - 12 % Final  . Monocytes Absolute 11/22/2013 0.5  0.1 - 1.0 K/uL Final  . Eosinophils Relative 11/22/2013 1  0 - 5 % Final  . Eosinophils Absolute 11/22/2013 0.0  0.0 - 0.7 K/uL Final  . Basophils Relative 11/22/2013 0  0 - 1 % Final  . Basophils Absolute 11/22/2013 0.0  0.0 - 0.1 K/uL Final  .  Sodium 11/22/2013 139  137 - 147 mEq/L Final  . Potassium 11/22/2013 3.9  3.7 - 5.3 mEq/L Final  . Chloride 11/22/2013  103  96 - 112 mEq/L Final  . CO2 11/22/2013 21  19 - 32 mEq/L Final  . Glucose, Bld 11/22/2013 103* 70 - 99 mg/dL Final  . BUN 11/22/2013 12  6 - 23 mg/dL Final  . Creatinine, Ser 11/22/2013 0.72  0.50 - 1.10 mg/dL Final  . Calcium 11/22/2013 9.1  8.4 - 10.5 mg/dL Final  . Total Protein 11/22/2013 7.2  6.0 - 8.3 g/dL Final  . Albumin 11/22/2013 3.0* 3.5 - 5.2 g/dL Final  . AST 11/22/2013 11  0 - 37 U/L Final  . ALT 11/22/2013 6  0 - 35 U/L Final  . Alkaline Phosphatase 11/22/2013 95  39 - 117 U/L Final  . Total Bilirubin 11/22/2013 <0.2* 0.3 - 1.2 mg/dL Final  . GFR calc non Af Amer 11/22/2013 >90  >90 mL/min Final  . GFR calc Af Amer 11/22/2013 >90  >90 mL/min Final   Comment: (NOTE)                          The eGFR has been calculated using the CKD EPI equation.                          This calculation has not been validated in all clinical situations.                          eGFR's persistently <90 mL/min signify possible Chronic Kidney                          Disease.  . Anion gap 11/22/2013 15  5 - 15 Final  . LDH 11/22/2013 158  94 - 250 U/L Final  . TSH 11/22/2013 0.791  0.350 - 4.500 uIU/mL Final   Performed at [facility]: Squamous cell carcinoma  Urinalysis No results found for this basename: colorurine,  appearanceur,  labspec,  phurine,  glucoseu,  hgbur,  bilirubinur,  ketonesur,  proteinur,  urobilinogen,  nitrite,  leukocytesur    RADIOGRAPHIC STUDIES: No results found.  ASSESSMENT:  #1. 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  2. Chronic obstructive pulmonary disease, unfortunately still smoking.  3. History of right lung abscess, status post medical treatment only occurring about 3 or 4 years ago requiring  hospitalization and intravenous antibiotics with no antecedent history of aspiration or loss of consciousness.  4. Gastroesophageal reflux disease, controlled, status post EGD with dilatation of the stricture.  5. 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  6. Leukopenia and thrombocytopenia from previous chemotherapy.  7. Insomnia due to cough. 8. Anemia secondary to chemotherapy      PLAN:  #1. Repeat TSH today. #2. Tessalon 100 mg up to 4 times a day to control cough, particularly at bedtime. #3. Increase activity and as long as no nausea is evident, increase caloric intake using supplements such as [employer] with ice cream. #4. Nivolumab IV today and again in 2 weeks. #5. Aranesp 300 mcg and every 2 weeks #5. Followup in 2 weeks.   All questions were answered. The patient knows to call the clinic with any problems, questions or concerns. We can certainly see the patient much sooner if necessary.   I spent 25 minutes counseling the patient face to face. The total time  spent in the appointment was 30 minutes.    Doroteo Bradford, MD 12/20/2013 10:51 AM  DISCLAIMER:  This note was dictated with voice recognition software.  Similar sounding words can inadvertently be transcribed inaccurately and may not be corrected upon review.

## 2013-12-20 NOTE — Progress Notes (Signed)
STAR Program Physical Impairment and Functional Assessment Screening Tool  1. Are you having any pain, including headaches, joint pain, or muscle pain (upper body = OT; lower body = PT)?  Yes, this started after my diagnosis and is still a problem.  2. Do your hands and/or feet feel numb or tingle (PT)?  No  3. Does any part of your body feel swollen or larger than usual (upper body = OT; lower body = PT)?  Yes, this started after my diagnosis and is still a problem.  4. Are you so tired that you cannot do the things you want or need to do (PT or OT)?  Yes, this started after my diagnosis and is still a problem.  5. Are you feeling weak or are you having trouble moving any part of your body (PT/OT)?  Yes, this started after my diagnosis and is still a problem.  6. Are you having trouble concentrating, thinking, or remembering things (OT/ST)?  Yes, this started after my diagnosis and is still a problem.  7. Are you having trouble moving around or feel like you might trip or fall (PT)?  Yes, this started after my diagnosis and is still a problem.  8. Are you having trouble swallowing (ST)?  Yes, this started after my diagnosis and is still a problem.  9. Are you having trouble speaking (ST)?  No  10. Are you having trouble with going or getting to the bathroom (OT)?  No  11. Are you having trouble with your sexual function (OT)?  Yes, this started after my diagnosis and is still a problem.  12. Are you having trouble lifting things, even just your arms (OT/PT)?  Yes, this started after my diagnosis and is still a problem.  98. Are you having trouble taking care of yourself as in dressing or bathing (OT)?  No  14. Are you having trouble with daily tasks like chores or shopping (OT)?  Yes, this started after my diagnosis and is still a problem.  15. Are you having trouble driving (OT)?  Yes, this started after my diagnosis and is still a problem.  60. Are you  having trouble returning to work or completing your tasks at work (OT)?  Yes, this started after my diagnosis and is still a problem.  Other concerns:    Legend: OT = Occupational Therapy PT = Physical Therapy ST = Speech Therapy

## 2013-12-24 ENCOUNTER — Inpatient Hospital Stay (HOSPITAL_COMMUNITY)
Admission: EM | Admit: 2013-12-24 | Discharge: 2013-12-26 | DRG: 193 | Disposition: A | Discharge status: Home / Self Care | Payer: Managed Care, Other (non HMO) | Attending: Internal Medicine | Admitting: Internal Medicine

## 2013-12-24 ENCOUNTER — Emergency Department (HOSPITAL_COMMUNITY): Payer: Managed Care, Other (non HMO)

## 2013-12-24 ENCOUNTER — Encounter (HOSPITAL_COMMUNITY): Payer: Self-pay | Admitting: Emergency Medicine

## 2013-12-24 DIAGNOSIS — K219 Gastro-esophageal reflux disease without esophagitis: Secondary | ICD-10-CM | POA: Diagnosis present

## 2013-12-24 DIAGNOSIS — F329 Major depressive disorder, single episode, unspecified: Secondary | ICD-10-CM | POA: Diagnosis present

## 2013-12-24 DIAGNOSIS — E785 Hyperlipidemia, unspecified: Secondary | ICD-10-CM | POA: Diagnosis present

## 2013-12-24 DIAGNOSIS — Z66 Do not resuscitate: Secondary | ICD-10-CM | POA: Diagnosis present

## 2013-12-24 DIAGNOSIS — R5381 Other malaise: Secondary | ICD-10-CM | POA: Diagnosis present

## 2013-12-24 DIAGNOSIS — F411 Generalized anxiety disorder: Secondary | ICD-10-CM | POA: Diagnosis present

## 2013-12-24 DIAGNOSIS — Z9221 Personal history of antineoplastic chemotherapy: Secondary | ICD-10-CM | POA: Diagnosis not present

## 2013-12-24 DIAGNOSIS — IMO0002 Reserved for concepts with insufficient information to code with codable children: Secondary | ICD-10-CM | POA: Diagnosis not present

## 2013-12-24 DIAGNOSIS — T451X5A Adverse effect of antineoplastic and immunosuppressive drugs, initial encounter: Secondary | ICD-10-CM

## 2013-12-24 DIAGNOSIS — F17200 Nicotine dependence, unspecified, uncomplicated: Secondary | ICD-10-CM | POA: Diagnosis present

## 2013-12-24 DIAGNOSIS — C3490 Malignant neoplasm of unspecified part of unspecified bronchus or lung: Secondary | ICD-10-CM | POA: Diagnosis present

## 2013-12-24 DIAGNOSIS — F3289 Other specified depressive episodes: Secondary | ICD-10-CM | POA: Diagnosis present

## 2013-12-24 DIAGNOSIS — J449 Chronic obstructive pulmonary disease, unspecified: Secondary | ICD-10-CM

## 2013-12-24 DIAGNOSIS — Z9181 History of falling: Secondary | ICD-10-CM

## 2013-12-24 DIAGNOSIS — Z23 Encounter for immunization: Secondary | ICD-10-CM

## 2013-12-24 DIAGNOSIS — D539 Nutritional anemia, unspecified: Secondary | ICD-10-CM | POA: Diagnosis present

## 2013-12-24 DIAGNOSIS — C3410 Malignant neoplasm of upper lobe, unspecified bronchus or lung: Secondary | ICD-10-CM

## 2013-12-24 DIAGNOSIS — Z833 Family history of diabetes mellitus: Secondary | ICD-10-CM | POA: Diagnosis not present

## 2013-12-24 DIAGNOSIS — J4489 Other specified chronic obstructive pulmonary disease: Secondary | ICD-10-CM | POA: Diagnosis present

## 2013-12-24 DIAGNOSIS — M129 Arthropathy, unspecified: Secondary | ICD-10-CM | POA: Diagnosis present

## 2013-12-24 DIAGNOSIS — G9340 Encephalopathy, unspecified: Secondary | ICD-10-CM

## 2013-12-24 DIAGNOSIS — C7931 Secondary malignant neoplasm of brain: Secondary | ICD-10-CM | POA: Diagnosis present

## 2013-12-24 DIAGNOSIS — C771 Secondary and unspecified malignant neoplasm of intrathoracic lymph nodes: Secondary | ICD-10-CM

## 2013-12-24 DIAGNOSIS — N179 Acute kidney failure, unspecified: Secondary | ICD-10-CM | POA: Diagnosis present

## 2013-12-24 DIAGNOSIS — C7949 Secondary malignant neoplasm of other parts of nervous system: Secondary | ICD-10-CM | POA: Diagnosis present

## 2013-12-24 DIAGNOSIS — R1310 Dysphagia, unspecified: Secondary | ICD-10-CM

## 2013-12-24 DIAGNOSIS — R1319 Other dysphagia: Secondary | ICD-10-CM

## 2013-12-24 DIAGNOSIS — D899 Disorder involving the immune mechanism, unspecified: Secondary | ICD-10-CM | POA: Diagnosis present

## 2013-12-24 DIAGNOSIS — Z8349 Family history of other endocrine, nutritional and metabolic diseases: Secondary | ICD-10-CM | POA: Diagnosis not present

## 2013-12-24 DIAGNOSIS — Z923 Personal history of irradiation: Secondary | ICD-10-CM | POA: Diagnosis not present

## 2013-12-24 DIAGNOSIS — Z7401 Bed confinement status: Secondary | ICD-10-CM | POA: Diagnosis not present

## 2013-12-24 DIAGNOSIS — J189 Pneumonia, unspecified organism: Principal | ICD-10-CM

## 2013-12-24 DIAGNOSIS — D6481 Anemia due to antineoplastic chemotherapy: Secondary | ICD-10-CM

## 2013-12-24 DIAGNOSIS — Z79899 Other long term (current) drug therapy: Secondary | ICD-10-CM

## 2013-12-24 DIAGNOSIS — J852 Abscess of lung without pneumonia: Secondary | ICD-10-CM

## 2013-12-24 DIAGNOSIS — R5383 Other fatigue: Secondary | ICD-10-CM | POA: Diagnosis not present

## 2013-12-24 LAB — TROPONIN I

## 2013-12-24 LAB — CBC WITH DIFFERENTIAL/PLATELET
BASOS ABS: 0 10*3/uL (ref 0.0–0.1)
BASOS PCT: 0 % (ref 0–1)
EOS ABS: 0.1 10*3/uL (ref 0.0–0.7)
EOS PCT: 2 % (ref 0–5)
HCT: 28.3 % — ABNORMAL LOW (ref 36.0–46.0)
Hemoglobin: 8.9 g/dL — ABNORMAL LOW (ref 12.0–15.0)
Lymphocytes Relative: 11 % — ABNORMAL LOW (ref 12–46)
Lymphs Abs: 0.7 10*3/uL (ref 0.7–4.0)
MCH: 31.8 pg (ref 26.0–34.0)
MCHC: 31.4 g/dL (ref 30.0–36.0)
MCV: 101.1 fL — AB (ref 78.0–100.0)
Monocytes Absolute: 0.8 10*3/uL (ref 0.1–1.0)
Monocytes Relative: 13 % — ABNORMAL HIGH (ref 3–12)
NEUTROS PCT: 74 % (ref 43–77)
Neutro Abs: 4.6 10*3/uL (ref 1.7–7.7)
PLATELETS: 320 10*3/uL (ref 150–400)
RBC: 2.8 MIL/uL — ABNORMAL LOW (ref 3.87–5.11)
RDW: 17.1 % — AB (ref 11.5–15.5)
WBC: 6.2 10*3/uL (ref 4.0–10.5)

## 2013-12-24 LAB — COMPREHENSIVE METABOLIC PANEL
ALBUMIN: 2.8 g/dL — AB (ref 3.5–5.2)
ALK PHOS: 125 U/L — AB (ref 39–117)
ALT: 5 U/L (ref 0–35)
AST: 11 U/L (ref 0–37)
Anion gap: 15 (ref 5–15)
BUN: 37 mg/dL — ABNORMAL HIGH (ref 6–23)
CO2: 19 mEq/L (ref 19–32)
Calcium: 10 mg/dL (ref 8.4–10.5)
Chloride: 104 mEq/L (ref 96–112)
Creatinine, Ser: 1.76 mg/dL — ABNORMAL HIGH (ref 0.50–1.10)
GFR calc non Af Amer: 32 mL/min — ABNORMAL LOW (ref >90)
GFR, EST AFRICAN AMERICAN: 38 mL/min — AB (ref >90)
Glucose, Bld: 97 mg/dL (ref 70–99)
POTASSIUM: 3.8 meq/L (ref 3.7–5.3)
SODIUM: 138 meq/L (ref 137–147)
TOTAL PROTEIN: 7.5 g/dL (ref 6.0–8.3)
Total Bilirubin: <0.2 mg/dL — ABNORMAL LOW (ref 0.3–1.2)

## 2013-12-24 MED ORDER — VANCOMYCIN HCL 500 MG IV SOLR
500.0000 mg | INTRAVENOUS | Status: DC
  Administered 2013-12-25: 500 mg via INTRAVENOUS
  Filled 2013-12-24 (×2): qty 500

## 2013-12-24 MED ORDER — SODIUM CHLORIDE 0.9 % IV BOLUS (SEPSIS)
1000.0000 mL | Freq: Once | INTRAVENOUS | Status: AC
  Administered 2013-12-24: 1000 mL via INTRAVENOUS

## 2013-12-24 MED ORDER — METHYLPREDNISOLONE SODIUM SUCC 125 MG IJ SOLR
125.0000 mg | Freq: Two times a day (BID) | INTRAMUSCULAR | Status: DC
  Administered 2013-12-25: 125 mg via INTRAVENOUS
  Filled 2013-12-24: qty 2

## 2013-12-24 MED ORDER — CEFEPIME HCL 1 G IJ SOLR: 1.0000 g | Freq: Three times a day (TID) | INTRAMUSCULAR | Status: DC

## 2013-12-24 MED ORDER — VANCOMYCIN HCL IN DEXTROSE 1-5 GM/200ML-% IV SOLN
1000.0000 mg | Freq: Once | INTRAVENOUS | Status: AC
  Administered 2013-12-24: 1000 mg via INTRAVENOUS
  Filled 2013-12-24: qty 200

## 2013-12-24 MED ORDER — HEPARIN SODIUM (PORCINE) 5000 UNIT/ML IJ SOLN
5000.0000 [IU] | Freq: Three times a day (TID) | INTRAMUSCULAR | Status: DC
  Administered 2013-12-25: 5000 [IU] via SUBCUTANEOUS
  Filled 2013-12-24: qty 1

## 2013-12-24 MED ORDER — DEXTROSE 5 % IV SOLN
1.0000 g | INTRAVENOUS | Status: DC
  Administered 2013-12-25 – 2013-12-26 (×2): 1 g via INTRAVENOUS
  Filled 2013-12-24 (×3): qty 1

## 2013-12-24 MED ORDER — SODIUM CHLORIDE 0.9 % IV BOLUS (SEPSIS)
500.0000 mL | Freq: Once | INTRAVENOUS | Status: AC
  Administered 2013-12-24: 500 mL via INTRAVENOUS

## 2013-12-24 MED ORDER — SODIUM CHLORIDE 0.9 % IV SOLN
INTRAVENOUS | Status: DC
  Administered 2013-12-25: 15:00:00 via INTRAVENOUS
  Administered 2013-12-25: 1000 mL via INTRAVENOUS
  Administered 2013-12-25: 23:00:00 via INTRAVENOUS
  Administered 2013-12-25: 800 mL via INTRAVENOUS
  Administered 2013-12-26: 05:00:00 via INTRAVENOUS

## 2013-12-24 NOTE — Progress Notes (Signed)
ANTIBIOTIC CONSULT NOTE - INITIAL  Pharmacy Consult for Vancomycin & Cefepime Indication: pneumonia  Allergies  Allergen Reactions  . Augmentin [Amoxicillin-Pot Clavulanate] Other (See Comments)    Metal taste in mouth  . Codeine Diarrhea and Nausea And Vomiting  . Tetracyclines & Related Diarrhea, Nausea And Vomiting and Rash    Pt states n/v/d    Patient Measurements: Height: 5\' 2"  (157.5 cm) Weight: 99 lb (44.906 kg) IBW/kg (Calculated) : 50.1 Adjusted Body Weight:   Vital Signs: Temp: 98 F (36.7 C) (09/21 2104) Temp src: Oral (09/21 2104) BP: 100/48 mmHg (09/21 2238) Pulse Rate: 86 (09/21 2238) Intake/Output from previous day:   Intake/Output from this shift:    Labs:  Recent Labs  12/24/13 2148  WBC 6.2  HGB 8.9*  PLT 320  CREATININE 1.76*   Estimated Creatinine Clearance: 26.8 ml/min (by C-G formula based on Cr of 1.76). No results found for this basename: VANCOTROUGH, VANCOPEAK, VANCORANDOM, [place], GENTPEAK, GENTRANDOM, TOBRATROUGH, TOBRAPEAK, TOBRARND, AMIKACINPEAK, AMIKACINTROU, AMIKACIN,  in the last 72 hours   Microbiology: Recent Results (from the past 720 hour(s))  CULTURE, BLOOD (ROUTINE X 2)     Status: None   Collection Time    12/24/13  9:48 PM      Result Value Ref Range Status   Specimen Description BLOOD RIGHT ARM   Final   Special Requests     Final   Value: BOTTLES DRAWN AEROBIC AND ANAEROBIC AEB 5CC ANA 3CC   Culture PENDING   Incomplete   Report Status PENDING   Incomplete  CULTURE, BLOOD (ROUTINE X 2)     Status: None   Collection Time    12/24/13 10:12 PM      Result Value Ref Range Status   Specimen Description BLOOD RIGHT HAND   Final   Special Requests [facility]   Final   Culture PENDING   Incomplete   Report Status PENDING   Incomplete    Medical History: Past Medical History  Diagnosis Date  . Arthritis   . Anxiety   . Hyperlipidemia   . Tobacco abuse   . Shortness of breath     Hx: of breath with exertion   . Depression   . GERD (gastroesophageal reflux disease)   . H/O hiatal hernia   . Lung cancer     Medications:  Scheduled:   Assessment: Vancomycin & Cefepime for pneumonia Reduced renal function Low body weight Labs reviewed  Goal of Therapy:  Vancomycin trough level 15-20 mcg/ml  Plan Vancomycin 1 GM IV loading dose, then 500 mg IV every 24 hours Cefepime 1 GM IV every 24 hours Vancomycin trough at steady state Labs per protocol  Heather Coffey, Heather Coffey 12/24/2013,10:47 PM

## 2013-12-24 NOTE — ED Provider Notes (Signed)
CSN: [id number]     Arrival date & time 12/24/13  2057 History  This chart was scribed for Nat Christen, MD by Evelene Croon, ED Scribe. This patient was seen in room APA18/[facility]18 and the patient's care was started 9:05 PM.    Chief Complaint  Patient presents with  . Altered Mental Status    HPI   HPI Comments:  Heather Coffey is a 53 y.o. female with a h/o lung CA who presents to the Emergency Department complaining of generalized weakness that has worsened within the last 24 hours. Pt states she fell twice today because she felt weak and  "shaky". Denies head injury and LOC. Pt's husband states he has noticed a gradual increase in weakness since her last chemo tx 4 days ago. He also states pt is not acting like her self and is  not eating well, but is still drinking water. Pt denies fever, cough and dysuria. No alleviating factors noted. She was diagnosed with lung cancer in January 2013 and has undergone multiple rounds of chemotherapy and radiation therapy  CA was first diagnosed in January 2015   Past Medical History  Diagnosis Date  . Arthritis   . Anxiety   . Hyperlipidemia   . Tobacco abuse   . Shortness of breath     Hx: of breath with exertion  . Depression   . GERD (gastroesophageal reflux disease)   . H/O hiatal hernia   . Lung cancer    Past Surgical History  Procedure Laterality Date  . Tubal ligation    . Video bronchoscopy with endobronchial ultrasound N/A 05/03/2013    Procedure: VIDEO BRONCHOSCOPY WITH ENDOBRONCHIAL ULTRASOUND;  Surgeon: Melrose Nakayama, MD;  Location: [facility];  Service: Thoracic;  Laterality: N/A;  . Nasal septum surgery    . Portacath placement Left 05/21/2013    Procedure: INSERTION PORT-A-CATH;  Surgeon: Melrose Nakayama, MD;  Location: [facility];  Service: Thoracic;  Laterality: Left;  . 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 schatzki's ring, distal one half the tubular  esophagus severely inflamed. hiatal hernia bx= squamous mucosa with acute ulcer, candida  . Maloney dilation N/A 09/13/2013    Procedure: Venia Minks DILATION;  Surgeon: Daneil Dolin, MD;  [facility];  Service: Endoscopy;  Laterality: N/A;  . Savory dilation N/A 09/13/2013    Procedure: SAVORY DILATION;  Surgeon: Daneil Dolin, MD;  [facility];  Service: Endoscopy;  Laterality: N/A;  . Breast lumpectomy Right     Hx: of  . Vaginal hysterectomy      still has ovaries per pt  . Port a cath revision Left 10/11/2013    Procedure: LEFT PORT A CATH REVISION;  Surgeon: Melrose Nakayama, MD;  Location: [facility];  Service: Thoracic;  Laterality: Left;  LOCAL ANETHESIA PER DR. HENDRICKSON   Family History  Problem Relation Age of Onset  . Cancer Sister     uterine  . Diabetes Sister   . COPD Sister   . COPD Mother   . Hypercholesterolemia Mother   . COPD Father   . COPD Brother   . Colon cancer Neg Hx    History  Substance Use Topics  . Smoking status: Current Every Day Smoker -- 0.25 packs/day for 35 years    Types: Cigarettes  . Smokeless tobacco: Never Used  . Alcohol Use: No   OB History   Grav Para Term Preterm Abortions TAB SAB  Ect Mult Living                 Review of Systems  At least 10pt or greater review of systems completed and are negative except where specified in the HPI.    Allergies  Augmentin; Codeine; and Tetracyclines & related  Home Medications   Prior to Admission medications   Medication Sig Start Date End Date Taking? Authorizing Provider  ALPRAZolam Duanne Moron) 0.5 MG tablet Take 0.5 mg by mouth 3 (three) times daily.     Historical Provider, MD  benzonatate (TESSALON) 100 MG capsule Take 1 perle up to 4 times daily to control cough. 12/20/13   Farrel Gobble, MD  cyclobenzaprine (FLEXERIL) 10 MG tablet 10 mg 3 (three) times daily as needed.  10/16/13   Historical Provider, MD  Diphenhyd-Hydrocort-Nystatin (FIRST-DUKES MOUTHWASH)  SUSP Use as directed 5 mLs in the mouth or throat 4 (four) times daily as needed (for throat).    Historical Provider, MD  dronabinol (MARINOL) 2.5 MG capsule Take 1 capsule (2.5 mg total) by mouth 2 (two) times daily before a meal. 11/28/13   Farrel Gobble, MD  esomeprazole (NEXIUM) 40 MG capsule Take 40 mg by mouth daily.    Historical Provider, MD  FLUoxetine (PROZAC) 20 MG tablet Take 20 mg by mouth daily.    Historical Provider, MD  fluticasone (FLONASE) 50 MCG/ACT nasal spray Place 2 sprays into both nostrils daily as needed for allergies.     Historical Provider, MD  Fluticasone-Salmeterol (ADVAIR) 250-50 MCG/DOSE AEPB Inhale 1 puff into the lungs 2 (two) times daily.    Historical Provider, MD  ibuprofen (ADVIL,MOTRIN) 200 MG tablet Take 400 mg by mouth every 6 (six) hours as needed.    Historical Provider, MD  Ipratropium-Albuterol (COMBIVENT) 20-100 MCG/ACT AERS respimat Inhale 1 puff into the lungs every 6 (six) hours. 11/28/13   Farrel Gobble, MD  lidocaine-prilocaine (EMLA) cream Apply a quarter size amount to port site 1 hour prior to chemo. Do not rub in. Cover with plastic wrap. 05/21/13   Farrel Gobble, MD  loratadine-pseudoephedrine (CLARITIN-D 24-HOUR) 10-240 MG per 24 hr tablet Take 1 tablet by mouth daily as needed for allergies.    Historical Provider, MD  metoCLOPramide (REGLAN) 10 MG tablet Take 1 tablet (10 mg total) by mouth 4 (four) times daily. 08/06/13   Baird Cancer, PA-C  montelukast (SINGULAIR) 10 MG tablet Take 10 mg by mouth daily as needed (for allergies).     Historical Provider, MD  Multiple Vitamin (MULTIVITAMIN) tablet Take 2 tablets by mouth daily. 2 Multifusion gummies daily    Historical Provider, MD  Nivolumab (OPDIVO IV) Inject into the vein every 14 (fourteen) days. To start 11/22/13 11/22/13   Historical Provider, MD  pravastatin (PRAVACHOL) 20 MG tablet Take 20 mg by mouth daily.    Historical Provider, MD  predniSONE (DELTASONE) 10 MG tablet Take  20 mg by mouth daily with breakfast.    Historical Provider, MD  prochlorperazine (COMPAZINE) 10 MG tablet Take 10 mg by mouth every 6 (six) hours as needed for nausea or vomiting.    Historical Provider, MD  sucralfate (CARAFATE) 1 G tablet Take 1 g by mouth 4 (four) times daily.    Historical Provider, MD  traMADol (ULTRAM) 50 MG tablet Take 1-2 tablets (50-100 mg total) by mouth every 6 (six) hours as needed (pain). 10/11/13   Melrose Nakayama, MD   BP 100/48  Pulse 86  Temp(Src) 98 F (36.7 C) (Oral)  Resp 18  Ht 5\' 2"  (1.575 m)  Wt 99 lb (44.906 kg)  BMI 18.10 kg/m2  SpO2 88% Physical Exam  Nursing note and vitals reviewed. Constitutional: She is oriented to person, place, and time.  Cachectic Answered questions sluggishly   HENT:  Head: Normocephalic and atraumatic.  Eyes: Conjunctivae and EOM are normal. Pupils are equal, round, and reactive to light.  Neck: Normal range of motion. Neck supple.  Cardiovascular: Normal rate, regular rhythm and normal heart sounds.   Pulmonary/Chest: Effort normal and breath sounds normal.  Abdominal: Soft. Bowel sounds are normal.  Musculoskeletal: Normal range of motion.  Neurological: She is alert and oriented to person, place, and time.  Skin: There is pallor.    ED Course  Procedures   DIAGNOSTIC STUDIES:  Oxygen Saturation is 97% on RA, normal by my interpretation.    COORDINATION OF CARE:  9:12 PM Discussed treatment plan with pt at bedside and pt agreed to plan.  Labs Review Labs Reviewed  CBC WITH DIFFERENTIAL - Abnormal; Notable for the following:    RBC 2.80 (*)    Hemoglobin 8.9 (*)    HCT 28.3 (*)    MCV 101.1 (*)    RDW 17.1 (*)    Lymphocytes Relative 11 (*)    Monocytes Relative 13 (*)    All other components within normal limits  COMPREHENSIVE METABOLIC PANEL - Abnormal; Notable for the following:    BUN 37 (*)    Creatinine, Ser 1.76 (*)    Albumin 2.8 (*)    Alkaline Phosphatase 125 (*)    Total  Bilirubin <0.2 (*)    GFR calc non Af Amer 32 (*)    GFR calc Af Amer 38 (*)    All other components within normal limits  CULTURE, BLOOD (ROUTINE X 2)  CULTURE, BLOOD (ROUTINE X 2)  TROPONIN I  URINALYSIS, ROUTINE W REFLEX MICROSCOPIC    Imaging Review Dg Chest Port 1 View  12/24/2013   CLINICAL DATA:  Weakness after chemotherapy.  EXAM: PORTABLE CHEST - 1 VIEW  COMPARISON:  10/11/2013  FINDINGS: There is a left subclavian porta catheter, tip at the upper SVC.  Increased density in the left mid chest, not entirely explained by the patient's recurrent malignancy in this region.  The left apex is obscured by the patient's chin. There is no evidence of pneumothorax or significant pleural effusion. The right lung is well aerated.  Normal heart size.  No acute mediastinal contour abnormality.  IMPRESSION: New left perihilar airspace disease, likely post obstructive pneumonia or pneumonitis.   Electronically Signed   By: Jorje Guild M.D.   On: 12/24/2013 22:15     EKG Interpretation None      MDM   Final diagnoses:  Healthcare-associated pneumonia  Lung cancer, lingula    Immunocompromised patient with lung cancer presents with weakness.  Chest x-ray shows a left perihilar airspace disease. IV hydration, IV vancomycin, IV Maxipime for healthcare associated pneumonia. Discussed with Dr. Ernestina Patches   I personally performed the services described in this documentation, which was scribed in my presence. The recorded information has been reviewed and is accurate.    Nat Christen, MD 12/24/13 [phone]

## 2013-12-24 NOTE — H&P (Addendum)
Hospitalist Admission History and Physical  Patient name: Heather Coffey Medical record number: [id number] Date of birth: 09-23-1960 Age: 53 y.o. Gender: female  Primary Care Provider: Glo Herring., MD  Chief Complaint: HCAP, encephalopathy  History of Present Illness:This is a 53 y.o. year old female with significant past medical history of lung cancer s/p radiation and chemotherapy, tobacco abuse, COPD presenting with HCAP, encephalopathy. Per the husband, patient has been recently restarted on chemotherapy for noted activity with a lymph node on PET scan. Is a stasis since patient started chemotherapy her level of malaise and fatigue is worsened. However, today patient was noted to be progressively weak fatigued and confused. Also mild cough. No known fevers or chills. Patient still smoking. Denies any history of alcohol abuse. Presents to the ER with a MAXIMUM TEMPERATURE of 98, heart rate in the 80s to 90s, respirations and at times, blood pressure in the 80s to 100s (husband states this is her baseline), oxygen saturation in the 80s to 90s on room air. White blood cell count 6.2 hemoglobin 8.9, creatinine 1.76. Troponin less than 0.3. Chest x-ray concerning for new left perihilar postobstructive pneumonia versus pneumonitis. Patient started on vancomycin and cefepime in the ER. Blood cultures obtained.  Assessment and Plan: Heather Coffey is a 53 y.o. year old female presenting with HCAP, encephalopathy  Active Problems:   HCAP (healthcare-associated pneumonia)   Acute encephalopathy   1-HCAP/encephalopathy  -Continue with vancomycin cefepime -Pan culture, urine strep Legionella -Suspect overlapping COPD exacerbation given tobacco abuse  -IV Solu-Medrol -Check MRI and ammonia level -noted LLN BP on admission-family reports this is baseline-IVFs-follow closely. -ICU   2-Lung Ca -currently on chemotherapy in setting of metastatic disease  -consider H-O consult in am   3-COPD   -active smoker with known lung ca -Continue HCAP tx w/ IV solumedrol -prn duonebs   4-Anemia  -likely ACD in setting of Ca and active chemotherapy -progressive decline in hgb  -no active signs of bleeding -other cell lines stable -will trend closely  5-AKI -baseline Cr around 0.9 -suspect prerenal etiology vs. Nephrotoxin -check FeNa  -trend -consider renal ultrasound as clinically indicated. -hydrate-hold nephrotoxic agents   FEN/GI: NPO  Prophylaxis: sub q heparin  Disposition: pending further evaluation  Code Status:Full Code    Patient Active Problem List   Diagnosis Date Noted  . HCAP (healthcare-associated pneumonia) 12/24/2013  . Anemia due to chemotherapy 12/20/2013  . Esophageal dysphagia 09/11/2013  . Odynophagia 09/11/2013  . Lung cancer, lingula 05/17/2013  . 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 Chronic obstructive pulmonary disease 05/17/2013  . Cholelithiasis 05/17/2013  . Abscess of lung(513.0), history of 05/17/2013  . GERD (gastroesophageal reflux disease) 05/17/2013   Past Medical History: Past Medical History  Diagnosis Date  . Arthritis   . Anxiety   . Hyperlipidemia   . Tobacco abuse   . Shortness of breath     Hx: of breath with exertion  . Depression   . GERD (gastroesophageal reflux disease)   . H/O hiatal hernia   . Lung cancer     Past Surgical History: Past Surgical History  Procedure Laterality Date  . Tubal ligation    . Video bronchoscopy with endobronchial ultrasound N/A 05/03/2013    Procedure: VIDEO BRONCHOSCOPY WITH ENDOBRONCHIAL ULTRASOUND;  Surgeon: Melrose Nakayama, MD;  Location: [facility];  Service: Thoracic;  Laterality: N/A;  . Nasal septum surgery    . Portacath placement Left 05/21/2013    Procedure: INSERTION PORT-A-CATH;  Surgeon: Melrose Nakayama, MD;  Location: [facility];  Service: Thoracic;  Laterality: Left;  . Esophagogastroduodenoscopy N/A 09/13/2013    Dr.Rourk- mid esophageal stricture-  likely radiation induced. schatzki's ring, distal one half the tubular esophagus severely inflamed. hiatal hernia bx= squamous mucosa with acute ulcer, candida  . Maloney dilation N/A 09/13/2013    Procedure: Venia Minks DILATION;  Surgeon: Daneil Dolin, MD;  [facility];  Service: Endoscopy;  Laterality: N/A;  . Savory dilation N/A 09/13/2013    Procedure: SAVORY DILATION;  Surgeon: Daneil Dolin, MD;  [facility];  Service: Endoscopy;  Laterality: N/A;  . Breast lumpectomy Right     Hx: of  . Vaginal hysterectomy      still has ovaries per pt  . Port a cath revision Left 10/11/2013    Procedure: LEFT PORT A CATH REVISION;  Surgeon: Melrose Nakayama, MD;  Location: [facility];  Service: Thoracic;  Laterality: Left;  LOCAL ANETHESIA PER DR. HENDRICKSON    Social History: History   Social History  . Marital Status: Married    Spouse Name: N/A    Number of Children: N/A  . Years of Education: N/A   Social History Main Topics  . Smoking status: Current Every Day Smoker -- 0.25 packs/day for 35 years    Types: Cigarettes  . Smokeless tobacco: Never Used  . Alcohol Use: No  . Drug Use: No  . Sexual Activity: None   Other Topics Concern  . None   Social History Narrative  . None    Family History: Family History  Problem Relation Age of Onset  . Cancer Sister     uterine  . Diabetes Sister   . COPD Sister   . COPD Mother   . Hypercholesterolemia Mother   . COPD Father   . COPD Brother   . Colon cancer Neg Hx     Allergies: Allergies  Allergen Reactions  . Augmentin [Amoxicillin-Pot Clavulanate] Other (See Comments)    Metal taste in mouth  . Codeine Diarrhea and Nausea And Vomiting  . Tetracyclines & Related Diarrhea, Nausea And Vomiting and Rash    Pt states n/v/d    Current Facility-Administered Medications  Medication Dose Route Frequency Provider Last Rate Last Dose  . [START ON 12/25/2013] 0.9 %  sodium chloride infusion   Intravenous  Continuous Shanda Howells, MD      . Derrill Memo ON 12/25/2013] ceFEPIme (MAXIPIME) 1 g in dextrose 5 % 50 mL IVPB  1 g Intravenous Q24H Shanda Howells, MD      . Derrill Memo ON 12/25/2013] ceFEPIme (MAXIPIME) 1 g in dextrose 5 % 50 mL IVPB  1 g Intravenous 3 times per day Shanda Howells, MD      . Derrill Memo ON 12/25/2013] heparin injection 5,000 Units  5,000 Units Subcutaneous 3 times per day Shanda Howells, MD      . Derrill Memo ON 12/25/2013] vancomycin (VANCOCIN) 500 mg in sodium chloride 0.9 % 100 mL IVPB  500 mg Intravenous Q24H Shanda Howells, MD      . vancomycin (VANCOCIN) IVPB 1000 mg/200 mL premix  1,000 mg Intravenous Once Shanda Howells, MD 200 mL/hr at 12/24/13 2256 1,000 mg at 12/24/13 2256   Current Outpatient Prescriptions  Medication Sig Dispense Refill  . ALPRAZolam (XANAX) 0.5 MG tablet Take 0.5 mg by mouth 3 (three) times daily.       . benzonatate (TESSALON) 100 MG capsule Take 1 perle up to 4 times daily to  control cough.  120 capsule  6  . cyclobenzaprine (FLEXERIL) 10 MG tablet 10 mg 3 (three) times daily as needed.       . Diphenhyd-Hydrocort-Nystatin (FIRST-DUKES MOUTHWASH) SUSP Use as directed 5 mLs in the mouth or throat 4 (four) times daily as needed (for throat).      Marland Kitchen dronabinol (MARINOL) 2.5 MG capsule Take 1 capsule (2.5 mg total) by mouth 2 (two) times daily before a meal.  30 capsule  5  . esomeprazole (NEXIUM) 40 MG capsule Take 40 mg by mouth daily.      Marland Kitchen FLUoxetine (PROZAC) 20 MG tablet Take 20 mg by mouth daily.      . fluticasone (FLONASE) 50 MCG/ACT nasal spray Place 2 sprays into both nostrils daily as needed for allergies.       . Fluticasone-Salmeterol (ADVAIR) 250-50 MCG/DOSE AEPB Inhale 1 puff into the lungs 2 (two) times daily.      Marland Kitchen ibuprofen (ADVIL,MOTRIN) 200 MG tablet Take 400 mg by mouth every 6 (six) hours as needed.      . Ipratropium-Albuterol (COMBIVENT) 20-100 MCG/ACT AERS respimat Inhale 1 puff into the lungs every 6 (six) hours.  4 g  6  . lidocaine-prilocaine  (EMLA) cream Apply a quarter size amount to port site 1 hour prior to chemo. Do not rub in. Cover with plastic wrap.  30 g  3  . loratadine-pseudoephedrine (CLARITIN-D 24-HOUR) 10-240 MG per 24 hr tablet Take 1 tablet by mouth daily as needed for allergies.      Marland Kitchen metoCLOPramide (REGLAN) 10 MG tablet Take 1 tablet (10 mg total) by mouth 4 (four) times daily.  120 tablet  1  . montelukast (SINGULAIR) 10 MG tablet Take 10 mg by mouth daily as needed (for allergies).       . Multiple Vitamin (MULTIVITAMIN) tablet Take 2 tablets by mouth daily. 2 Multifusion gummies daily      . Nivolumab (OPDIVO IV) Inject into the vein every 14 (fourteen) days. To start 11/22/13      . pravastatin (PRAVACHOL) 20 MG tablet Take 20 mg by mouth daily.      . predniSONE (DELTASONE) 10 MG tablet Take 20 mg by mouth daily with breakfast.      . prochlorperazine (COMPAZINE) 10 MG tablet Take 10 mg by mouth every 6 (six) hours as needed for nausea or vomiting.      . sucralfate (CARAFATE) 1 G tablet Take 1 g by mouth 4 (four) times daily.      . traMADol (ULTRAM) 50 MG tablet Take 1-2 tablets (50-100 mg total) by mouth every 6 (six) hours as needed (pain).  40 tablet  0   Facility-Administered Medications Ordered in Other Encounters  Medication Dose Route Frequency Provider Last Rate Last Dose  . sodium chloride 0.9 % injection 10 mL  10 mL Intracatheter PRN Farrel Gobble, MD   10 mL at 07/04/13 1253   Review Of Systems: 12 point ROS negative except as noted above in HPI.  Physical Exam: Filed Vitals:   12/24/13 2238  BP: 100/48  Pulse: 86  Temp:   Resp: 18    General: somnolent, minimally responsive to questions, but does answer some questions  HEENT: extra ocular movement intact Heart: S1, S2 normal, no murmur, rub or gallop, regular rate and rhythm Lungs: expiratory wheezes and rhonchi throughout both lung fields Abdomen: abdomen is soft without significant tenderness, masses, organomegaly or  guarding Extremities: extremities normal, atraumatic, no cyanosis or edema Skin:no rashes  Neurology: somnolent, minimally cooperative to exam   Labs and Imaging: Lab Results  Component Value Date/Time   NA 138 12/24/2013  9:48 PM   K 3.8 12/24/2013  9:48 PM   CL 104 12/24/2013  9:48 PM   CO2 19 12/24/2013  9:48 PM   BUN 37* 12/24/2013  9:48 PM   CREATININE 1.76* 12/24/2013  9:48 PM   GLUCOSE 97 12/24/2013  9:48 PM   Lab Results  Component Value Date   WBC 6.2 12/24/2013   HGB 8.9* 12/24/2013   HCT 28.3* 12/24/2013   MCV 101.1* 12/24/2013   PLT 320 12/24/2013    Dg Chest Port 1 View  12/24/2013   CLINICAL DATA:  Weakness after chemotherapy.  EXAM: PORTABLE CHEST - 1 VIEW  COMPARISON:  10/11/2013  FINDINGS: There is a left subclavian porta catheter, tip at the upper SVC.  Increased density in the left mid chest, not entirely explained by the patient's recurrent malignancy in this region.  The left apex is obscured by the patient's chin. There is no evidence of pneumothorax or significant pleural effusion. The right lung is well aerated.  Normal heart size.  No acute mediastinal contour abnormality.  IMPRESSION: New left perihilar airspace disease, likely post obstructive pneumonia or pneumonitis.   Electronically Signed   By: Jorje Guild M.D.   On: 12/24/2013 22:15           Shanda Howells MD  Pager: [phone]

## 2013-12-24 NOTE — ED Notes (Signed)
Pt is not responding to family today and has been stumbling around today. Pt had chemo treatment Thursday and since then has been getting weak.

## 2013-12-25 ENCOUNTER — Encounter: Payer: Self-pay | Admitting: Family Medicine

## 2013-12-25 ENCOUNTER — Encounter (HOSPITAL_COMMUNITY): Payer: Self-pay | Admitting: *Deleted

## 2013-12-25 ENCOUNTER — Inpatient Hospital Stay (HOSPITAL_COMMUNITY): Payer: Managed Care, Other (non HMO)

## 2013-12-25 DIAGNOSIS — G9340 Encephalopathy, unspecified: Secondary | ICD-10-CM | POA: Insufficient documentation

## 2013-12-25 DIAGNOSIS — C7931 Secondary malignant neoplasm of brain: Secondary | ICD-10-CM

## 2013-12-25 DIAGNOSIS — C771 Secondary and unspecified malignant neoplasm of intrathoracic lymph nodes: Secondary | ICD-10-CM

## 2013-12-25 DIAGNOSIS — N179 Acute kidney failure, unspecified: Secondary | ICD-10-CM | POA: Diagnosis present

## 2013-12-25 DIAGNOSIS — C3410 Malignant neoplasm of upper lobe, unspecified bronchus or lung: Secondary | ICD-10-CM

## 2013-12-25 DIAGNOSIS — T451X5A Adverse effect of antineoplastic and immunosuppressive drugs, initial encounter: Secondary | ICD-10-CM

## 2013-12-25 DIAGNOSIS — C7949 Secondary malignant neoplasm of other parts of nervous system: Secondary | ICD-10-CM

## 2013-12-25 DIAGNOSIS — J449 Chronic obstructive pulmonary disease, unspecified: Secondary | ICD-10-CM

## 2013-12-25 DIAGNOSIS — R4182 Altered mental status, unspecified: Secondary | ICD-10-CM

## 2013-12-25 DIAGNOSIS — D6481 Anemia due to antineoplastic chemotherapy: Secondary | ICD-10-CM

## 2013-12-25 LAB — RETICULOCYTES
RBC.: 2.55 MIL/uL — ABNORMAL LOW (ref 3.87–5.11)
RETIC COUNT ABSOLUTE: 48.5 10*3/uL (ref 19.0–186.0)
RETIC CT PCT: 1.9 % (ref 0.4–3.1)

## 2013-12-25 LAB — BASIC METABOLIC PANEL
ANION GAP: 12 (ref 5–15)
BUN: 28 mg/dL — ABNORMAL HIGH (ref 6–23)
CO2: 18 meq/L — AB (ref 19–32)
CREATININE: 1.41 mg/dL — AB (ref 0.50–1.10)
Calcium: 9.2 mg/dL (ref 8.4–10.5)
Chloride: 108 mEq/L (ref 96–112)
GFR calc non Af Amer: 42 mL/min — ABNORMAL LOW (ref >90)
GFR, EST AFRICAN AMERICAN: 49 mL/min — AB (ref >90)
Glucose, Bld: 164 mg/dL — ABNORMAL HIGH (ref 70–99)
Potassium: 4.1 mEq/L (ref 3.7–5.3)
SODIUM: 138 meq/L (ref 137–147)

## 2013-12-25 LAB — URINE MICROSCOPIC-ADD ON

## 2013-12-25 LAB — AMMONIA: AMMONIA: 17 umol/L (ref 11–60)

## 2013-12-25 LAB — URINALYSIS, ROUTINE W REFLEX MICROSCOPIC
Bilirubin Urine: NEGATIVE
Glucose, UA: NEGATIVE mg/dL
Ketones, ur: NEGATIVE mg/dL
Leukocytes, UA: NEGATIVE
NITRITE: NEGATIVE
Protein, ur: 30 mg/dL — AB
SPECIFIC GRAVITY, URINE: 1.01 (ref 1.005–1.030)
UROBILINOGEN UA: 0.2 mg/dL (ref 0.0–1.0)
pH: 6 (ref 5.0–8.0)

## 2013-12-25 LAB — CREATININE, URINE, RANDOM: CREATININE, URINE: 49.52 mg/dL

## 2013-12-25 LAB — HIV ANTIBODY (ROUTINE TESTING W REFLEX): HIV 1&2 Ab, 4th Generation: NONREACTIVE

## 2013-12-25 LAB — MRSA PCR SCREENING: MRSA BY PCR: NEGATIVE

## 2013-12-25 LAB — SODIUM, URINE, RANDOM: SODIUM UR: 57 meq/L

## 2013-12-25 LAB — STREP PNEUMONIAE URINARY ANTIGEN: Strep Pneumo Urinary Antigen: NEGATIVE

## 2013-12-25 MED ORDER — LIDOCAINE-PRILOCAINE 2.5-2.5 % EX CREA
TOPICAL_CREAM | Freq: Once | CUTANEOUS | Status: DC
  Filled 2013-12-25 (×2): qty 5

## 2013-12-25 MED ORDER — VANCOMYCIN HCL 500 MG IV SOLR
INTRAVENOUS | Status: AC
  Filled 2013-12-25: qty 500

## 2013-12-25 MED ORDER — HEPARIN SODIUM (PORCINE) 5000 UNIT/ML IJ SOLN: 5000.0000 [IU] | Freq: Three times a day (TID) | INTRAMUSCULAR | Status: DC

## 2013-12-25 MED ORDER — DEXAMETHASONE SODIUM PHOSPHATE 4 MG/ML IJ SOLN
4.0000 mg | Freq: Four times a day (QID) | INTRAMUSCULAR | Status: DC
  Administered 2013-12-25 – 2013-12-26 (×5): 4 mg via INTRAVENOUS
  Filled 2013-12-25 (×5): qty 1

## 2013-12-25 MED ORDER — INFLUENZA VAC SPLIT QUAD 0.5 ML IM SUSY
0.5000 mL | PREFILLED_SYRINGE | INTRAMUSCULAR | Status: AC
  Administered 2013-12-26: 0.5 mL via INTRAMUSCULAR
  Filled 2013-12-25: qty 0.5

## 2013-12-25 NOTE — Progress Notes (Signed)
Oncology consult called this am.

## 2013-12-25 NOTE — Progress Notes (Signed)
Patient transferred to room 311. Report given to Gershon Crane RN. Vital signs stable at transfer.

## 2013-12-25 NOTE — Consult Note (Signed)
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  HISTORY OF PRESENT ILLNESS:   Heather Coffey is a 53 year old white female who is well-known to the [facility] was was initially diagnosed with a Stage III B Squamous Cell Carcinoma treated with concomitant chemoradiation from 05/30/2013- 07/19/2013 with weekly Carboplatin/Paclitaxel with an excellent response by RECIST criteria on repeat CT of chest on 08/16/2013 but now with progression of disease documented on PET scan on 11/09/2013 giving her Stage IV disease.  She was started on second-line therapy for metastatic squamous cell carcinoma with Nivolumab, beginning on 11/22/2013.  She presented to the [facility] ED on 11/23/2013 with altered mental status.      Lung cancer, lingula   04/29/2013 Initial Diagnosis Squamous cell carcinoma   05/30/2013 - 07/04/2013 Chemotherapy Carboplatin/Paclitaxel weekly in combination with radiation therapy   05/30/2013 - 07/19/2013 Radiation Therapy In combination with chemotherapy   11/09/2013 Progression PET scan demonstrate progression   11/22/2013 -  Chemotherapy Nivolumab intravenously every 2 weeks   12/24/2013 [facility] Admission Altered mental status   12/25/2013 Imaging MRI of brain- 1.3 cm left frontal lesion with small amount of surrounding edema, most concerning for metastasis.   I personally reviewed and went over laboratory results with the patient.  The results are noted within this dictation.  I personally reviewed and went over radiographic studies with the patient.  The results are noted within this  dictation.  The patient and family report that this is the first they are hearing about her MRI results at 8:42 AM when the report was signed.   1. 1.3 cm left frontal lesion with small amount of surrounding  edema, most concerning for metastasis. Further evaluation with  post-contrast brain MRI is recommended when patient's renal function  improves.  2. 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  Chart reviewed.  She denies any complaints this afternoon. She denies any pain.  She is slow to respond, but does respond appropriately.  She reports that she is moving bowels and urine appropriately.  She was given an abridged mini-mental exam and she is oriented to time, place, person, and event.  We discussion future options: 1. We need to rule out leptomeningeal involvement as its treatment is different than treatment for a single brain metastasis. 2. We will get radiation oncology involved, either as an outpatient, or as an inpatient (requiring transfer to [facility]) depending on her hospital course over the next 24 hours. 3. Symptomatic management of disease and consequences of CNS involvement.  Oncologically, she denies any complaints and ROS questioning is negative.  PAST MEDICAL HISTORY:   Past Medical History  Diagnosis Date  . Arthritis   . Anxiety   . Hyperlipidemia   . Tobacco abuse   . Shortness of breath     Hx: of breath with exertion  . Depression   . GERD (gastroesophageal reflux disease)   . H/O hiatal hernia   . Lung cancer     ALLERGIES: Allergies  Allergen Reactions  . Augmentin [Amoxicillin-Pot Clavulanate] Other (See Comments)  Metal taste in mouth  . Codeine Diarrhea and Nausea And Vomiting  . Tetracyclines & Related Diarrhea, Nausea And Vomiting and Rash    Pt states n/v/d      MEDICATIONS: I have reviewed the patient's current medications.     PAST SURGICAL HISTORY Past Surgical History  Procedure Laterality Date  . Tubal ligation    . Video  bronchoscopy with endobronchial ultrasound N/A 05/03/2013    Procedure: VIDEO BRONCHOSCOPY WITH ENDOBRONCHIAL ULTRASOUND;  Surgeon: Melrose Nakayama, MD;  Location: [place];  Service: Thoracic;  Laterality: N/A;  . Nasal septum surgery    . Portacath placement Left 05/21/2013    Procedure: INSERTION PORT-A-CATH;  Surgeon: Melrose Nakayama, MD;  Location: [place];  Service: Thoracic;  Laterality: Left;  . 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 schatzki's ring, distal one half the tubular esophagus severely inflamed. hiatal hernia bx= squamous mucosa with acute ulcer, candida  . Maloney dilation N/A 09/13/2013    Procedure: Venia Minks DILATION;  Surgeon: Daneil Dolin, MD;  [facility];  Service: Endoscopy;  Laterality: N/A;  . Savory dilation N/A 09/13/2013    Procedure: SAVORY DILATION;  Surgeon: Daneil Dolin, MD;  [facility];  Service: Endoscopy;  Laterality: N/A;  . Breast lumpectomy Right     Hx: of  . Vaginal hysterectomy      still has ovaries per pt  . Port a cath revision Left 10/11/2013    Procedure: LEFT PORT A CATH REVISION;  Surgeon: Melrose Nakayama, MD;  Location: [place];  Service: Thoracic;  Laterality: Left;  LOCAL ANETHESIA PER DR. HENDRICKSON    FAMILY HISTORY: Family History  Problem Relation Age of Onset  . Cancer Sister     uterine  . Diabetes Sister   . COPD Sister   . COPD Mother   . Hypercholesterolemia Mother   . COPD Father   . COPD Brother   . Colon cancer Neg Hx     SOCIAL HISTORY:  reports that she has been smoking Cigarettes.  She has a 8.75 pack-year smoking history. She has never used smokeless tobacco. She reports that she does not drink alcohol or use illicit drugs.  PERFORMANCE STATUS: The patient's performance status is 4 - Bedbound  PHYSICAL EXAM: Most Recent Vital Signs: Blood pressure 99/48, pulse 84, temperature 98.4 F (36.9 C), temperature  source Oral, resp. rate 19, height _0  (1.651 m), weight 105 lb 6.1 oz (47.8 kg), SpO2 99.00%. General appearance: alert, cooperative, appears older than stated age, no distress and slowed mentation Head: Normocephalic, without obvious abnormality, atraumatic, poor skin color Eyes: negative findings: lids and lashes normal, conjunctivae and sclerae normal and corneas clear Neck: supple, symmetrical, trachea midline Lungs: diminished breath sounds bilaterally Heart: regular rate and rhythm Skin: Skin color, texture, turgor normal. No rashes or lesions or poor skin color, appearing ill Neurologic: Mental status: Alert, oriented, thought content appropriate, affect: blunted and flat, thought content exhibits logical connections  LABORATORY DATA:  Results for orders placed during the hospital encounter of 12/24/13 (from the past 48 hour(s))  CBC WITH DIFFERENTIAL     Status: Abnormal   Collection Time    12/24/13  9:48 PM      Result Value Ref Range   WBC 6.2  4.0 - 10.5 K/uL   RBC 2.80 (*) 3.87 - 5.11 MIL/uL   Hemoglobin 8.9 (*) 12.0 - 15.0 g/dL   HCT 28.3 (*)  36.0 - 46.0 %   MCV 101.1 (*) 78.0 - 100.0 fL   MCH 31.8  26.0 - 34.0 pg   MCHC 31.4  30.0 - 36.0 g/dL   RDW 17.1 (*) 11.5 - 15.5 %   Platelets 320  150 - 400 K/uL   Neutrophils Relative % 74  43 - 77 %   Neutro Abs 4.6  1.7 - 7.7 K/uL   Lymphocytes Relative 11 (*) 12 - 46 %   Lymphs Abs 0.7  0.7 - 4.0 K/uL   Monocytes Relative 13 (*) 3 - 12 %   Monocytes Absolute 0.8  0.1 - 1.0 K/uL   Eosinophils Relative 2  0 - 5 %   Eosinophils Absolute 0.1  0.0 - 0.7 K/uL   Basophils Relative 0  0 - 1 %   Basophils Absolute 0.0  0.0 - 0.1 K/uL  COMPREHENSIVE METABOLIC PANEL     Status: Abnormal   Collection Time    12/24/13  9:48 PM      Result Value Ref Range   Sodium 138  137 - 147 mEq/L   Potassium 3.8  3.7 - 5.3 mEq/L   Chloride 104  96 - 112 mEq/L   CO2 19  19 - 32 mEq/L   Glucose, Bld 97  70 - 99 mg/dL   BUN 37 (*) 6 - 23  mg/dL   Creatinine, Ser 1.76 (*) 0.50 - 1.10 mg/dL   Calcium 10.0  8.4 - 10.5 mg/dL   Total Protein 7.5  6.0 - 8.3 g/dL   Albumin 2.8 (*) 3.5 - 5.2 g/dL   AST 11  0 - 37 U/L   ALT 5  0 - 35 U/L   Alkaline Phosphatase 125 (*) 39 - 117 U/L   Total Bilirubin <0.2 (*) 0.3 - 1.2 mg/dL   GFR calc non Af Amer 32 (*) >90 mL/min   GFR calc Af Amer 38 (*) >90 mL/min   Comment: (NOTE)     The eGFR has been calculated using the CKD EPI equation.     This calculation has not been validated in all clinical situations.     eGFR's persistently <90 mL/min signify possible Chronic Kidney     Disease.   Anion gap 15  5 - 15  TROPONIN I     Status: None   Collection Time    12/24/13  9:48 PM      Result Value Ref Range   Troponin I <0.30  <0.30 ng/mL   Comment:            Due to the release kinetics of cTnI,     a negative result within the first hours     of the onset of symptoms does not rule out     myocardial infarction with certainty.     If myocardial infarction is still suspected,     repeat the test at appropriate intervals.  CULTURE, BLOOD (ROUTINE X 2)     Status: None   Collection Time    12/24/13  9:48 PM      Result Value Ref Range   Specimen Description BLOOD RIGHT ARM     Special Requests       Value: BOTTLES DRAWN AEROBIC AND ANAEROBIC AEB 5CC ANA 3CC   Culture NO GROWTH 1 DAY     Report Status PENDING    CULTURE, BLOOD (ROUTINE X 2)     Status: None   Collection Time    12/24/13 10:12 PM  Result Value Ref Range   Specimen Description BLOOD RIGHT HAND     Special Requests 4CC EACH     Culture NO GROWTH 1 DAY     Report Status PENDING    AMMONIA     Status: None   Collection Time    12/24/13 11:59 PM      Result Value Ref Range   Ammonia 17  11 - 60 umol/L  URINALYSIS, ROUTINE W REFLEX MICROSCOPIC     Status: Abnormal   Collection Time    12/25/13 12:45 AM      Result Value Ref Range   Color, Urine YELLOW  YELLOW   APPearance CLEAR  CLEAR   Specific Gravity,  Urine 1.010  1.005 - 1.030   pH 6.0  5.0 - 8.0   Glucose, UA NEGATIVE  NEGATIVE mg/dL   Hgb urine dipstick TRACE (*) NEGATIVE   Bilirubin Urine NEGATIVE  NEGATIVE   Ketones, ur NEGATIVE  NEGATIVE mg/dL   Protein, ur 30 (*) NEGATIVE mg/dL   Urobilinogen, UA 0.2  0.0 - 1.0 mg/dL   Nitrite NEGATIVE  NEGATIVE   Leukocytes, UA NEGATIVE  NEGATIVE  MRSA PCR SCREENING     Status: None   Collection Time    12/25/13 12:45 AM      Result Value Ref Range   MRSA by PCR NEGATIVE  NEGATIVE   Comment:            The GeneXpert MRSA Assay (FDA     approved for NASAL specimens     only), is one component of a     comprehensive MRSA colonization     surveillance program. It is not     intended to diagnose MRSA     infection nor to guide or     monitor treatment for     MRSA infections.  URINE MICROSCOPIC-ADD ON     Status: Abnormal   Collection Time    12/25/13 12:45 AM      Result Value Ref Range   Squamous Epithelial / LPF FEW (*) RARE   WBC, UA 0-2  <3 WBC/hpf   RBC / HPF 0-2  <3 RBC/hpf   Bacteria, UA MANY (*) RARE   Casts HYALINE CASTS (*) NEGATIVE   Comment: GRANULAR CAST  SODIUM, URINE, RANDOM     Status: None   Collection Time    12/25/13  6:35 AM      Result Value Ref Range   Sodium, Ur 57    CREATININE, URINE, RANDOM     Status: None   Collection Time    12/25/13  6:35 AM      Result Value Ref Range   Creatinine, Urine 62.83    BASIC METABOLIC PANEL     Status: Abnormal   Collection Time    12/25/13 10:00 AM      Result Value Ref Range   Sodium 138  137 - 147 mEq/L   Potassium 4.1  3.7 - 5.3 mEq/L   Chloride 108  96 - 112 mEq/L   CO2 18 (*) 19 - 32 mEq/L   Glucose, Bld 164 (*) 70 - 99 mg/dL   BUN 28 (*) 6 - 23 mg/dL   Creatinine, Ser 1.41 (*) 0.50 - 1.10 mg/dL   Calcium 9.2  8.4 - 10.5 mg/dL   GFR calc non Af Amer 42 (*) >90 mL/min   GFR calc Af Amer 49 (*) >90 mL/min   Comment: (NOTE)     The eGFR  has been calculated using the CKD EPI equation.     This calculation  has not been validated in all clinical situations.     eGFR's persistently <90 mL/min signify possible Chronic Kidney     Disease.   Anion gap 12  5 - 15      RADIOGRAPHY: Mr Brain Wo Contrast  12/25/2013   CLINICAL DATA:  Encephalopathy. Progressive weakness and confusion. History of metastatic lung cancer.  EXAM: MRI HEAD WITHOUT CONTRAST  TECHNIQUE: Multiplanar, multiecho pulse sequences of the brain and surrounding structures were obtained without intravenous contrast.  COMPARISON:  None.  FINDINGS: There is a 1.3 cm lesion in the high left frontal lobe with a small amount of surrounding edema. The lesion demonstrates peripheral increased diffusion-weighted signal without clear restricted diffusion on the ADC map. No definite acute infarct is identified. There is no evidence of intracranial hemorrhage, midline shift, or extra-axial fluid collection. Remote lacunar infarcts are present in the thalami, right larger than left. Multiple remote, small infarcts are also present in both cerebellar hemispheres. There is mild generalized cerebral atrophy.  Orbits are unremarkable. There is mild left maxillary sinus mucosal thickening. Mastoid air cells are clear. Major intracranial vascular flow voids are preserved.  IMPRESSION: 1. 1.3 cm left frontal lesion with small amount of surrounding edema, most concerning for metastasis. Further evaluation with post-contrast brain MRI is recommended when patient's renal function improves. 2. 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   Electronically Signed   By: Logan Bores   On: 12/25/2013 08:42   Dg Chest Port 1 View  12/24/2013   CLINICAL DATA:  Weakness after chemotherapy.  EXAM: PORTABLE CHEST - 1 VIEW  COMPARISON:  10/11/2013  FINDINGS: There is a left subclavian porta catheter, tip at the upper SVC.  Increased density in the left mid chest, not entirely explained by the patient's recurrent malignancy in this region.  The left apex is obscured by  the patient's chin. There is no evidence of pneumothorax or significant pleural effusion. The right lung is well aerated.  Normal heart size.  No acute mediastinal contour abnormality.  IMPRESSION: New left perihilar airspace disease, likely post obstructive pneumonia or pneumonitis.   Electronically Signed   By: Jorje Guild M.D.   On: 12/24/2013 22:15       PATHOLOGY:  Nothing new.    ASSESSMENT:  1. Altered mental status 2. New right fontal lobe metastatic lesion, measuring 1.3 cm with minimal edema effect. 3. Hypercalcemia at 10.96 with corrected calcium level.  Not typically elevated enough to account for #1. 4. Stage IV Squamous Cell Carcinoma of Lung with recent progression in August 2015 after being diagnosed with Stage IIIB disease and a nice response to concomitant chemoradiation consisting of Carboplatin/Paclitaxel weekly with daily radiotherapy finishing in April 2015. 5. DNR    Lung cancer, lingula   04/29/2013 Initial Diagnosis Squamous cell carcinoma   05/30/2013 - 07/04/2013 Chemotherapy Carboplatin/Paclitaxel weekly in combination with radiation therapy   05/30/2013 - 07/19/2013 Radiation Therapy In combination with chemotherapy   11/09/2013 Progression PET scan demonstrate progression   11/22/2013 -  Chemotherapy Nivolumab intravenously every 2 weeks   12/24/2013 [facility] Admission Altered mental status   12/25/2013 Imaging MRI of brain- 1.3 cm left frontal lesion with small amount of surrounding edema, most concerning for metastasis.    PLAN:  1. I personally reviewed and went over laboratory results with the patient.  The results are noted within this dictation. 2. I  personally reviewed and went over radiographic studies with the patient.  The results are noted within this dictation.   3. Chart reviewed 4. Discontinue Solumedrol 5. Dexamethasone 4 mg IV every 6 hours scheduled 6. Dexamethasone should bring Ca++ down.  Do not recommend Pamidronate therapy at this time due  to only minimal elevation of Ca++. 7. Inpatient CSF evaluation with spinal tap to evaluate for leptomeningeal involvement.  Labs to be done: CSF culture, gram stain, CSF cell count, glucose, protein, LDH, Cytology.  Will discontinue Heparin tonight and tomorrow AM and restart following procedure.  Discussed with Dr. Thornton Papas.  Heparin 5,000 unit SQ every 8 hours reordered to start tomorrow at 1300 hrs, may be changed according to attending. 8. Frontal 1.3 brain lesion with minimal edema effect may not be enough to account for mental status change, therefore, it is important to rule out leptomeningeal involvement as treatment would differ significantly. 9. Will evaluate response over the next 24 hours and make further recommendations. 10.  Outpatient versus inpatient Radiation Oncology consultation depending on patient's hospital course over the next 24 hours. 11. Patient wishes to be a DNR.  Order placed after lengthy discussion regarding this code status to update code status. 12. I will copy Dr. Isidore Moos ([place]) 13. Labs today: anemia panel + soluble transferrin receptor.  D/C CBC for tomorrow and ordered CBC diff for tomorrow AM. 14. Will follow along while patient is in the hospital.  All questions were answered. The patient knows to call the clinic with any problems, questions or concerns. We can certainly see the patient much sooner if necessary.  Patient and plan discussed with Dr. Farrel Gobble and he is in agreement with the aforementioned.    KEFALAS,THOMAS 12/25/2013

## 2013-12-25 NOTE — Progress Notes (Signed)
[facility]                                                                              Patient Demographics  Heather Coffey, is a 53 y.o. female, DOB - 1960/07/16, ERD:[id number]  Admit date - 12/24/2013   Admitting Physician Shanda Howells, MD  Outpatient Primary MD for the patient is Glo Herring., MD  LOS - 1   Chief Complaint  Patient presents with  . Altered Mental Status      HPI 12/24/2013 This is a 53 y.o. year old female with significant past medical history of lung cancer s/p radiation and chemotherapy, tobacco abuse, COPD presenting with HCAP, encephalopathy. Per the husband, patient has been recently restarted on chemotherapy for noted activity with a lymph node on PET scan. Since patient started chemotherapy her level of malaise and fatigue have worsened. However, on day of admission, patient was noted to be progressively weak and fatigued fatigued and confused and had mild cough. No known fevers or chills. Patient is still smoking. Denied any history of alcohol abuse.  She presented to the ER with a MAXIMUM TEMPERATURE of 98, heart rate in the 80s to 90s, respirations and at times, blood pressure in the 80s to 100s (husband stated this is her baseline), oxygen saturation in the 80s to 90s on room air. White blood cell count 6.2 hemoglobin 8.9, creatinine 1.76. Troponin less than 0.3. Chest x-ray concerning for new left perihilar postobstructive pneumonia versus pneumonitis. Patient was started on vancomycin and cefepime in the ER. Blood cultures were obtained.   Assessment & Plan   Acute encephalopathy secondary to HCAP -Encephalopathy appears to be resolving, patient is back at her baseline according to husband who is at bedside -Possibly complicated by a recent chemotherapy -Continue vancomycin and cefepime -Urine Legionella and strep pneumonia antigens pending -CXR: New left perihilar airspace disease, likely post obstructive pneumonia or pneumonitis  Lung  cancer -Patient restarted chemotherapy approximately 4 weeks ago -Has received chemotherapy as was radiation in the past -Hematology oncology consulted and appreciated -MRI of the brain: 1.3 cm left frontal lesion with small amount of surrounding edema, concerning for metastasis  Brain lesion -Patient started on Decadron -Currently, patient is neurologically intact -Continue neuro checks every 4 hours -Hematology oncology consulted and appreciated  COPD -Patient is an active smoker with known lung cancer -Continue treatment and plan as above -Continue DuoNeb treatments  Anemia, likely secondary to chronic disease versus chemotherapy -Will continue to monitor hemoglobin, she currently stable -No active signs of bleeding -Continue to monitor CBC  Acute kidney injury -Baseline creatinine 0.9, was 1.76 upon admission -Creatinine improving -Suspect prerenal versus nephrotoxicity from chemotherapy -Urine electrolytes pending -Will continue to monitor BMP -May consider renal ultrasound if no improvement -Continue IVF, avoid nephrotoxic agents  Code Status: Full  Family Communication: Husband at bedside  Disposition Plan: Admitted, will transfer to medical floor  Time Spent in minutes   30 minutes  Procedures  None  Consults   Hematology oncology  DVT Prophylaxis  heparin  Lab Results  Component Value Date   PLT 320 12/24/2013    Medications  Scheduled Meds: . ceFEPime (MAXIPIME) IV  1 g Intravenous  Q24H  . dexamethasone  4 mg Intravenous 4 times per day  . heparin  5,000 Units Subcutaneous 3 times per day  . [START ON 12/26/2013] Influenza vac split quadrivalent PF  0.5 mL Intramuscular Tomorrow-1000  . lidocaine-prilocaine   Topical Once  . vancomycin  500 mg Intravenous Q24H   Continuous Infusions: . sodium chloride 150 mL/hr at 12/25/13 0900   PRN Meds:.  Antibiotics    Anti-infectives   Start     Dose/Rate Route Frequency Ordered Stop   12/25/13 2300   vancomycin (VANCOCIN) 500 mg in sodium chloride 0.9 % 100 mL IVPB     500 mg 100 mL/hr over 60 Minutes Intravenous Every 24 hours 12/24/13 2247     12/25/13 0000  ceFEPIme (MAXIPIME) 1 g in dextrose 5 % 50 mL IVPB     1 g 100 mL/hr over 30 Minutes Intravenous Every 24 hours 12/24/13 2247     12/25/13 0000  ceFEPIme (MAXIPIME) 1 g in dextrose 5 % 50 mL IVPB  Status:  Discontinued     1 g 100 mL/hr over 30 Minutes Intravenous 3 times per day 12/24/13 2349 12/25/13 0026   12/24/13 2300  vancomycin (VANCOCIN) IVPB 1000 mg/200 mL premix     1,000 mg 200 mL/hr over 60 Minutes Intravenous  Once 12/24/13 2247 12/25/13 0011      Subjective:   Heather Coffey seen and examined today.  At this time, patient denies any headache, dizziness, vision changes, chest pain, shortness of breath. She does have cough he continues to feel weak. Patient does respond to questioning however very slow to respond. Per the husband this is her baseline.  Objective:   Filed Vitals:   12/25/13 0600 12/25/13 0620 12/25/13 0700 12/25/13 0738  BP: 83/43 104/57 99/48   Pulse:      Temp:    98.4 F (36.9 C)  TempSrc:    Oral  Resp: 21 18 19    Height:      Weight:      SpO2:        Wt Readings from Last 3 Encounters:  12/25/13 47.8 kg (105 lb 6.1 oz)  12/20/13 44.997 kg (99 lb 3.2 oz)  12/06/13 47.809 kg (105 lb 6.4 oz)     Intake/Output Summary (Last 24 hours) at 12/25/13 1218 Last data filed at 12/25/13 0900  Gross per 24 hour  Intake   1270 ml  Output   1500 ml  Net   -230 ml    Exam  General: Well developed, NAD, appears stated age  HEENT: NCAT, PERRLA, EOMI, Anicteic Sclera, mucous membranes moist.   Cardiovascular: S1 S2 auscultated, no rubs, murmurs or gallops. Regular rate and rhythm.  Respiratory: Expiratory wheezing noted in all lung fields  Abdomen: Soft, nontender, nondistended, + bowel sounds  Extremities: warm dry without cyanosis clubbing or edema  Neuro: AAOx3, slow to respond,  minimally cooperative with exam  Data Review   Micro Results Recent Results (from the past 240 hour(s))  CULTURE, BLOOD (ROUTINE X 2)     Status: None   Collection Time    12/24/13  9:48 PM      Result Value Ref Range Status   Specimen Description BLOOD RIGHT ARM   Final   Special Requests     Final   Value: BOTTLES DRAWN AEROBIC AND ANAEROBIC AEB 5CC ANA 3CC   Culture NO GROWTH 1 DAY   Final   Report Status PENDING   Incomplete  CULTURE, BLOOD (ROUTINE  X 2)     Status: None   Collection Time    12/24/13 10:12 PM      Result Value Ref Range Status   Specimen Description BLOOD RIGHT HAND   Final   Special Requests 4CC EACH   Final   Culture NO GROWTH 1 DAY   Final   Report Status PENDING   Incomplete  MRSA PCR SCREENING     Status: None   Collection Time    12/25/13 12:45 AM      Result Value Ref Range Status   MRSA by PCR NEGATIVE  NEGATIVE Final   Comment:            The GeneXpert MRSA Assay (FDA     approved for NASAL specimens     only), is one component of a     comprehensive MRSA colonization     surveillance program. It is not     intended to diagnose MRSA     infection nor to guide or     monitor treatment for     MRSA infections.    Radiology Reports Mr Brain Wo Contrast  12/25/2013   CLINICAL DATA:  Encephalopathy. Progressive weakness and confusion. History of metastatic lung cancer.  EXAM: MRI HEAD WITHOUT CONTRAST  TECHNIQUE: Multiplanar, multiecho pulse sequences of the brain and surrounding structures were obtained without intravenous contrast.  COMPARISON:  None.  FINDINGS: There is a 1.3 cm lesion in the high left frontal lobe with a small amount of surrounding edema. The lesion demonstrates peripheral increased diffusion-weighted signal without clear restricted diffusion on the ADC map. No definite acute infarct is identified. There is no evidence of intracranial hemorrhage, midline shift, or extra-axial fluid collection. 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 Multiple remote, small infarcts are also present in both cerebellar hemispheres. There is mild generalized cerebral atrophy.  Orbits are unremarkable. There is mild left maxillary sinus mucosal thickening. Mastoid air cells are clear. Major intracranial vascular flow voids are preserved.  IMPRESSION: 1. 1.3 cm left frontal lesion with small amount of surrounding edema, most concerning for metastasis. Further evaluation with post-contrast brain MRI is recommended when patient's renal function improves. 2. 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   Electronically Signed   By: Logan Bores   On: 12/25/2013 08:42   Dg Chest Port 1 View  12/24/2013   CLINICAL DATA:  Weakness after chemotherapy.  EXAM: PORTABLE CHEST - 1 VIEW  COMPARISON:  10/11/2013  FINDINGS: There is a left subclavian porta catheter, tip at the upper SVC.  Increased density in the left mid chest, not entirely explained by the patient's recurrent malignancy in this region.  The left apex is obscured by the patient's chin. There is no evidence of pneumothorax or significant pleural effusion. The right lung is well aerated.  Normal heart size.  No acute mediastinal contour abnormality.  IMPRESSION: New left perihilar airspace disease, likely post obstructive pneumonia or pneumonitis.   Electronically Signed   By: Jorje Guild M.D.   On: 12/24/2013 22:15    CBC  Recent Labs Lab 12/20/13 0938 12/24/13 2148  WBC 6.0 6.2  HGB 9.7* 8.9*  HCT 30.6* 28.3*  PLT 271 320  MCV 100.3* 101.1*  MCH 31.8 31.8  MCHC 31.7 31.4  RDW 16.9* 17.1*  LYMPHSABS 0.6* 0.7  MONOABS 0.8 0.8  EOSABS 0.1 0.1  BASOSABS 0.0 0.0    Chemistries   Recent Labs Lab 12/20/13  7517 12/24/13 2148 12/25/13 1000  NA 138 138 138  K 4.2 3.8 4.1  CL 103 104 108  CO2 21 19 18*  GLUCOSE 107* 97 164*  BUN 29* 37* 28*  CREATININE 1.47* 1.76* 1.41*  CALCIUM 9.9 10.0 9.2  AST 10 11  --   ALT <5 5  --     ALKPHOS 133* 125*  --   BILITOT <0.2* <0.2*  --    ------------------------------------------------------------------------------------------------------------------ estimated creatinine clearance is 35.6 ml/min (by C-G formula based on Cr of 1.41). ------------------------------------------------------------------------------------------------------------------ No results found for this basename: HGBA1C,  in the last 72 hours ------------------------------------------------------------------------------------------------------------------ No results found for this basename: CHOL, HDL, LDLCALC, TRIG, CHOLHDL, LDLDIRECT,  in the last 72 hours ------------------------------------------------------------------------------------------------------------------ No results found for this basename: TSH, T4TOTAL, FREET3, T3FREE, THYROIDAB,  in the last 72 hours ------------------------------------------------------------------------------------------------------------------ No results found for this basename: VITAMINB12, FOLATE, FERRITIN, TIBC, IRON, RETICCTPCT,  in the last 72 hours  Coagulation profile No results found for this basename: INR, PROTIME,  in the last 168 hours  No results found for this basename: DDIMER,  in the last 72 hours  Cardiac Enzymes  Recent Labs Lab 12/24/13 2148  TROPONINI <0.30   ------------------------------------------------------------------------------------------------------------------ No components found with this basename: POCBNP,     Jalia Zuniga D.O. on 12/25/2013 at 12:18 PM  Between 7am to 7pm - Pager - [phone]  After 7pm go to [email or web address] - password [email or web address]  And look for the night coverage person covering for me after hours  [facility] Office  [phone]

## 2013-12-26 ENCOUNTER — Other Ambulatory Visit (HOSPITAL_COMMUNITY): Payer: Self-pay | Admitting: Oncology

## 2013-12-26 ENCOUNTER — Inpatient Hospital Stay (HOSPITAL_COMMUNITY)
Admission: EM | Admit: 2013-12-26 | Discharge: 2013-12-26 | Disposition: A | Discharge status: Home / Self Care | Payer: Managed Care, Other (non HMO) | Source: Home / Self Care | Attending: Oncology | Admitting: Oncology

## 2013-12-26 ENCOUNTER — Other Ambulatory Visit (HOSPITAL_COMMUNITY): Payer: Self-pay

## 2013-12-26 DIAGNOSIS — G9340 Encephalopathy, unspecified: Secondary | ICD-10-CM

## 2013-12-26 DIAGNOSIS — C3410 Malignant neoplasm of upper lobe, unspecified bronchus or lung: Secondary | ICD-10-CM

## 2013-12-26 LAB — CBC WITH DIFFERENTIAL/PLATELET
BASOS ABS: 0 10*3/uL (ref 0.0–0.1)
BASOS PCT: 0 % (ref 0–1)
Eosinophils Absolute: 0 10*3/uL (ref 0.0–0.7)
Eosinophils Relative: 0 % (ref 0–5)
HEMATOCRIT: 24.5 % — AB (ref 36.0–46.0)
Hemoglobin: 7.8 g/dL — ABNORMAL LOW (ref 12.0–15.0)
Lymphocytes Relative: 12 % (ref 12–46)
Lymphs Abs: 0.4 10*3/uL — ABNORMAL LOW (ref 0.7–4.0)
MCH: 32.1 pg (ref 26.0–34.0)
MCHC: 31.8 g/dL (ref 30.0–36.0)
MCV: 100.8 fL — ABNORMAL HIGH (ref 78.0–100.0)
MONO ABS: 0.2 10*3/uL (ref 0.1–1.0)
Monocytes Relative: 6 % (ref 3–12)
NEUTROS ABS: 2.7 10*3/uL (ref 1.7–7.7)
Neutrophils Relative %: 82 % — ABNORMAL HIGH (ref 43–77)
Platelets: 321 10*3/uL (ref 150–400)
RBC: 2.43 MIL/uL — ABNORMAL LOW (ref 3.87–5.11)
RDW: 16.9 % — AB (ref 11.5–15.5)
WBC: 3.3 10*3/uL — ABNORMAL LOW (ref 4.0–10.5)

## 2013-12-26 LAB — PROTEIN, CSF: TOTAL PROTEIN, CSF: 51 mg/dL — AB (ref 15–45)

## 2013-12-26 LAB — CSF CELL COUNT WITH DIFFERENTIAL
RBC Count, CSF: 0 /mm3
TUBE #: 4
WBC CSF: 7 /mm3 — AB (ref 0–5)

## 2013-12-26 LAB — GLUCOSE, CSF: Glucose, CSF: 78 mg/dL — ABNORMAL HIGH (ref 43–76)

## 2013-12-26 LAB — BASIC METABOLIC PANEL
Anion gap: 13 (ref 5–15)
BUN: 25 mg/dL — AB (ref 6–23)
CALCIUM: 9.2 mg/dL (ref 8.4–10.5)
CO2: 17 mEq/L — ABNORMAL LOW (ref 19–32)
CREATININE: 1.34 mg/dL — AB (ref 0.50–1.10)
Chloride: 110 mEq/L (ref 96–112)
GFR calc non Af Amer: 45 mL/min — ABNORMAL LOW (ref >90)
GFR, EST AFRICAN AMERICAN: 52 mL/min — AB (ref >90)
Glucose, Bld: 125 mg/dL — ABNORMAL HIGH (ref 70–99)
Potassium: 4.4 mEq/L (ref 3.7–5.3)
Sodium: 140 mEq/L (ref 137–147)

## 2013-12-26 LAB — LEGIONELLA ANTIGEN, URINE: Legionella Antigen, Urine: NEGATIVE

## 2013-12-26 LAB — IRON AND TIBC
Iron: 36 ug/dL — ABNORMAL LOW (ref 42–135)
Saturation Ratios: 17 % — ABNORMAL LOW (ref 20–55)
TIBC: 212 ug/dL — ABNORMAL LOW (ref 250–470)
UIBC: 176 ug/dL (ref 125–400)

## 2013-12-26 LAB — FOLATE: Folate: 2.8 ng/mL — ABNORMAL LOW

## 2013-12-26 LAB — LACTATE DEHYDROGENASE, PLEURAL OR PERITONEAL FLUID: LD, Fluid: 36 U/L — ABNORMAL HIGH (ref 3–23)

## 2013-12-26 LAB — FERRITIN: FERRITIN: 144 ng/mL (ref 10–291)

## 2013-12-26 LAB — VITAMIN B12: Vitamin B-12: 195 pg/mL — ABNORMAL LOW (ref 211–911)

## 2013-12-26 MED ORDER — ONDANSETRON HCL 4 MG/5ML PO SOLN: 4.0000 mg | Freq: Three times a day (TID) | ORAL | Status: DC | PRN

## 2013-12-26 MED ORDER — ALPRAZOLAM 0.5 MG PO TABS
0.5000 mg | ORAL_TABLET | Freq: Three times a day (TID) | ORAL | Status: DC | PRN
  Administered 2013-12-26: 0.5 mg via ORAL
  Filled 2013-12-26: qty 1

## 2013-12-26 MED ORDER — ONDANSETRON HCL 4 MG PO TABS: 4.0000 mg | ORAL_TABLET | Freq: Three times a day (TID) | ORAL | Status: DC | PRN

## 2013-12-26 MED ORDER — ONDANSETRON HCL 4 MG/5ML PO SOLN
4.0000 mg | Freq: Three times a day (TID) | ORAL | Status: DC | PRN
  Filled 2013-12-26: qty 5

## 2013-12-26 MED ORDER — FOLIC ACID 1 MG PO TABS
1.0000 mg | ORAL_TABLET | Freq: Every day | ORAL | Status: DC
  Administered 2013-12-26: 1 mg via ORAL
  Filled 2013-12-26: qty 1

## 2013-12-26 MED ORDER — DEXAMETHASONE 4 MG PO TABS: 4.0000 mg | ORAL_TABLET | Freq: Two times a day (BID) | ORAL | Status: AC

## 2013-12-26 MED ORDER — LEVOFLOXACIN 750 MG PO TABS: 750.0000 mg | ORAL_TABLET | Freq: Every day | ORAL | Status: DC

## 2013-12-26 MED ORDER — FOLIC ACID 1 MG PO TABS: 1.0000 mg | ORAL_TABLET | Freq: Every day | ORAL | Status: AC

## 2013-12-26 MED ORDER — ONDANSETRON HCL 4 MG PO TABS
4.0000 mg | ORAL_TABLET | Freq: Three times a day (TID) | ORAL | Status: DC | PRN
  Administered 2013-12-26: 4 mg via ORAL
  Filled 2013-12-26: qty 1

## 2013-12-26 NOTE — Progress Notes (Signed)
Lumbar puncture complete no signs of distress

## 2013-12-26 NOTE — Discharge Instructions (Signed)

## 2013-12-26 NOTE — Care Management Note (Signed)
    Page 1 of 1   12/26/2013     2:34:39 PM CARE MANAGEMENT NOTE 12/26/2013  Patient:  Heather Coffey, Heather Coffey   Account Number:  [id number]  Date Initiated:  12/26/2013  Documentation initiated by:  Theophilus Kinds  Subjective/Objective Assessment:   Pt admitted from home with pnuemonia and new brain mets. Pt lives with her daughter and will return home at discharge. Pt is independent with ADL's.     Action/Plan:   Pt discharged home today. No CM needs noted.   Anticipated DC Date:  12/26/2013   Anticipated DC Plan:  [place]  CM consult      Choice offered to / List presented to:             Status of service:  Completed, signed off Medicare Important Message given?   (If response is "NO", the following Medicare IM given date fields will be blank) Date Medicare IM given:   Medicare IM given by:   Date Additional Medicare IM given:   Additional Medicare IM given by:    Discharge Disposition:  HOME/SELF CARE  Per UR Regulation:    If discussed at Long Length of Stay Meetings, dates discussed:    Comments:  12/26/13 [place], RN BSN CM

## 2013-12-26 NOTE — Discharge Summary (Signed)
Physician Discharge Summary  Heather Coffey:[id number] DOB: Jul 08, 1960 DOA: 12/24/2013  PCP: Glo Herring., MD  Admit date: 12/24/2013 Discharge date: 12/26/2013  Time spent: 45 minutes  Recommendations for Outpatient Follow-up:  Patient will be discharged to home. She is to followup with Dr. Isidore Moos, radiation oncologist as well as her primary care physician. Patient will need to have a CBC as well as BMP within one week of discharge. Patient should also keep her appointments with the oncology office for her chemotherapy appointments in [place]. Patient should continue her medications as prescribed. Patient should continue a regular diet, and continue activity as tolerated.  Discharge Diagnoses:  Acute encephalopathy secondary to HCAP Lung cancer Brain lesion COPD Macrocytic Anemia Acute kidney injury  Discharge Condition: Stable  Diet recommendation: Regular  CODE STATUS: DO NOT RESUSCITATE  Filed Weights   12/24/13 2104 12/25/13 0054  Weight: 44.906 kg (99 lb) 47.8 kg (105 lb 6.1 oz)    History of present illness:  On 12/24/2013  This is a 53 y.o. year old female with significant past medical history of lung cancer s/p radiation and chemotherapy, tobacco abuse, COPD presenting with HCAP, encephalopathy. Per the husband, patient has been recently restarted on chemotherapy for noted activity with a lymph node on PET scan. Since patient started chemotherapy her level of malaise and fatigue have worsened. However, on day of admission, patient was noted to be progressively weak and fatigued fatigued and confused and had mild cough. No known fevers or chills. Patient is still smoking. Denied any history of alcohol abuse. She presented to the ER with a MAXIMUM TEMPERATURE of 98, heart rate in the 80s to 90s, respirations and at times, blood pressure in the 80s to 100s (husband stated this is her baseline), oxygen saturation in the 80s to 90s on room air. White blood cell count 6.2  hemoglobin 8.9, creatinine 1.76. Troponin less than 0.3. Chest x-ray concerning for new left perihilar postobstructive pneumonia versus pneumonitis. Patient was started on vancomycin and cefepime in the ER. Blood cultures were obtained.  Hospital Course:  Acute encephalopathy secondary to HCAP  -Encephalopathy resolved, patient is back at her baseline  -Possibly complicated by a recent chemotherapy vs new brain lesion vs infectious etiology -CXR: New left perihilar airspace disease, likely post obstructive pneumonia or pneumonitis  -Initially placed on vancomycin and cefepime , will discharge on levaquin -Urine Legionella and strep pneumonia antigens negative  Lung cancer  -Patient restarted chemotherapy approximately 4 weeks ago  -Has received chemotherapy as was radiation in the past  -Hematology oncology consulted and appreciated and recommended LP -MRI of the brain: 1.3 cm left frontal lesion with small amount of surrounding edema, concerning for metastasis  -LP results pending and will need to be reviewed by oncology  Brain lesion  -Patient started on Decadron, will discharge her with 4mg  BID (as recommended by Oncology)  -Currently, patient is neurologically intact  -Hematology oncology consulted and appreciated  And recommended outpatient radiation oncology followup with Dr. Isidore Moos for SBRT  COPD  -Patient is an active smoker with known lung cancer  -Continue treatment and plan as above  -Continue DuoNeb treatments   Anemia, macrocytic -May be complicated by chemotherapy versus chronic disease -Will continue to monitor hemoglobin, she currently stable  -No active signs of bleeding  -Anemia panel: Adequate iron and ferritin, low folate 2.8 -Patient was started on folate replacement.  Acute kidney injury  -Baseline creatinine 0.9, was 1.76 upon admission  -Creatinine improving  -Suspect prerenal versus  nephrotoxicity from chemotherapy  -Patient should have a BMP within one  week of discharge  Procedures: Lumbar puncture  Consultations: Oncology/Hematology Radiology  Discharge Exam: Filed Vitals:   12/26/13 1405  BP: 123/63  Pulse: 63  Temp: 98.4 F (36.9 C)  Resp: 18   Exam  General: Well developed, NAD, appears stated age  HEENT: NCAT, mucous membranes moist.  Cardiovascular: S1 S2 auscultated, no rubs, murmurs or gallops. Regular rate and rhythm.  Respiratory: Few Expiratory wheezes noted in all lung fields  Abdomen: Soft, nontender, nondistended, + bowel sounds  Extremities: warm dry without cyanosis clubbing or edema  Neuro: AAOx3, no focal deficits Psych: Normal affect and demeanor with intact judgement and insight  Discharge Instructions      Discharge Instructions   CSF culture    Complete by:  Dec 25, 2013      Gram stain (required with CSF Culture)    Complete by:  Dec 25, 2013      Discharge instructions    Complete by:  As directed   Patient will be discharged to home. She is to followup with Dr. Isidore Moos, radiation oncologist as well as her primary care physician. Patient will need to have a CBC as well as BMP within one week of discharge. Patient should also keep her appointments with the oncology office for her chemotherapy appointments in [place]. Patient should continue her medications as prescribed. Patient should continue a regular diet, and continue activity as tolerated.     Increase activity slowly    Complete by:  As directed             Medication List    STOP taking these medications       predniSONE 10 MG tablet  Commonly known as:  DELTASONE      TAKE these medications       ALPRAZolam 0.5 MG tablet  Commonly known as:  XANAX  Take 0.5 mg by mouth 3 (three) times daily.     benzonatate 100 MG capsule  Commonly known as:  TESSALON  Take 1 perle up to 4 times daily to control cough.     cyclobenzaprine 10 MG tablet  Commonly known as:  FLEXERIL  10 mg 3 (three) times daily as needed.      dexamethasone 4 MG tablet  Commonly known as:  DECADRON  Take 1 tablet (4 mg total) by mouth 2 (two) times daily.     dronabinol 2.5 MG capsule  Commonly known as:  MARINOL  Take 1 capsule (2.5 mg total) by mouth 2 (two) times daily before a meal.     esomeprazole 40 MG capsule  Commonly known as:  NEXIUM  Take 40 mg by mouth daily.     FIRST-DUKES MOUTHWASH Susp  Use as directed 5 mLs in the mouth or throat 4 (four) times daily as needed (for throat).     FLUoxetine 20 MG tablet  Commonly known as:  PROZAC  Take 20 mg by mouth daily.     fluticasone 50 MCG/ACT nasal spray  Commonly known as:  FLONASE  Place 2 sprays into both nostrils daily as needed for allergies.     Fluticasone-Salmeterol 250-50 MCG/DOSE Aepb  Commonly known as:  ADVAIR  Inhale 1 puff into the lungs 2 (two) times daily.     folic acid 1 MG tablet  Commonly known as:  FOLVITE  Take 1 tablet (1 mg total) by mouth daily.     ibuprofen 200 MG tablet  Commonly known as:  ADVIL,MOTRIN  Take 400 mg by mouth every 6 (six) hours as needed.     Ipratropium-Albuterol 20-100 MCG/ACT Aers respimat  Commonly known as:  COMBIVENT  Inhale 1 puff into the lungs every 6 (six) hours.     levofloxacin 750 MG tablet  Commonly known as:  LEVAQUIN  Take 1 tablet (750 mg total) by mouth daily.     lidocaine-prilocaine cream  Commonly known as:  EMLA  Apply a quarter size amount to port site 1 hour prior to chemo. Do not rub in. Cover with plastic wrap.     loratadine-pseudoephedrine 10-240 MG per 24 hr tablet  Commonly known as:  CLARITIN-D 24-hour  Take 1 tablet by mouth daily as needed for allergies.     montelukast 10 MG tablet  Commonly known as:  SINGULAIR  Take 10 mg by mouth daily as needed (for allergies).     multivitamin tablet  Take 2 tablets by mouth daily. 2 Multifusion gummies daily     OPDIVO IV  Inject into the vein every 14 (fourteen) days. To start 11/22/13     pravastatin 20 MG tablet    Commonly known as:  PRAVACHOL  Take 20 mg by mouth at bedtime.     sucralfate 1 G tablet  Commonly known as:  CARAFATE  Take 1 g by mouth 4 (four) times daily.     traMADol 50 MG tablet  Commonly known as:  ULTRAM  Take 1-2 tablets (50-100 mg total) by mouth every 6 (six) hours as needed (pain).       Allergies  Allergen Reactions  . Augmentin [Amoxicillin-Pot Clavulanate] Other (See Comments)    Metal taste in mouth  . Codeine Diarrhea and Nausea And Vomiting  . Tetracyclines & Related Diarrhea, Nausea And Vomiting and Rash    Pt states n/v/d   Follow-up Information   Follow up with Glo Herring., MD On 12/28/2013. (at 11:30 am)    Specialty:  Internal Medicine   Contact information:   [address] [place] [address] [phone]       Follow up with Acquanetta Belling, MD. Schedule an appointment as soon as possible for a visit on 12/26/2013. (Schedule for SBRT (per oncology))    Specialty:  Radiation Oncology   Contact information:   [address] [address] [phone]        The results of significant diagnostics from this hospitalization (including imaging, microbiology, ancillary and laboratory) are listed below for reference.    Significant Diagnostic Studies: Mr Brain Wo Contrast  12/25/2013   CLINICAL DATA:  Encephalopathy. Progressive weakness and confusion. History of metastatic lung cancer.  EXAM: MRI HEAD WITHOUT CONTRAST  TECHNIQUE: Multiplanar, multiecho pulse sequences of the brain and surrounding structures were obtained without intravenous contrast.  COMPARISON:  None.  FINDINGS: There is a 1.3 cm lesion in the high left frontal lobe with a small amount of surrounding edema. The lesion demonstrates peripheral increased diffusion-weighted signal without clear restricted diffusion on the ADC map. No definite acute infarct is identified. There is no evidence of intracranial hemorrhage, midline shift, or extra-axial fluid  collection. Remote lacunar infarcts are present in the thalami, right larger than left. Multiple remote, small infarcts are also present in both cerebellar hemispheres. There is mild generalized cerebral atrophy.  Orbits are unremarkable. There is mild left maxillary sinus mucosal thickening. Mastoid air cells are clear. Major intracranial vascular flow voids are preserved.  IMPRESSION: 1. 1.3 cm left  frontal lesion with small amount of surrounding edema, most concerning for metastasis. Further evaluation with post-contrast brain MRI is recommended when patient's renal function improves. 2. 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   Electronically Signed   By: Logan Bores   On: 12/25/2013 08:42   Dg Chest Port 1 View  12/24/2013   CLINICAL DATA:  Weakness after chemotherapy.  EXAM: PORTABLE CHEST - 1 VIEW  COMPARISON:  10/11/2013  FINDINGS: There is a left subclavian porta catheter, tip at the upper SVC.  Increased density in the left mid chest, not entirely explained by the patient's recurrent malignancy in this region.  The left apex is obscured by the patient's chin. There is no evidence of pneumothorax or significant pleural effusion. The right lung is well aerated.  Normal heart size.  No acute mediastinal contour abnormality.  IMPRESSION: New left perihilar airspace disease, likely post obstructive pneumonia or pneumonitis.   Electronically Signed   By: Jorje Guild M.D.   On: 12/24/2013 22:15   Dg Fluoro Guide Lumbar Puncture  12/26/2013   CLINICAL DATA:  Lung cancer, acute encephalopathy  EXAM: DIAGNOSTIC LUMBAR PUNCTURE UNDER FLUOROSCOPIC GUIDANCE  FLUOROSCOPY TIME:  0 min 36 seconds  PROCEDURE: Procedure, benefits, and risks were discussed with the patient, including alternatives.  Patient's questions were answered.  Written informed consent was obtained.  Timeout protocol followed.  Patient placed prone.  L3-L4 disc space was localized under fluoroscopy.  Skin prepped and draped in  usual sterile fashion.  Skin and soft tissues anesthetized with 2 mL of 1% lidocaine.  22 gauge needle was advanced into the spinal canal where clear colorless CSF was encountered with an opening pressure of 3 cm H2O (prone).  10 mL of CSF was obtained in 4 tubes for requested analysis.  Procedure tolerated very well by patient without immediate complication.  IMPRESSION: Lumbar puncture as above.   Electronically Signed   By: Lavonia Dana M.D.   On: 12/26/2013 10:53    Microbiology: Recent Results (from the past 240 hour(s))  CULTURE, BLOOD (ROUTINE X 2)     Status: None   Collection Time    12/24/13  9:48 PM      Result Value Ref Range Status   Specimen Description BLOOD RIGHT ARM DRAWN BY RN   Final   Special Requests     Final   Value: BOTTLES DRAWN AEROBIC AND ANAEROBIC AEB=5CC ANA=3CC   Culture NO GROWTH 2 DAYS   Final   Report Status PENDING   Incomplete  CULTURE, BLOOD (ROUTINE X 2)     Status: None   Collection Time    12/24/13 10:12 PM      Result Value Ref Range Status   Specimen Description BLOOD RIGHT HAND   Final   Special Requests BOTTLES DRAWN AEROBIC AND ANAEROBIC 4CC   Final   Culture NO GROWTH 2 DAYS   Final   Report Status PENDING   Incomplete  MRSA PCR SCREENING     Status: None   Collection Time    12/25/13 12:45 AM      Result Value Ref Range Status   MRSA by PCR NEGATIVE  NEGATIVE Final   Comment:            The GeneXpert MRSA Assay (FDA     approved for NASAL specimens     only), is one component of a     comprehensive MRSA colonization     surveillance program. It is not  intended to diagnose MRSA     infection nor to guide or     monitor treatment for     MRSA infections.  CSF CULTURE     Status: None   Collection Time    12/26/13 10:10 AM      Result Value Ref Range Status   Specimen Description CSF   Final   Special Requests NONE   Final   Gram Stain     Final   Value: CYTOSPIN = WBC PRESENT, PREDOMINANTLY MONONUCLEAR NO ORGANISMS SEEN      Performed at [facility]   Culture PENDING   Incomplete   Report Status PENDING   Incomplete     Labs: Basic Metabolic Panel:  Recent Labs Lab 12/20/13 0938 12/24/13 2148 12/25/13 1000 12/26/13 0558  NA 138 138 138 140  K 4.2 3.8 4.1 4.4  CL 103 104 108 110  CO2 21 19 18* 17*  GLUCOSE 107* 97 164* 125*  BUN 29* 37* 28* 25*  CREATININE 1.47* 1.76* 1.41* 1.34*  CALCIUM 9.9 10.0 9.2 9.2   Liver Function Tests:  Recent Labs Lab 12/20/13 0938 12/24/13 2148  AST 10 11  ALT <5 5  ALKPHOS 133* 125*  BILITOT <0.2* <0.2*  PROT 7.6 7.5  ALBUMIN 2.8* 2.8*   No results found for this basename: LIPASE, AMYLASE,  in the last 168 hours  Recent Labs Lab 12/24/13 2359  AMMONIA 17   CBC:  Recent Labs Lab 12/20/13 0938 12/24/13 2148 12/26/13 0558  WBC 6.0 6.2 3.3*  NEUTROABS 4.6 4.6 2.7  HGB 9.7* 8.9* 7.8*  HCT 30.6* 28.3* 24.5*  MCV 100.3* 101.1* 100.8*  PLT 271 320 321   Cardiac Enzymes:  Recent Labs Lab 12/24/13 2148  TROPONINI <0.30   BNP: BNP (last 3 results) No results found for this basename: PROBNP,  in the last 8760 hours CBG: No results found for this basename: GLUCAP,  in the last 168 hours     Signed:  Cristal Ford  [facility] 12/26/2013, 2:19 PM

## 2013-12-26 NOTE — Procedures (Signed)
Preprocedure Dx: Lung cancer, acute encephalopathy Postprocedure Dx: Lung cancer, acute encephalopathy Procedure:  Fluoroscopically guided lumbar puncture Radiologist:  Thornton Papas Anesthesia:  2 ml of 1% lidocaine Specimen:  1) ml CSF, clear colorless EBL:   < 1 ml Opening pressure: 3 cm L8G Complications: None - tolerated very well by patient

## 2013-12-27 NOTE — Progress Notes (Signed)
Location/Histology of Brain Tumor: Brain Metastases -Left Frontal Lobe. Primary - Squamous Cell Carcinoma of the Lung  Patient presented with symptoms of: She presented to the ED on 12/24/2013 with altered mental status, weakness, and falls x 2 and she "fell shaky" wih generalized weakness reported.    12/25/13 -MRI Head without contrast- 1.3 cm lesion in the high, left frontal lobe with a small amount of surrounding edema.   Diagnosed with a Stage III B Squamous Cell Carcinoma treated with concomitant chemoradiation from 05/30/2013- 07/19/2013 with weekly Carboplatin/Paclitaxel with an excellent response by RECIST criteria on repeat CT of chest on 08/16/2013 but now with progression of disease documented on PET scan on 11/09/2013 giving her Stage IV disease. She was started on second-line therapy for metastatic squamous cell carcinoma with Nivolumab, beginning on 11/22/2013.  She presented to the [facility] ED on 11/23/2013 with altered mental status. concomitant chemoradiation from 05/30/2013- 07/19/2013 with weekly Carboplatin/Paclitaxel.    Past or anticipated interventions, if any, per neurosurgery: ? SRS  Past or anticipated interventions, if any, per medical oncology: concomitant chemoradiation from 05/30/2013- 07/19/2013 with weekly Carboplatin/Paclitaxel, and second-line therapy for metastatic squamous cell carcinoma with Nivolumab, beginning on 11/22/2013   Dose of Decadron, if applicable: 4 mg po BID  Recent neurologic symptoms, if any:   Seizures:No  Headaches:   Nausea:   Dizziness/ataxia: 12/24/13 [address] x 2 due to weakness   Difficulty with hand coordination:   Focal numbness/weakness:Progressive generalized weakness - Encephalopathy resolved   Visual deficits/changes:   Confusion/Memory deficits: Documented on 12/26/13  Painful bone metastases at present, if any:   SAFETY ISSUES:  Prior radiation? Completed 66 Gy in 33 Fractions 05/30/13 -07/19/13 - lung  Pacemaker/ICD?  No  Possible current pregnancy?No  Is the patient on methotrexate? No  Additional Complaints / other details: Active Smoker

## 2013-12-28 ENCOUNTER — Ambulatory Visit: Payer: Managed Care, Other (non HMO)

## 2013-12-28 ENCOUNTER — Ambulatory Visit
Admission: RE | Admit: 2013-12-28 | Discharge: 2013-12-28 | Disposition: A | Discharge status: Home / Self Care | Payer: Managed Care, Other (non HMO) | Source: Ambulatory Visit | Attending: Radiation Oncology | Admitting: Radiation Oncology

## 2013-12-28 ENCOUNTER — Telehealth: Payer: Self-pay | Admitting: *Deleted

## 2013-12-28 LAB — SOLUBLE TRANSFERRIN RECEPTOR: Transferrin Receptor, Soluble: 2.13 mg/L — ABNORMAL HIGH (ref 0.76–1.76)

## 2013-12-28 NOTE — Telephone Encounter (Signed)
Received call from patient who states her husband is not going to bring her in today for her appointment due to the weather. She also states she has pneumonia and does not want to get out in the weather. She states she lives closer to [facility] and wishes to see Dr Isidore Moos next week when Dr Isidore Moos is in the [facility] cancer center. Informed Ms Poche that her request will be routed to Dr Isidore Moos, and she will receive a call back today. Pt states her best contact # [phone].

## 2013-12-29 LAB — CULTURE, BLOOD (ROUTINE X 2)
Culture: NO GROWTH
Culture: NO GROWTH

## 2013-12-29 LAB — CSF CULTURE W GRAM STAIN: Culture: NO GROWTH

## 2013-12-29 LAB — CSF CULTURE

## 2014-01-01 ENCOUNTER — Encounter: Payer: Self-pay | Admitting: Radiation Therapy

## 2014-01-01 ENCOUNTER — Other Ambulatory Visit (HOSPITAL_COMMUNITY): Payer: Self-pay | Admitting: Oncology

## 2014-01-01 ENCOUNTER — Other Ambulatory Visit: Payer: Self-pay | Admitting: Radiation Therapy

## 2014-01-01 DIAGNOSIS — E611 Iron deficiency: Secondary | ICD-10-CM

## 2014-01-01 DIAGNOSIS — K909 Intestinal malabsorption, unspecified: Secondary | ICD-10-CM

## 2014-01-01 DIAGNOSIS — C7931 Secondary malignant neoplasm of brain: Secondary | ICD-10-CM

## 2014-01-01 DIAGNOSIS — C7949 Secondary malignant neoplasm of other parts of nervous system: Principal | ICD-10-CM

## 2014-01-01 NOTE — Progress Notes (Signed)
Heather Coffey was seen in [place] today by Dr. Isidore Moos. Based on their discussion, Dr. Isidore Moos requested a 3T SRS Protocol Brain MRI be scheduled here in [place]. This is scheduled with [place] [facility] on [address] for Thursday 10/1 @ 10:45 am. I left a voicemail on Heather Coffey' cell phone letting her know the time and requesting a return call for confirmation   Mont Dutton

## 2014-01-02 ENCOUNTER — Telehealth (HOSPITAL_COMMUNITY): Payer: Self-pay | Admitting: Hematology and Oncology

## 2014-01-02 NOTE — Telephone Encounter (Signed)
Spoke with the patient today and encourage her to undergo MRI for anticipated SBRT to brain metastases. She was told that Nivolumab to be continued during that therapy. She does have an appointment on 01/04/2014 at which time she will either see Dr. Bubba Hales or Kirby Crigler.

## 2014-01-03 ENCOUNTER — Other Ambulatory Visit: Payer: Managed Care, Other (non HMO)

## 2014-01-04 ENCOUNTER — Other Ambulatory Visit (HOSPITAL_COMMUNITY): Payer: Self-pay | Admitting: Oncology

## 2014-01-04 ENCOUNTER — Encounter (HOSPITAL_BASED_OUTPATIENT_CLINIC_OR_DEPARTMENT_OTHER): Payer: Managed Care, Other (non HMO)

## 2014-01-04 ENCOUNTER — Encounter (HOSPITAL_COMMUNITY): Payer: Managed Care, Other (non HMO) | Attending: Hematology and Oncology

## 2014-01-04 ENCOUNTER — Encounter (HOSPITAL_COMMUNITY): Payer: Self-pay

## 2014-01-04 VITALS — BP 101/51 | HR 73 | Temp 98.1°F | Resp 18 | Wt 108.0 lb

## 2014-01-04 DIAGNOSIS — C771 Secondary and unspecified malignant neoplasm of intrathoracic lymph nodes: Secondary | ICD-10-CM

## 2014-01-04 DIAGNOSIS — D509 Iron deficiency anemia, unspecified: Secondary | ICD-10-CM

## 2014-01-04 DIAGNOSIS — C7802 Secondary malignant neoplasm of left lung: Secondary | ICD-10-CM | POA: Insufficient documentation

## 2014-01-04 DIAGNOSIS — Z79899 Other long term (current) drug therapy: Secondary | ICD-10-CM | POA: Insufficient documentation

## 2014-01-04 DIAGNOSIS — T451X5A Adverse effect of antineoplastic and immunosuppressive drugs, initial encounter: Secondary | ICD-10-CM

## 2014-01-04 DIAGNOSIS — E785 Hyperlipidemia, unspecified: Secondary | ICD-10-CM | POA: Insufficient documentation

## 2014-01-04 DIAGNOSIS — K219 Gastro-esophageal reflux disease without esophagitis: Secondary | ICD-10-CM | POA: Insufficient documentation

## 2014-01-04 DIAGNOSIS — C3410 Malignant neoplasm of upper lobe, unspecified bronchus or lung: Secondary | ICD-10-CM

## 2014-01-04 DIAGNOSIS — C3412 Malignant neoplasm of upper lobe, left bronchus or lung: Secondary | ICD-10-CM | POA: Insufficient documentation

## 2014-01-04 DIAGNOSIS — D6481 Anemia due to antineoplastic chemotherapy: Secondary | ICD-10-CM

## 2014-01-04 DIAGNOSIS — Z5181 Encounter for therapeutic drug level monitoring: Secondary | ICD-10-CM | POA: Insufficient documentation

## 2014-01-04 DIAGNOSIS — Z5112 Encounter for antineoplastic immunotherapy: Secondary | ICD-10-CM

## 2014-01-04 DIAGNOSIS — E876 Hypokalemia: Secondary | ICD-10-CM

## 2014-01-04 DIAGNOSIS — E611 Iron deficiency: Secondary | ICD-10-CM

## 2014-01-04 DIAGNOSIS — K909 Intestinal malabsorption, unspecified: Secondary | ICD-10-CM

## 2014-01-04 DIAGNOSIS — C7801 Secondary malignant neoplasm of right lung: Secondary | ICD-10-CM | POA: Insufficient documentation

## 2014-01-04 LAB — CBC WITH DIFFERENTIAL/PLATELET
BASOS ABS: 0 10*3/uL (ref 0.0–0.1)
Basophils Relative: 0 % (ref 0–1)
EOS PCT: 0 % (ref 0–5)
Eosinophils Absolute: 0 10*3/uL (ref 0.0–0.7)
HCT: 31.7 % — ABNORMAL LOW (ref 36.0–46.0)
Hemoglobin: 10 g/dL — ABNORMAL LOW (ref 12.0–15.0)
LYMPHS PCT: 5 % — AB (ref 12–46)
Lymphs Abs: 0.5 10*3/uL — ABNORMAL LOW (ref 0.7–4.0)
MCH: 31.7 pg (ref 26.0–34.0)
MCHC: 31.5 g/dL (ref 30.0–36.0)
MCV: 100.6 fL — ABNORMAL HIGH (ref 78.0–100.0)
Monocytes Absolute: 0.8 10*3/uL (ref 0.1–1.0)
Monocytes Relative: 8 % (ref 3–12)
NEUTROS ABS: 8.6 10*3/uL — AB (ref 1.7–7.7)
Neutrophils Relative %: 87 % — ABNORMAL HIGH (ref 43–77)
Platelets: 286 10*3/uL (ref 150–400)
RBC: 3.15 MIL/uL — ABNORMAL LOW (ref 3.87–5.11)
RDW: 19.9 % — AB (ref 11.5–15.5)
WBC: 9.9 10*3/uL (ref 4.0–10.5)

## 2014-01-04 LAB — COMPREHENSIVE METABOLIC PANEL
AST: 7 U/L (ref 0–37)
Albumin: 2.7 g/dL — ABNORMAL LOW (ref 3.5–5.2)
Alkaline Phosphatase: 79 U/L (ref 39–117)
Anion gap: 13 (ref 5–15)
BUN: 18 mg/dL (ref 6–23)
CO2: 23 meq/L (ref 19–32)
Calcium: 8.5 mg/dL (ref 8.4–10.5)
Chloride: 102 mEq/L (ref 96–112)
Creatinine, Ser: 1.01 mg/dL (ref 0.50–1.10)
GFR calc Af Amer: 73 mL/min — ABNORMAL LOW (ref >90)
GFR, EST NON AFRICAN AMERICAN: 63 mL/min — AB (ref >90)
Glucose, Bld: 100 mg/dL — ABNORMAL HIGH (ref 70–99)
Potassium: 3.5 mEq/L — ABNORMAL LOW (ref 3.7–5.3)
Sodium: 138 mEq/L (ref 137–147)
Total Bilirubin: <0.2 mg/dL — ABNORMAL LOW (ref 0.3–1.2)
Total Protein: 6.1 g/dL (ref 6.0–8.3)

## 2014-01-04 MED ORDER — SODIUM CHLORIDE 0.9 % IV SOLN
510.0000 mg | Freq: Once | INTRAVENOUS | Status: AC
  Administered 2014-01-04: 510 mg via INTRAVENOUS
  Filled 2014-01-04: qty 17

## 2014-01-04 MED ORDER — SODIUM CHLORIDE 0.9 % IV SOLN
3.0000 mg/kg | Freq: Once | INTRAVENOUS | Status: AC
  Administered 2014-01-04: 150 mg via INTRAVENOUS
  Filled 2014-01-04: qty 15

## 2014-01-04 MED ORDER — SODIUM CHLORIDE 0.9 % IV SOLN
Freq: Once | INTRAVENOUS | Status: AC
  Administered 2014-01-04: 11:00:00 via INTRAVENOUS

## 2014-01-04 MED ORDER — HEPARIN SOD (PORK) LOCK FLUSH 100 UNIT/ML IV SOLN
INTRAVENOUS | Status: AC
  Filled 2014-01-04: qty 5

## 2014-01-04 MED ORDER — POTASSIUM CHLORIDE ER 10 MEQ PO TBCR: 10.0000 meq | EXTENDED_RELEASE_TABLET | Freq: Two times a day (BID) | ORAL | Status: AC

## 2014-01-04 MED ORDER — HEPARIN SOD (PORK) LOCK FLUSH 100 UNIT/ML IV SOLN
500.0000 [IU] | Freq: Once | INTRAVENOUS | Status: AC | PRN
  Administered 2014-01-04: 500 [IU]

## 2014-01-04 MED ORDER — SODIUM CHLORIDE 0.9 % IJ SOLN
10.0000 mL | INTRAMUSCULAR | Status: DC | PRN
  Administered 2014-01-04 (×2): 10 mL

## 2014-01-04 NOTE — Addendum Note (Signed)
Addended by: Kurtis Bushman A on: 01/04/2014 05:06 PM   Modules accepted: Orders

## 2014-01-04 NOTE — Progress Notes (Signed)
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   INTERVAL HEMATOLOGY/ONCOLOGY HX: Heather Coffey is a 53 y.o. Female who returns for followup and continuation of targeted immunotherapy with Nivolumab. Today is Cycle #4. 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 Restaging with a PET scan 2 months ago following completion of chemotherapy and radiation demonstrated findings positive for recurrent tumor and an enlarged left supraclavicular lymph node.   Heather Coffey has tolerated treatment without myelosuppression or other significant side effects.  On September 21, the patient was admitted with mental status changes and falling. Denied headaches. A brain MRI without contrast showed a 1.3 cm left frontal lesion with surrounding edema suspicious for metastasis. A lumbar puncture was performed and was negative for malignant cells. She was started on oral dexamethasone 22m b.i.d.and tolerating this dose satisfactorily. Although at the time she was weaker on the right side, this has improved and she has been ambulating without assistance. She was also diagnosed with pneumonia and completed therapy. Dr. SEppie Gibsonhas seen the patient and recommended Stereotactic RadioSurgery after a repeat MRI scan. She denies neurologic complaints today but tripped while  getting up from the chemotherapy chair. She did not sustain head trauma or other injuries.         MEDICAL HISTORY:  Past Medical History  Diagnosis Date  . Arthritis   . Anxiety   . Hyperlipidemia   . Tobacco abuse   . Shortness of breath     Hx: of breath with exertion  . Depression   . GERD (gastroesophageal reflux disease)   . H/O hiatal hernia   . 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    ALLERGIES:  is allergic to augmentin; codeine; and tetracyclines & related.  MEDICATIONS: has a current medication list which includes the following prescription(s): alprazolam, benzonatate, cyclobenzaprine, dexamethasone, first-dukes mouthwash, dronabinol, esomeprazole, fluoxetine, fluticasone, fluticasone-salmeterol, folic acid, ibuprofen, ipratropium-albuterol, levofloxacin, lidocaine-prilocaine, loratadine-pseudoephedrine, montelukast, multivitamin, nivolumab, potassium chloride, pravastatin, prochlorperazine, sucralfate, and tramadol, and the following Facility-Administered Medications: sodium chloride.  FAMILY HISTORY: family history includes COPD in her brother, father, mother, and sister; Cancer in her sister; Diabetes in her sister; Hypercholesterolemia in her mother. There is no history of Colon cancer.  REVIEW OF SYSTEMS:  See chemotherapy nurse notes charted in EPIC    Other than that discussed above is noncontributory.    PHYSICAL EXAMINATION:   vitals were not taken for this visit.   GENERAL: alert, no distress and comfortable SKIN: skin color, texture, are normal, decreased turgor associated with weight loss.no rashes  or significant lesions.  EYES: PERLA; Conjunctiva are pink and non-injected, sclera clear OROPHARYNX: no exudate, no erythema on lips, buccal mucosa, or tongue. NECK: supple, non-tender, without nodularity. No  masses LYMPH:  no palpable lymphadenopathy in the cervical, axillary or inguinal LUNGS: clear to auscultation and percussion with normal breathing effort. Breath sounds are coarse with rales.  HEART: regular rate & rhythm and no murmurs.  BREAST: Not examined ABDOMEN: abdomen soft, non-tender and normal bowel sounds, no masses present. MUSCULOSKELETAL/EXTREMITIES: Range of motion normal.  NEURO: alert & oriented x 3 with fluent speech, no focal motor/sensory deficits.     LABORATORY DATA: Infusion on 01/04/2014  Component Date Value Ref Range Status  . WBC 01/04/2014 9.9  4.0 - 10.5 K/uL Final  . RBC 01/04/2014 3.15* 3.87 - 5.11 MIL/uL Final  . Hemoglobin 01/04/2014 10.0* 12.0 - 15.0 g/dL Final  . HCT 01/04/2014 31.7* 36.0 - 46.0 % Final  . MCV 01/04/2014 100.6* 78.0 - 100.0 fL Final  . MCH 01/04/2014 31.7  26.0 - 34.0 pg Final  . MCHC 01/04/2014 31.5  30.0 - 36.0 g/dL Final  . RDW 01/04/2014 19.9* 11.5 - 15.5 % Final  . Platelets 01/04/2014 286  150 - 400 K/uL Final  . Neutrophils Relative % 01/04/2014 87* 43 - 77 % Final  . Neutro Abs 01/04/2014 8.6* 1.7 - 7.7 K/uL Final  . Lymphocytes Relative 01/04/2014 5* 12 - 46 % Final  . Lymphs Abs 01/04/2014 0.5* 0.7 - 4.0 K/uL Final  . Monocytes Relative 01/04/2014 8  3 - 12 % Final  . Monocytes Absolute 01/04/2014 0.8  0.1 - 1.0 K/uL Final  . Eosinophils Relative 01/04/2014 0  0 - 5 % Final  . Eosinophils Absolute 01/04/2014 0.0  0.0 - 0.7 K/uL Final  . Basophils Relative 01/04/2014 0  0 - 1 % Final  . Basophils Absolute 01/04/2014 0.0  0.0 - 0.1 K/uL Final  . Sodium 01/04/2014 138  137 - 147 mEq/L Final  . Potassium 01/04/2014 3.5* 3.7 - 5.3 mEq/L Final  . Chloride 01/04/2014 102  96 - 112 mEq/L Final  . CO2 01/04/2014 23  19 -  32 mEq/L Final  . Glucose, Bld 01/04/2014 100* 70 - 99 mg/dL Final  . BUN 01/04/2014 18  6 - 23 mg/dL Final  . Creatinine, Ser 01/04/2014 1.01  0.50 - 1.10 mg/dL Final  . Calcium 01/04/2014 8.5  8.4 - 10.5 mg/dL Final  . Total Protein 01/04/2014 6.1  6.0 - 8.3 g/dL Final  . Albumin 01/04/2014 2.7* 3.5 - 5.2 g/dL Final  . AST 01/04/2014 7  0 - 37 U/L Final  . ALT 01/04/2014 <5  0 - 35 U/L Final  . Alkaline Phosphatase 01/04/2014 79  39 - 117 U/L Final  . Total Bilirubin 01/04/2014 <0.2* 0.3 - 1.2 mg/dL Final  . GFR calc non Af Amer 01/04/2014 63* >90 mL/min Final  . GFR calc Af Amer 01/04/2014 73* >90 mL/min Final   Comment: (NOTE)                          The eGFR has been calculated using the CKD EPI equation.                          This calculation has not been validated in all clinical situations.                          eGFR's persistently <90 mL/min signify possible Chronic Kidney  Disease.  . Anion gap 01/04/2014 13  5 - 15 Final  Admission on 12/24/2013, Discharged on 12/26/2013  Component Date Value Ref Range Status  . WBC 12/24/2013 6.2  4.0 - 10.5 K/uL Final  . RBC 12/24/2013 2.80* 3.87 - 5.11 MIL/uL Final  . Hemoglobin 12/24/2013 8.9* 12.0 - 15.0 g/dL Final  . HCT 12/24/2013 28.3* 36.0 - 46.0 % Final  . MCV 12/24/2013 101.1* 78.0 - 100.0 fL Final  . MCH 12/24/2013 31.8  26.0 - 34.0 pg Final  . MCHC 12/24/2013 31.4  30.0 - 36.0 g/dL Final  . RDW 12/24/2013 17.1* 11.5 - 15.5 % Final  . Platelets 12/24/2013 320  150 - 400 K/uL Final  . Neutrophils Relative % 12/24/2013 74  43 - 77 % Final  . Neutro Abs 12/24/2013 4.6  1.7 - 7.7 K/uL Final  . Lymphocytes Relative 12/24/2013 11* 12 - 46 % Final  . Lymphs Abs 12/24/2013 0.7  0.7 - 4.0 K/uL Final  . Monocytes Relative 12/24/2013 13* 3 - 12 % Final  . Monocytes Absolute 12/24/2013 0.8  0.1 - 1.0 K/uL Final  . Eosinophils Relative 12/24/2013 2  0 - 5 % Final  . Eosinophils Absolute 12/24/2013 0.1  0.0  - 0.7 K/uL Final  . Basophils Relative 12/24/2013 0  0 - 1 % Final  . Basophils Absolute 12/24/2013 0.0  0.0 - 0.1 K/uL Final  . Sodium 12/24/2013 138  137 - 147 mEq/L Final  . Potassium 12/24/2013 3.8  3.7 - 5.3 mEq/L Final  . Chloride 12/24/2013 104  96 - 112 mEq/L Final  . CO2 12/24/2013 19  19 - 32 mEq/L Final  . Glucose, Bld 12/24/2013 97  70 - 99 mg/dL Final  . BUN 12/24/2013 37* 6 - 23 mg/dL Final  . Creatinine, Ser 12/24/2013 1.76* 0.50 - 1.10 mg/dL Final  . Calcium 12/24/2013 10.0  8.4 - 10.5 mg/dL Final  . Total Protein 12/24/2013 7.5  6.0 - 8.3 g/dL Final  . Albumin 12/24/2013 2.8* 3.5 - 5.2 g/dL Final  . AST 12/24/2013 11  0 - 37 U/L Final  . ALT 12/24/2013 5  0 - 35 U/L Final  . Alkaline Phosphatase 12/24/2013 125* 39 - 117 U/L Final  . Total Bilirubin 12/24/2013 <0.2* 0.3 - 1.2 mg/dL Final  . GFR calc non Af Amer 12/24/2013 32* >90 mL/min Final  . GFR calc Af Amer 12/24/2013 38* >90 mL/min Final   Comment: (NOTE)                          The eGFR has been calculated using the CKD EPI equation.                          This calculation has not been validated in all clinical situations.                          eGFR's persistently <90 mL/min signify possible Chronic Kidney                          Disease.  . Anion gap 12/24/2013 15  5 - 15 Final  . Color, Urine 12/25/2013 YELLOW  YELLOW Final  . APPearance 12/25/2013 CLEAR  CLEAR Final  . Specific Gravity, Urine 12/25/2013 1.010  1.005 - 1.030 Final  . pH 12/25/2013 6.0  5.0 - 8.0 Final  .  Glucose, UA 12/25/2013 NEGATIVE  NEGATIVE mg/dL Final  . Hgb urine dipstick 12/25/2013 TRACE* NEGATIVE Final  . Bilirubin Urine 12/25/2013 NEGATIVE  NEGATIVE Final  . Ketones, ur 12/25/2013 NEGATIVE  NEGATIVE mg/dL Final  . Protein, ur 12/25/2013 30* NEGATIVE mg/dL Final  . Urobilinogen, UA 12/25/2013 0.2  0.0 - 1.0 mg/dL Final  . Nitrite 12/25/2013 NEGATIVE  NEGATIVE Final  . Leukocytes, UA 12/25/2013 NEGATIVE  NEGATIVE Final  .  Troponin I 12/24/2013 <0.30  <0.30 ng/mL Final   Comment:                                 Due to the release kinetics of cTnI,                          a negative result within the first hours                          of the onset of symptoms does not rule out                          myocardial infarction with certainty.                          If myocardial infarction is still suspected,                          repeat the test at appropriate intervals.  Marland Kitchen Specimen Description 12/24/2013 BLOOD RIGHT HAND   Final  . Special Requests 12/24/2013 BOTTLES DRAWN AEROBIC AND ANAEROBIC 4CC   Final  . Culture 12/24/2013 NO GROWTH 5 DAYS   Final  . Report Status 12/24/2013 12/29/2013 FINAL   Final  . Specimen Description 12/24/2013 BLOOD RIGHT ARM DRAWN BY RN   Final  . Special Requests 12/24/2013 BOTTLES DRAWN AEROBIC AND ANAEROBIC AEB=5CC ANA=3CC   Final  . Culture 12/24/2013 NO GROWTH 5 DAYS   Final  . Report Status 12/24/2013 12/29/2013 FINAL   Final  . HIV 1&2 Ab, 4th Generation 12/24/2013 NONREACTIVE  NONREACTIVE Final   Comment: (NOTE)                          A NONREACTIVE HIV Ag/Ab result does not exclude HIV infection since                          the time frame for seroconversion is variable. If acute HIV infection                          is suspected, a HIV-1 RNA Qualitative TMA test is recommended.                          HIV-1/2 Antibody Diff         Not indicated.                          HIV-1 RNA, Qual TMA           Not indicated.  PLEASE NOTE: This information has been disclosed to you from records                          whose confidentiality may be protected by state law. If your state                          requires such protection, then the state law prohibits you from making                          any further disclosure of the information without the specific written                          consent of the person to whom it pertains, or as  otherwise permitted                          by law. A general authorization for the release of medical or other                          information is NOT sufficient for this purpose.                          The performance of this assay has not been clinically validated in                          patients less than 57 years old.                          Performed at [facility]  . Specimen Description 12/25/2013 URINE, RANDOM   Final  . Special Requests 12/25/2013 NONE   Final  . Legionella Antigen, Urine 12/25/2013    Final                   Value:Negative for Legionella pneumophilia serogroup 1                         Performed at [facility]  . Report Status 12/25/2013 12/26/2013 FINAL   Final  . Strep Pneumo Urinary Antigen 12/25/2013 NEGATIVE  NEGATIVE Final   Comment:                                 Infection due to S. pneumoniae                          cannot be absolutely ruled out                          since the antigen present                          may be below the detection limit                          of the test.  Performed at [facility]  . Ammonia 12/24/2013 17  11 - 60 umol/L Final  . MRSA by PCR 12/25/2013 NEGATIVE  NEGATIVE Final   Comment:                                 The GeneXpert MRSA Assay (FDA                          approved for NASAL specimens                          only), is one component of a                          comprehensive MRSA colonization                          surveillance program. It is not                          intended to diagnose MRSA                          infection nor to guide or                          monitor treatment for                          MRSA infections.  . Squamous Epithelial / LPF 12/25/2013 FEW* RARE Final  . WBC, UA 12/25/2013 0-2  <3 WBC/hpf Final  . RBC / HPF 12/25/2013 0-2  <3 RBC/hpf Final  . Bacteria, UA 12/25/2013 MANY* RARE Final  . Casts  12/25/2013 HYALINE CASTS* NEGATIVE Final   GRANULAR CAST  . Sodium, Ur 12/25/2013 57   Final  . Creatinine, Urine 12/25/2013 49.52   Final  . Sodium 12/25/2013 138  137 - 147 mEq/L Final  . Potassium 12/25/2013 4.1  3.7 - 5.3 mEq/L Final  . Chloride 12/25/2013 108  96 - 112 mEq/L Final  . CO2 12/25/2013 18* 19 - 32 mEq/L Final  . Glucose, Bld 12/25/2013 164* 70 - 99 mg/dL Final  . BUN 12/25/2013 28* 6 - 23 mg/dL Final  . Creatinine, Ser 12/25/2013 1.41* 0.50 - 1.10 mg/dL Final  . Calcium 12/25/2013 9.2  8.4 - 10.5 mg/dL Final  . GFR calc non Af Amer 12/25/2013 42* >90 mL/min Final  . GFR calc Af Amer 12/25/2013 49* >90 mL/min Final   Comment: (NOTE)                          The eGFR has been calculated using the CKD EPI equation.                          This calculation has not been validated in all clinical situations.                          eGFR's persistently <90 mL/min signify possible Chronic Kidney  Disease.  . Anion gap 12/25/2013 12  5 - 15 Final  . Vitamin B-12 12/25/2013 195* 211 - 911 pg/mL Final   Performed at [facility]  . Folate 12/25/2013 2.8*  Final   Comment: (NOTE)                          Reference Ranges                                 Deficient:       0.4 - 3.3 ng/mL                                 Indeterminate:   3.4 - 5.4 ng/mL                                 Normal:              > 5.4 ng/mL                          Performed at [facility]  . Iron 12/25/2013 36* 42 - 135 ug/dL Final  . TIBC 12/25/2013 212* 250 - 470 ug/dL Final  . Saturation Ratios 12/25/2013 17* 20 - 55 % Final  . UIBC 12/25/2013 176  125 - 400 ug/dL Final   Performed at [facility]  . Ferritin 12/25/2013 144  10 - 291 ng/mL Final   Performed at [facility]  . Retic Ct Pct 12/25/2013 1.9  0.4 - 3.1 % Final  . RBC. 12/25/2013 2.55* 3.87 - 5.11 MIL/uL Final  . Retic Count, Manual 12/25/2013 48.5  19.0 - 186.0 K/uL Final  .  Transferrin Receptor, Soluble 12/25/2013 2.13* 0.76 - 1.76 mg/L Final   Performed at [facility]  . Sodium 12/26/2013 140  137 - 147 mEq/L Final  . Potassium 12/26/2013 4.4  3.7 - 5.3 mEq/L Final  . Chloride 12/26/2013 110  96 - 112 mEq/L Final  . CO2 12/26/2013 17* 19 - 32 mEq/L Final  . Glucose, Bld 12/26/2013 125* 70 - 99 mg/dL Final  . BUN 12/26/2013 25* 6 - 23 mg/dL Final  . Creatinine, Ser 12/26/2013 1.34* 0.50 - 1.10 mg/dL Final  . Calcium 12/26/2013 9.2  8.4 - 10.5 mg/dL Final  . GFR calc non Af Amer 12/26/2013 45* >90 mL/min Final  . GFR calc Af Amer 12/26/2013 52* >90 mL/min Final   Comment: (NOTE)                          The eGFR has been calculated using the CKD EPI equation.                          This calculation has not been validated in all clinical situations.                          eGFR's persistently <90 mL/min signify possible Chronic Kidney                          Disease.  . Anion gap 12/26/2013 13  5 - 15 Final  . WBC 12/26/2013 3.3* 4.0 - 10.5 K/uL Final  . RBC 12/26/2013 2.43* 3.87 - 5.11 MIL/uL Final  . Hemoglobin 12/26/2013 7.8* 12.0 - 15.0 g/dL Final  . HCT 12/26/2013 24.5* 36.0 - 46.0 % Final  . MCV 12/26/2013 100.8* 78.0 - 100.0 fL Final  . MCH 12/26/2013 32.1  26.0 - 34.0 pg Final  . MCHC 12/26/2013 31.8  30.0 - 36.0 g/dL Final  . RDW 12/26/2013 16.9* 11.5 - 15.5 % Final  . Platelets 12/26/2013 321  150 - 400 K/uL Final  . Neutrophils Relative % 12/26/2013 82* 43 - 77 % Final  . Neutro Abs 12/26/2013 2.7  1.7 - 7.7 K/uL Final  . Lymphocytes Relative 12/26/2013 12  12 - 46 % Final  . Lymphs Abs 12/26/2013 0.4* 0.7 - 4.0 K/uL Final  . Monocytes Relative 12/26/2013 6  3 - 12 % Final  . Monocytes Absolute 12/26/2013 0.2  0.1 - 1.0 K/uL Final  . Eosinophils Relative 12/26/2013 0  0 - 5 % Final  . Eosinophils Absolute 12/26/2013 0.0  0.0 - 0.7 K/uL Final  . Basophils Relative 12/26/2013 0  0 - 1 % Final  . Basophils Absolute 12/26/2013 0.0   0.0 - 0.1 K/uL Final  . LD, Fluid 12/26/2013 36* 3 - 23 U/L Final  . Fluid Type-FLDH 12/26/2013 CSF   Final  Hospital Outpatient Visit on 12/26/2013  Component Date Value Ref Range Status  . Specimen Description 12/26/2013 CSF   Final  . Special Requests 12/26/2013 NONE   Final  . Gram Stain 12/26/2013    Final                   Value:WBC PRESENT, PREDOMINANTLY MONONUCLEAR                         NO ORGANISMS SEEN                         CYTOSPIN Performed at [facility]                         Performed at [facility]  . Culture 12/26/2013    Final                   Value:NO GROWTH 3 DAYS                         Performed at [facility]  . Report Status 12/26/2013 12/29/2013 FINAL   Final  . Tube # 12/26/2013 4   Final  . Color, CSF 12/26/2013 COLORLESS  COLORLESS Final  . Appearance, CSF 12/26/2013 CLEAR  CLEAR Final  . Supernatant 12/26/2013 NOT INDICATED   Final  . RBC Count, CSF 12/26/2013 0  0 /cu mm Final  . WBC, CSF 12/26/2013 7* 0 - 5 /cu mm Final  . Segmented Neutrophils-CSF 12/26/2013 TOO FEW TO COUNT, SMEAR AVAILABLE FOR REVIEW  0 - 6 % Final   Comment: THE PREDOMINANT WBC POPULATION = LYMPHOCYTES                          CYTOLOGY TO FOLLOW.  . Glucose, CSF 12/26/2013 78* 43 - 76 mg/dL Final  . Total  Protein, CSF 12/26/2013 51* 15 - 45 mg/dL Final  Infusion on 12/20/2013  Component  Date Value Ref Range Status  . Sed Rate 12/20/2013 124* 0 - 22 mm/hr Final  . WBC 12/20/2013 6.0  4.0 - 10.5 K/uL Final  . RBC 12/20/2013 3.05* 3.87 - 5.11 MIL/uL Final  . Hemoglobin 12/20/2013 9.7* 12.0 - 15.0 g/dL Final  . HCT 12/20/2013 30.6* 36.0 - 46.0 % Final  . MCV 12/20/2013 100.3* 78.0 - 100.0 fL Final  . MCH 12/20/2013 31.8  26.0 - 34.0 pg Final  . MCHC 12/20/2013 31.7  30.0 - 36.0 g/dL Final  . RDW 12/20/2013 16.9* 11.5 - 15.5 % Final  . Platelets 12/20/2013 271  150 - 400 K/uL Final  . Neutrophils Relative % 12/20/2013 77  43 - 77 % Final  . Neutro  Abs 12/20/2013 4.6  1.7 - 7.7 K/uL Final  . Lymphocytes Relative 12/20/2013 9* 12 - 46 % Final  . Lymphs Abs 12/20/2013 0.6* 0.7 - 4.0 K/uL Final  . Monocytes Relative 12/20/2013 13* 3 - 12 % Final  . Monocytes Absolute 12/20/2013 0.8  0.1 - 1.0 K/uL Final  . Eosinophils Relative 12/20/2013 1  0 - 5 % Final  . Eosinophils Absolute 12/20/2013 0.1  0.0 - 0.7 K/uL Final  . Basophils Relative 12/20/2013 0  0 - 1 % Final  . Basophils Absolute 12/20/2013 0.0  0.0 - 0.1 K/uL Final  . Sodium 12/20/2013 138  137 - 147 mEq/L Final  . Potassium 12/20/2013 4.2  3.7 - 5.3 mEq/L Final  . Chloride 12/20/2013 103  96 - 112 mEq/L Final  . CO2 12/20/2013 21  19 - 32 mEq/L Final  . Glucose, Bld 12/20/2013 107* 70 - 99 mg/dL Final  . BUN 12/20/2013 29* 6 - 23 mg/dL Final  . Creatinine, Ser 12/20/2013 1.47* 0.50 - 1.10 mg/dL Final  . Calcium 12/20/2013 9.9  8.4 - 10.5 mg/dL Final  . Total Protein 12/20/2013 7.6  6.0 - 8.3 g/dL Final  . Albumin 12/20/2013 2.8* 3.5 - 5.2 g/dL Final  . AST 12/20/2013 10  0 - 37 U/L Final  . ALT 12/20/2013 <5  0 - 35 U/L Final  . Alkaline Phosphatase 12/20/2013 133* 39 - 117 U/L Final  . Total Bilirubin 12/20/2013 <0.2* 0.3 - 1.2 mg/dL Final  . GFR calc non Af Amer 12/20/2013 40* >90 mL/min Final  . GFR calc Af Amer 12/20/2013 47* >90 mL/min Final   Comment: (NOTE)                          The eGFR has been calculated using the CKD EPI equation.                          This calculation has not been validated in all clinical situations.                          eGFR's persistently <90 mL/min signify possible Chronic Kidney                          Disease.  . Anion gap 12/20/2013 14  5 - 15 Final  . TSH 12/20/2013 0.423  0.350 - 4.500 uIU/mL Final   Performed at [facility]: Mr Brain Wo Contrast  12/25/2013   CLINICAL DATA:  Encephalopathy. Progressive weakness and confusion. History of metastatic lung cancer.  EXAM: MRI HEAD WITHOUT  CONTRAST  TECHNIQUE: Multiplanar, multiecho pulse sequences of the brain and surrounding structures were obtained without intravenous contrast.  COMPARISON:  None.  FINDINGS: There is a 1.3 cm lesion in the high left frontal lobe with a small amount of surrounding edema. The lesion demonstrates peripheral increased diffusion-weighted signal without clear restricted diffusion on the ADC map. No definite acute infarct is identified. There is no evidence of intracranial hemorrhage, midline shift, or extra-axial fluid collection. Remote lacunar infarcts are present in the thalami, right larger than left. Multiple remote, small infarcts are also present in both cerebellar hemispheres. There is mild generalized cerebral atrophy.  Orbits are unremarkable. There is mild left maxillary sinus mucosal thickening. Mastoid air cells are clear. Major intracranial vascular flow voids are preserved.  IMPRESSION: 1. 1.3 cm left frontal lesion with small amount of surrounding edema, most concerning for metastasis. Further evaluation with post-contrast brain MRI is recommended when patient's renal function improves. 2. 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   Electronically Signed   By: Logan Bores   On: 12/25/2013 08:42   Dg Chest Port 1 View  12/24/2013   CLINICAL DATA:  Weakness after chemotherapy.  EXAM: PORTABLE CHEST - 1 VIEW  COMPARISON:  10/11/2013  FINDINGS: There is a left subclavian porta catheter, tip at the upper SVC.  Increased density in the left mid chest, not entirely explained by the patient's recurrent malignancy in this region.  The left apex is obscured by the patient's chin. There is no evidence of pneumothorax or significant pleural effusion. The right lung is well aerated.  Normal heart size.  No acute mediastinal contour abnormality.  IMPRESSION: New left perihilar airspace disease, likely post obstructive pneumonia or pneumonitis.   Electronically Signed   By: Jorje Guild M.D.   On:  12/24/2013 22:15   Dg Fluoro Guide Lumbar Puncture  12/26/2013   CLINICAL DATA:  Lung cancer, acute encephalopathy  EXAM: DIAGNOSTIC LUMBAR PUNCTURE UNDER FLUOROSCOPIC GUIDANCE  FLUOROSCOPY TIME:  0 min 36 seconds  PROCEDURE: Procedure, benefits, and risks were discussed with the patient, including alternatives.  Patient's questions were answered.  Written informed consent was obtained.  Timeout protocol followed.  Patient placed prone.  L3-L4 disc space was localized under fluoroscopy.  Skin prepped and draped in usual sterile fashion.  Skin and soft tissues anesthetized with 2 mL of 1% lidocaine.  22 gauge needle was advanced into the spinal canal where clear colorless CSF was encountered with an opening pressure of 3 cm H2O (prone).  10 mL of CSF was obtained in 4 tubes for requested analysis.  Procedure tolerated very well by patient without immediate complication.  IMPRESSION: Lumbar puncture as above.   Electronically Signed   By: Lavonia Dana M.D.   On: 12/26/2013 10:53    ASSESSMENT:   #1. 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  #2. Left frontal mass with edema secondary to #1 on decadron  #3. New left perihilar infiltrate suggestive of infection treated with IV antibiotics as inpatient.   RECOMMENDATIONS:   Heather Coffey is clinically stable and is cleared to resume IV Nivolumab therapy. She has a followup apointment with Dr. Lanell Persons after her Brain MRI in anticipation of [place] in [place] if Heather Coffey has limited CNS involvement with her lung cancer. Followuop chest xray next week.         Appproximately 30 minutes was spent during the visit with greater than 50% of the time providing patient counseling.  Darrall Dears, MD 01/04/2014 6:06 PM  .

## 2014-01-04 NOTE — Patient Instructions (Signed)
[facility] Discharge Instructions  RECOMMENDATIONS MADE BY THE CONSULTANT AND ANY TEST RESULTS WILL BE SENT TO YOUR REFERRING PHYSICIAN.  EXAM FINDINGS BY THE PHYSICIAN TODAY AND SIGNS OR SYMPTOMS TO REPORT TO CLINIC OR PRIMARY PHYSICIAN: Exam and findings as discussed by Dr. Bubba Hales.  Will continue same regimen.  Your iron is low and will give you iron infusions today and in 1 week.  Your potassium is low and prescription has been e-scribed to your pharmacy.  Take your nausea medication as ordered for nausea.  Report fevers, chills, uncontrolled nausea, vomiting or other problems.  MEDICATIONS PRESCRIBED:  Kdur 10 mEq twice daily for 30 days.  INSTRUCTIONS/FOLLOW-UP: Follow-up with iron infusions, chemotherapy and office visit in 2 weeks.  Thank you for choosing [facility] to provide your oncology and hematology care.  To afford each patient quality time with our providers, please arrive at least 15 minutes before your scheduled appointment time.  With your help, our goal is to use those 15 minutes to complete the necessary work-up to ensure our physicians have the information they need to help with your evaluation and healthcare recommendations.    Effective January 1st, 2014, we ask that you re-schedule your appointment with our physicians should you arrive 10 or more minutes late for your appointment.  We strive to give you quality time with our providers, and arriving late affects you and other patients whose appointments are after yours.    Again, thank you for choosing [facility].  Our hope is that these requests will decrease the amount of time that you wait before being seen by our physicians.       _____________________________________________________________  Should you have questions after your visit to [facility], please contact our office at [phone] between the hours of 8:30 a.m. and 4:30 p.m.  Voicemails left  after 4:30 p.m. will not be returned until the following business day.  For prescription refill requests, have your pharmacy contact our office with your prescription refill request.    _______________________________________________________________  We hope that we have given you very good care.  You may receive a patient satisfaction survey in the mail, please complete it and return it as soon as possible.  We value your feedback!  _______________________________________________________________  Have you asked about our STAR program?  STAR stands for Survivorship Training and Rehabilitation, and this is a nationally recognized cancer care program that focuses on survivorship and rehabilitation.  Cancer and cancer treatments may cause problems, such as, pain, making you feel tired and keeping you from doing the things that you need or want to do. Cancer rehabilitation can help. Our goal is to reduce these troubling effects and help you have the best quality of life possible.  You may receive a survey from a nurse that asks questions about your current state of health.  Based on the survey results, all eligible patients will be referred to the [facility] program for an evaluation so we can better serve you!  A frequently asked questions sheet is available upon request.

## 2014-01-04 NOTE — Progress Notes (Signed)
Tolerated treatment well. Patient experienced fall in the presence  of Dr. Bubba Hales. Examined by MD. No pain or injury reported by patient. Discharged home with husband, escorted out in w/c.

## 2014-01-04 NOTE — Progress Notes (Signed)
Tolerated feraheme well

## 2014-01-07 ENCOUNTER — Ambulatory Visit
Admission: RE | Admit: 2014-01-07 | Discharge: 2014-01-07 | Disposition: A | Discharge status: Home / Self Care | Payer: Managed Care, Other (non HMO) | Source: Ambulatory Visit | Attending: Radiation Oncology | Admitting: Radiation Oncology

## 2014-01-07 ENCOUNTER — Other Ambulatory Visit (HOSPITAL_COMMUNITY): Payer: Self-pay | Admitting: *Deleted

## 2014-01-07 DIAGNOSIS — C7949 Secondary malignant neoplasm of other parts of nervous system: Principal | ICD-10-CM

## 2014-01-07 DIAGNOSIS — C7931 Secondary malignant neoplasm of brain: Secondary | ICD-10-CM

## 2014-01-07 DIAGNOSIS — J189 Pneumonia, unspecified organism: Secondary | ICD-10-CM

## 2014-01-07 LAB — ERYTHROPOIETIN: Erythropoietin: 93.3 m[IU]/mL — ABNORMAL HIGH (ref 2.6–18.5)

## 2014-01-07 MED ORDER — GADOBENATE DIMEGLUMINE 529 MG/ML IV SOLN: 10.0000 mL | Freq: Once | INTRAVENOUS | Status: AC | PRN

## 2014-01-08 ENCOUNTER — Encounter: Payer: Self-pay | Admitting: Radiation Oncology

## 2014-01-08 NOTE — Progress Notes (Signed)
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  We spoke about whole brain radiotherapy versus stereotactic radiosurgery to the brain. We spoke about the differing risks benefits and side effects of both of these treatments. She would like to proceed with stereotactic radiosurgery to the brain tumor. I think this is a good option.  I will ask Mont Dutton, our navigator, to schedule simulation and NSU consultation for her.  If possible, she would like a mid-morning (ie 10am) appt tomorrow in [place] for her simulation.  She will be home around 1pm to talk about scheduling.  (of note, re consultation took place last week with me at [facility].)  -----------------------------------  Eppie Gibson, MD

## 2014-01-09 ENCOUNTER — Ambulatory Visit: Payer: Managed Care, Other (non HMO) | Admitting: Radiation Oncology

## 2014-01-09 ENCOUNTER — Ambulatory Visit: Payer: Managed Care, Other (non HMO)

## 2014-01-11 ENCOUNTER — Ambulatory Visit
Admission: RE | Admit: 2014-01-11 | Discharge: 2014-01-11 | Disposition: A | Discharge status: Home / Self Care | Payer: Managed Care, Other (non HMO) | Source: Ambulatory Visit | Attending: Radiation Oncology | Admitting: Radiation Oncology

## 2014-01-11 ENCOUNTER — Ambulatory Visit (HOSPITAL_COMMUNITY)
Admission: RE | Admit: 2014-01-11 | Discharge: 2014-01-11 | Disposition: A | Discharge status: Home / Self Care | Payer: Managed Care, Other (non HMO) | Source: Ambulatory Visit | Attending: Hematology | Admitting: Hematology

## 2014-01-11 VITALS — BP 103/59 | HR 85 | Temp 98.0°F | Wt 109.6 lb

## 2014-01-11 DIAGNOSIS — C3410 Malignant neoplasm of upper lobe, unspecified bronchus or lung: Secondary | ICD-10-CM

## 2014-01-11 DIAGNOSIS — Z85118 Personal history of other malignant neoplasm of bronchus and lung: Secondary | ICD-10-CM | POA: Insufficient documentation

## 2014-01-11 DIAGNOSIS — J189 Pneumonia, unspecified organism: Secondary | ICD-10-CM | POA: Insufficient documentation

## 2014-01-11 DIAGNOSIS — C7931 Secondary malignant neoplasm of brain: Secondary | ICD-10-CM | POA: Insufficient documentation

## 2014-01-11 DIAGNOSIS — Z510 Encounter for antineoplastic radiation therapy: Secondary | ICD-10-CM | POA: Insufficient documentation

## 2014-01-11 MED ORDER — SODIUM CHLORIDE 0.9 % IJ SOLN
10.0000 mL | Freq: Once | INTRAMUSCULAR | Status: AC
  Administered 2014-01-11: 10 mL via INTRAVENOUS

## 2014-01-11 MED ORDER — HEPARIN SOD (PORK) LOCK FLUSH 100 UNIT/ML IV SOLN
500.0000 [IU] | Freq: Once | INTRAVENOUS | Status: AC
  Administered 2014-01-11: 500 [IU] via INTRAVENOUS

## 2014-01-11 NOTE — Progress Notes (Signed)
deacessed Por-A-cath. After flushing with 10 ml of 0.9 NS and 23ml of Heparin.  Tolerated without any concerns.  Site covered with a bandaid after confirming that she has no allergies

## 2014-01-11 NOTE — Progress Notes (Signed)
  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  Identity was confirmed.  All relevant records and images related to the planned course of therapy were reviewed.  The patient freely provided informed written consent to proceed with treatment after reviewing the details related to the planned course of therapy. The consent form was witnessed and verified by the simulation staff. Intravenous access was established for contrast administration. Then, the patient was set-up in a stable reproducible supine position for radiation therapy.  A relocatable thermoplastic stereotactic head frame was fabricated for precise immobilization.  CT images were obtained.  Surface markings were placed.  The CT images were loaded into the planning software and fused with the patient's targeting MRI scan.  Then the target and avoidance structures were contoured.  Treatment planning then occurred.  The radiation prescription was entered and confirmed.  I have requested 3D planning  I have requested a DVH of the following structures: Brain stem, brain, left eye, right eye, lenses, optic chiasm, target volumes, uninvolved brain, and normal tissue.    PLAN:  The patient will receive 20 Gy in 1 fraction to her left high frontal lobe 72mm brain metastasis with stereotactic radiosurgery.  ________________________________    Eppie Gibson, MD

## 2014-01-11 NOTE — Progress Notes (Signed)
20 gauge  1" huber needle used to access left chest power port.Good blood return.flushed with normal saline and secured with dressing  To be used for contrast for ct simulation planning.Tolerated well. Site unremarkable.Not a diabetic and no allergies to contrast dye.Explained to daughter Morey Hummingbird patient will be here about 90 minutes.

## 2014-01-17 NOTE — Progress Notes (Signed)
Patient is seen emergently in the chemotherapy area after being started on IV Feraheme.  She began coughing up copious amounts of thick red blood.  Feraheme was discontinued immediately.  She remained accessed and escorted to ED.  Spoke to ED physician to inform him on patient.  KEFALAS,THOMAS 17-Feb-2014

## 2014-01-18 ENCOUNTER — Emergency Department (HOSPITAL_COMMUNITY): Payer: Managed Care, Other (non HMO)

## 2014-01-18 ENCOUNTER — Encounter (HOSPITAL_BASED_OUTPATIENT_CLINIC_OR_DEPARTMENT_OTHER): Payer: Managed Care, Other (non HMO)

## 2014-01-18 ENCOUNTER — Other Ambulatory Visit: Payer: Managed Care, Other (non HMO)

## 2014-01-18 ENCOUNTER — Emergency Department (HOSPITAL_COMMUNITY)
Admission: EM | Admit: 2014-01-18 | Discharge: 2014-02-03 | Disposition: E | Payer: Managed Care, Other (non HMO) | Attending: Emergency Medicine | Admitting: Emergency Medicine

## 2014-01-18 ENCOUNTER — Encounter (HOSPITAL_COMMUNITY): Payer: Self-pay

## 2014-01-18 ENCOUNTER — Encounter (HOSPITAL_COMMUNITY): Payer: Self-pay | Admitting: Emergency Medicine

## 2014-01-18 ENCOUNTER — Other Ambulatory Visit: Payer: Self-pay

## 2014-01-18 ENCOUNTER — Encounter (HOSPITAL_BASED_OUTPATIENT_CLINIC_OR_DEPARTMENT_OTHER): Payer: Managed Care, Other (non HMO) | Admitting: Oncology

## 2014-01-18 VITALS — BP 100/51 | HR 86 | Temp 98.2°F | Resp 18 | Wt 112.0 lb

## 2014-01-18 DIAGNOSIS — Z85118 Personal history of other malignant neoplasm of bronchus and lung: Secondary | ICD-10-CM | POA: Insufficient documentation

## 2014-01-18 DIAGNOSIS — M1990 Unspecified osteoarthritis, unspecified site: Secondary | ICD-10-CM | POA: Insufficient documentation

## 2014-01-18 DIAGNOSIS — D6481 Anemia due to antineoplastic chemotherapy: Secondary | ICD-10-CM

## 2014-01-18 DIAGNOSIS — T451X5A Adverse effect of antineoplastic and immunosuppressive drugs, initial encounter: Secondary | ICD-10-CM

## 2014-01-18 DIAGNOSIS — C3412 Malignant neoplasm of upper lobe, left bronchus or lung: Secondary | ICD-10-CM

## 2014-01-18 DIAGNOSIS — C771 Secondary and unspecified malignant neoplasm of intrathoracic lymph nodes: Secondary | ICD-10-CM

## 2014-01-18 DIAGNOSIS — Z7952 Long term (current) use of systemic steroids: Secondary | ICD-10-CM | POA: Insufficient documentation

## 2014-01-18 DIAGNOSIS — C7801 Secondary malignant neoplasm of right lung: Secondary | ICD-10-CM

## 2014-01-18 DIAGNOSIS — R0902 Hypoxemia: Secondary | ICD-10-CM

## 2014-01-18 DIAGNOSIS — R042 Hemoptysis: Secondary | ICD-10-CM | POA: Insufficient documentation

## 2014-01-18 DIAGNOSIS — F419 Anxiety disorder, unspecified: Secondary | ICD-10-CM | POA: Insufficient documentation

## 2014-01-18 DIAGNOSIS — C3410 Malignant neoplasm of upper lobe, unspecified bronchus or lung: Secondary | ICD-10-CM

## 2014-01-18 DIAGNOSIS — Z7951 Long term (current) use of inhaled steroids: Secondary | ICD-10-CM | POA: Insufficient documentation

## 2014-01-18 DIAGNOSIS — Z791 Long term (current) use of non-steroidal anti-inflammatories (NSAID): Secondary | ICD-10-CM | POA: Insufficient documentation

## 2014-01-18 DIAGNOSIS — C7802 Secondary malignant neoplasm of left lung: Secondary | ICD-10-CM

## 2014-01-18 DIAGNOSIS — D509 Iron deficiency anemia, unspecified: Secondary | ICD-10-CM

## 2014-01-18 DIAGNOSIS — K219 Gastro-esophageal reflux disease without esophagitis: Secondary | ICD-10-CM | POA: Insufficient documentation

## 2014-01-18 DIAGNOSIS — F329 Major depressive disorder, single episode, unspecified: Secondary | ICD-10-CM | POA: Insufficient documentation

## 2014-01-18 DIAGNOSIS — C7931 Secondary malignant neoplasm of brain: Secondary | ICD-10-CM | POA: Insufficient documentation

## 2014-01-18 DIAGNOSIS — Z720 Tobacco use: Secondary | ICD-10-CM | POA: Insufficient documentation

## 2014-01-18 DIAGNOSIS — E785 Hyperlipidemia, unspecified: Secondary | ICD-10-CM | POA: Insufficient documentation

## 2014-01-18 DIAGNOSIS — J9622 Acute and chronic respiratory failure with hypercapnia: Secondary | ICD-10-CM | POA: Insufficient documentation

## 2014-01-18 DIAGNOSIS — Z79899 Other long term (current) drug therapy: Secondary | ICD-10-CM | POA: Insufficient documentation

## 2014-01-18 LAB — BLOOD GAS, ARTERIAL
Acid-base deficit: 13.5 mmol/L — ABNORMAL HIGH (ref 0.0–2.0)
Acid-base deficit: 7.6 mmol/L — ABNORMAL HIGH (ref 0.0–2.0)
Bicarbonate: 17.4 mEq/L — ABNORMAL LOW (ref 20.0–24.0)
Bicarbonate: 18.9 mEq/L — ABNORMAL LOW (ref 20.0–24.0)
DELIVERY SYSTEMS: POSITIVE
Delivery systems: POSITIVE
Drawn by: 22179
Expiratory PAP: 10
Expiratory PAP: 10
FIO2: 50 %
INSPIRATORY PAP: 25
Inspiratory PAP: 25
O2 CONTENT: 100 L/min
O2 Saturation: 81 %
O2 Saturation: 97.7 %
PATIENT TEMPERATURE: 37
PCO2 ART: 49.4 mmHg — AB (ref 35.0–45.0)
PH ART: 6.92 — AB (ref 7.350–7.450)
PO2 ART: 178 mmHg — AB (ref 80.0–100.0)
Patient temperature: 37
RATE: 12 resp/min
TCO2: 18.4 mmol/L (ref 0–100)
TCO2: 18.3 mmol/L (ref 0–100)
pCO2 arterial: 89.4 mmHg (ref 35.0–45.0)
pH, Arterial: 7.208 — ABNORMAL LOW (ref 7.350–7.450)
pO2, Arterial: 75 mmHg — ABNORMAL LOW (ref 80.0–100.0)

## 2014-01-18 LAB — COMPREHENSIVE METABOLIC PANEL
ALK PHOS: 70 U/L (ref 39–117)
ALT: 5 U/L (ref 0–35)
ALT: <5 U/L (ref 0–35)
ANION GAP: 12 (ref 5–15)
ANION GAP: 12 (ref 5–15)
AST: 6 U/L (ref 0–37)
AST: 8 U/L (ref 0–37)
Albumin: 3.1 g/dL — ABNORMAL LOW (ref 3.5–5.2)
Albumin: 3.1 g/dL — ABNORMAL LOW (ref 3.5–5.2)
Alkaline Phosphatase: 68 U/L (ref 39–117)
BUN: 26 mg/dL — AB (ref 6–23)
BUN: 25 mg/dL — AB (ref 6–23)
CHLORIDE: 105 meq/L (ref 96–112)
CO2: 23 meq/L (ref 19–32)
CO2: 24 mEq/L (ref 19–32)
CREATININE: 0.84 mg/dL (ref 0.50–1.10)
Calcium: 8.8 mg/dL (ref 8.4–10.5)
Calcium: 9.1 mg/dL (ref 8.4–10.5)
Chloride: 105 mEq/L (ref 96–112)
Creatinine, Ser: 0.8 mg/dL (ref 0.50–1.10)
GFR calc Af Amer: >90 mL/min (ref >90)
GFR calc Af Amer: >90 mL/min (ref >90)
GFR calc non Af Amer: 79 mL/min — ABNORMAL LOW (ref >90)
GFR calc non Af Amer: 84 mL/min — ABNORMAL LOW (ref >90)
GLUCOSE: 87 mg/dL (ref 70–99)
GLUCOSE: 124 mg/dL — AB (ref 70–99)
POTASSIUM: 5.3 meq/L (ref 3.7–5.3)
Potassium: 4.1 mEq/L (ref 3.7–5.3)
Sodium: 140 mEq/L (ref 137–147)
Sodium: 141 mEq/L (ref 137–147)
Total Protein: 6.2 g/dL (ref 6.0–8.3)
Total Protein: 6.4 g/dL (ref 6.0–8.3)

## 2014-01-18 LAB — CBC WITH DIFFERENTIAL/PLATELET
BASOS PCT: 0 % (ref 0–1)
Basophils Absolute: 0 10*3/uL (ref 0.0–0.1)
Basophils Absolute: 0 10*3/uL (ref 0.0–0.1)
Basophils Relative: 0 % (ref 0–1)
EOS ABS: 0.1 10*3/uL (ref 0.0–0.7)
EOS PCT: 0 % (ref 0–5)
Eosinophils Absolute: 0.1 10*3/uL (ref 0.0–0.7)
Eosinophils Relative: 1 % (ref 0–5)
HCT: 35.5 % — ABNORMAL LOW (ref 36.0–46.0)
HEMATOCRIT: 33.4 % — AB (ref 36.0–46.0)
HEMOGLOBIN: 10.3 g/dL — AB (ref 12.0–15.0)
HEMOGLOBIN: 10.8 g/dL — AB (ref 12.0–15.0)
LYMPHS ABS: 0.5 10*3/uL — AB (ref 0.7–4.0)
LYMPHS ABS: 2.5 10*3/uL (ref 0.7–4.0)
LYMPHS PCT: 4 % — AB (ref 12–46)
Lymphocytes Relative: 14 % (ref 12–46)
MCH: 31.7 pg (ref 26.0–34.0)
MCH: 32 pg (ref 26.0–34.0)
MCHC: 30.8 g/dL (ref 30.0–36.0)
MCHC: 30.4 g/dL (ref 30.0–36.0)
MCV: 102.8 fL — ABNORMAL HIGH (ref 78.0–100.0)
MCV: 105.3 fL — AB (ref 78.0–100.0)
MONO ABS: 0.6 10*3/uL (ref 0.1–1.0)
MONO ABS: 1.2 10*3/uL — AB (ref 0.1–1.0)
MONOS PCT: 5 % (ref 3–12)
MONOS PCT: 7 % (ref 3–12)
NEUTROS ABS: 11.2 10*3/uL — AB (ref 1.7–7.7)
NEUTROS PCT: 79 % — AB (ref 43–77)
Neutro Abs: 13.9 10*3/uL — ABNORMAL HIGH (ref 1.7–7.7)
Neutrophils Relative %: 90 % — ABNORMAL HIGH (ref 43–77)
Platelets: 244 10*3/uL (ref 150–400)
Platelets: 279 10*3/uL (ref 150–400)
RBC: 3.25 MIL/uL — AB (ref 3.87–5.11)
RBC: 3.37 MIL/uL — AB (ref 3.87–5.11)
RDW: 18.8 % — ABNORMAL HIGH (ref 11.5–15.5)
RDW: 19 % — ABNORMAL HIGH (ref 11.5–15.5)
WBC: 12.3 10*3/uL — ABNORMAL HIGH (ref 4.0–10.5)
WBC: 17.6 10*3/uL — ABNORMAL HIGH (ref 4.0–10.5)

## 2014-01-18 LAB — CBG MONITORING, ED: Glucose-Capillary: 184 mg/dL — ABNORMAL HIGH (ref 70–99)

## 2014-01-18 LAB — I-STAT CG4 LACTIC ACID, ED: Lactic Acid, Venous: 3.22 mmol/L — ABNORMAL HIGH (ref 0.5–2.2)

## 2014-01-18 LAB — MAGNESIUM: MAGNESIUM: 2 mg/dL (ref 1.5–2.5)

## 2014-01-18 LAB — TYPE AND SCREEN
ABO/RH(D): A NEG
ANTIBODY SCREEN: NEGATIVE

## 2014-01-18 LAB — PRO B NATRIURETIC PEPTIDE: Pro B Natriuretic peptide (BNP): 367.1 pg/mL — ABNORMAL HIGH (ref 0–125)

## 2014-01-18 LAB — TROPONIN I: Troponin I: <0.3 ng/mL (ref <0.30)

## 2014-01-18 LAB — PROTIME-INR
INR: 1.04 (ref 0.00–1.49)
Prothrombin Time: 13.7 seconds (ref 11.6–15.2)

## 2014-01-18 LAB — TSH: TSH: 1.13 u[IU]/mL (ref 0.350–4.500)

## 2014-01-18 MED ORDER — MORPHINE SULFATE 4 MG/ML IJ SOLN
4.0000 mg | Freq: Once | INTRAMUSCULAR | Status: AC
  Administered 2014-01-18: 4 mg via INTRAVENOUS

## 2014-01-18 MED ORDER — HEPARIN SOD (PORK) LOCK FLUSH 100 UNIT/ML IV SOLN: 500.0000 [IU] | Freq: Once | INTRAVENOUS | Status: DC | PRN

## 2014-01-18 MED ORDER — LIDOCAINE HCL (CARDIAC) 20 MG/ML IV SOLN
INTRAVENOUS | Status: AC
  Filled 2014-01-18: qty 5

## 2014-01-18 MED ORDER — SUCCINYLCHOLINE CHLORIDE 20 MG/ML IJ SOLN
120.0000 mg | Freq: Once | INTRAMUSCULAR | Status: DC
Start: 2014-01-18 — End: 2014-01-18

## 2014-01-18 MED ORDER — MORPHINE SULFATE 10 MG/ML IJ SOLN
INTRAMUSCULAR | Status: AC
  Administered 2014-01-18: 4 mg
  Filled 2014-01-18: qty 2

## 2014-01-18 MED ORDER — SODIUM CHLORIDE 0.9 % IV SOLN
510.0000 mg | Freq: Once | INTRAVENOUS | Status: AC
  Administered 2014-01-18: 510 mg via INTRAVENOUS
  Filled 2014-01-18: qty 17

## 2014-01-18 MED ORDER — SUCCINYLCHOLINE CHLORIDE 20 MG/ML IJ SOLN
INTRAMUSCULAR | Status: AC
  Filled 2014-01-18: qty 1

## 2014-01-18 MED ORDER — SODIUM CHLORIDE 0.9 % IJ SOLN: 10.0000 mL | INTRAMUSCULAR | Status: DC | PRN

## 2014-01-18 MED ORDER — ROCURONIUM BROMIDE 50 MG/5ML IV SOLN
INTRAVENOUS | Status: AC
  Filled 2014-01-18: qty 2

## 2014-01-18 MED ORDER — SODIUM CHLORIDE 0.9 % IV SOLN
Freq: Once | INTRAVENOUS | Status: AC
  Administered 2014-01-18: 13:00:00 via INTRAVENOUS

## 2014-01-18 MED ORDER — ETOMIDATE 2 MG/ML IV SOLN
INTRAVENOUS | Status: AC
  Filled 2014-01-18: qty 20

## 2014-01-18 MED ORDER — IOHEXOL 350 MG/ML SOLN
100.0000 mL | Freq: Once | INTRAVENOUS | Status: AC | PRN
  Administered 2014-01-18: 100 mL via INTRAVENOUS

## 2014-01-18 MED ORDER — MORPHINE SULFATE 4 MG/ML IJ SOLN
4.0000 mg | Freq: Once | INTRAMUSCULAR | Status: DC
  Filled 2014-01-18: qty 1

## 2014-01-18 MED ORDER — IPRATROPIUM-ALBUTEROL 0.5-2.5 (3) MG/3ML IN SOLN
3.0000 mL | Freq: Once | RESPIRATORY_TRACT | Status: AC
  Administered 2014-01-18: 11:00:00 via RESPIRATORY_TRACT

## 2014-01-18 MED ORDER — SODIUM CHLORIDE 0.9 % IV BOLUS (SEPSIS)
500.0000 mL | Freq: Once | INTRAVENOUS | Status: AC
  Administered 2014-01-18: 500 mL via INTRAVENOUS

## 2014-01-18 MED ORDER — SODIUM CHLORIDE 0.9 % IV SOLN
3.0000 mg/kg | Freq: Once | INTRAVENOUS | Status: DC
  Filled 2014-01-18: qty 15

## 2014-01-18 MED ORDER — ETOMIDATE 2 MG/ML IV SOLN: 20.0000 mg | Freq: Once | INTRAVENOUS | Status: DC

## 2014-01-18 MED ORDER — SODIUM CHLORIDE 0.9 % IV SOLN
Freq: Once | INTRAVENOUS | Status: AC
  Administered 2014-01-18: 10:00:00 via INTRAVENOUS

## 2014-01-18 MED ORDER — MORPHINE SULFATE 4 MG/ML IJ SOLN
4.0000 mg | INTRAMUSCULAR | Status: AC
  Administered 2014-01-18: 4 mg via INTRAVENOUS
  Filled 2014-01-18: qty 1

## 2014-01-18 MED ORDER — IPRATROPIUM-ALBUTEROL 0.5-2.5 (3) MG/3ML IN SOLN
RESPIRATORY_TRACT | Status: AC
  Filled 2014-01-18: qty 3

## 2014-01-21 ENCOUNTER — Ambulatory Visit: Payer: Managed Care, Other (non HMO) | Admitting: Radiation Oncology

## 2014-01-21 ENCOUNTER — Ambulatory Visit
Admission: RE | Admit: 2014-01-21 | Payer: Managed Care, Other (non HMO) | Source: Ambulatory Visit | Admitting: Radiation Oncology

## 2014-01-21 LAB — CULTURE, BLOOD (ROUTINE X 2)

## 2014-01-22 LAB — CULTURE, BLOOD (ROUTINE X 2)

## 2014-01-24 ENCOUNTER — Other Ambulatory Visit (HOSPITAL_COMMUNITY): Payer: Self-pay | Admitting: Hematology and Oncology

## 2014-01-29 ENCOUNTER — Encounter (HOSPITAL_COMMUNITY): Payer: Self-pay | Admitting: *Deleted

## 2014-01-29 NOTE — Progress Notes (Signed)
I entered this note to find out the actual dosage of OPDIVO per insurance request.

## 2014-02-01 ENCOUNTER — Inpatient Hospital Stay (HOSPITAL_COMMUNITY): Payer: Managed Care, Other (non HMO)

## 2014-02-03 NOTE — ED Notes (Signed)
Dr.James at bedside to speak with husband.

## 2014-02-03 NOTE — ED Notes (Signed)
Pt's sats continue to deteriorate, latest sat 55 on non rebreather

## 2014-02-03 NOTE — ED Notes (Signed)
Pt transported to CT with RN and RT. Family remains at bedside. Pt following commands.

## 2014-02-03 NOTE — Progress Notes (Signed)
1000:  Pt's daughter called the nurses station stating pt is "coughing up blood".  Upon entering room, pt is found to be in respiratory distress, coughing up copious amounts of thick, bright red sputum. Caroleen Hamman, PA-C notified and to room.  Feraheme infusion discontinued, flushed site with NS 10 ml - port remains accessed.  Per orders of Kefalas, PA and Dr. Barnet Glasgow, pt was rapidly escorted to ER via wheelchair; pt was roomed in ED room 2, placed on O2 15L/NRB; report given to T. Vogler, RN at bedside.

## 2014-02-03 NOTE — ED Provider Notes (Signed)
CSN: [id number]     Arrival date & time 31-Jan-2014  1011 History  This chart was scribed for Tanna Furry, MD by Steva Colder, ED Scribe. The patient was seen in room APA02/APA02 at 10:20 AM.     Chief Complaint  Patient presents with  . Hemoptysis  . Shortness of Breath      HPI Heather Coffey is a 53 y.o. female with a history of lung cancer.  She did call for the patient's arrival that she had an episode of massive hemoptysis while getting an iron infusion at the cancer Center this morning. She was put into a wheelchair and brought emergently to the emergency room.  History of lung cancer. Undergoing chemotherapy. Has a known brain metastasis. Scheduled for focal radiotherapy to her solitary CNS lesion in 1 week.  She arrived to the emergency room with marked dyspnea and hypoxemia. I left the care of  another patient to attend to herher.  Daughter states that she does not know if the pt is a DNR. She states that her mother was dx in January with lung CA. Daughter states that the pt does chemo and radiation for her treatment and she goes every other week for treatment. Daughter states that the pt will go Monday for radiation for a spot that was found on her forehead. Daughter is unsure if the pt has any allergies.    Per Nursing note: pt was upstairs at the CA center and had a sudden onset of SOB and coughing up blood. Pt was receiving iron at the time her symptoms began. Call with pt PCP confirmed that the pt signed DNR paperwork.   LEVEL 5 CAVEAT: PATIENT IS UNRESPONSIVE   Past Medical History  Diagnosis Date  . Arthritis   . Anxiety   . Hyperlipidemia   . Tobacco abuse   . Shortness of breath     Hx: of breath with exertion  . Depression   . GERD (gastroesophageal reflux disease)   . H/O hiatal hernia   . Lung cancer    Past Surgical History  Procedure Laterality Date  . Tubal ligation    . Video bronchoscopy with endobronchial ultrasound N/A 05/03/2013    Procedure: VIDEO  BRONCHOSCOPY WITH ENDOBRONCHIAL ULTRASOUND;  Surgeon: Melrose Nakayama, MD;  Location: [facility];  Service: Thoracic;  Laterality: N/A;  . Nasal septum surgery    . Portacath placement Left 05/21/2013    Procedure: INSERTION PORT-A-CATH;  Surgeon: Melrose Nakayama, MD;  Location: [facility];  Service: Thoracic;  Laterality: Left;  . 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 schatzki's ring, distal one half the tubular esophagus severely inflamed. hiatal hernia bx= squamous mucosa with acute ulcer, candida  . Maloney dilation N/A 09/13/2013    Procedure: Venia Minks DILATION;  Surgeon: Daneil Dolin, MD;  [facility];  Service: Endoscopy;  Laterality: N/A;  . Savory dilation N/A 09/13/2013    Procedure: SAVORY DILATION;  Surgeon: Daneil Dolin, MD;  [facility];  Service: Endoscopy;  Laterality: N/A;  . Breast lumpectomy Right     Hx: of  . Vaginal hysterectomy      still has ovaries per pt  . Port a cath revision Left 10/11/2013    Procedure: LEFT PORT A CATH REVISION;  Surgeon: Melrose Nakayama, MD;  Location: [facility];  Service: Thoracic;  Laterality: Left;  LOCAL ANETHESIA PER DR. [place] History  Problem Relation  Age of Onset  . Cancer Sister     uterine  . Diabetes Sister   . COPD Sister   . COPD Mother   . Hypercholesterolemia Mother   . COPD Father   . COPD Brother   . Colon cancer Neg Hx    History  Substance Use Topics  . Smoking status: Current Every Day Smoker -- 0.25 packs/day for 35 years    Types: Cigarettes  . Smokeless tobacco: Never Used  . Alcohol Use: No   OB History   Grav Para Term Preterm Abortions TAB SAB Ect Mult Living                 Review of Systems  Unable to perform ROS: Patient unresponsive      Allergies  Augmentin; Codeine; and Tetracyclines & related  Home Medications   Prior to Admission medications   Medication Sig Start Date End Date  Taking? Authorizing Provider  ALPRAZolam Duanne Moron) 0.5 MG tablet Take 0.5 mg by mouth 3 (three) times daily.     Historical Provider, MD  benzonatate (TESSALON) 100 MG capsule Take 1 perle up to 4 times daily to control cough. 12/20/13   Farrel Gobble, MD  cyclobenzaprine (FLEXERIL) 10 MG tablet 10 mg 3 (three) times daily as needed.  10/16/13   Historical Provider, MD  dexamethasone (DECADRON) 4 MG tablet Take 1 tablet (4 mg total) by mouth 2 (two) times daily. 12/26/13   Maryann Mikhail, DO  Diphenhyd-Hydrocort-Nystatin (FIRST-DUKES MOUTHWASH) SUSP Use as directed 5 mLs in the mouth or throat 4 (four) times daily as needed (for throat).    Historical Provider, MD  dronabinol (MARINOL) 2.5 MG capsule Take 1 capsule (2.5 mg total) by mouth 2 (two) times daily before a meal. 11/28/13   Farrel Gobble, MD  esomeprazole (NEXIUM) 40 MG capsule Take 40 mg by mouth daily.    Historical Provider, MD  FLUoxetine (PROZAC) 20 MG tablet Take 20 mg by mouth daily.    Historical Provider, MD  fluticasone (FLONASE) 50 MCG/ACT nasal spray Place 2 sprays into both nostrils daily as needed for allergies.     Historical Provider, MD  Fluticasone-Salmeterol (ADVAIR) 250-50 MCG/DOSE AEPB Inhale 1 puff into the lungs 2 (two) times daily.    Historical Provider, MD  folic acid (FOLVITE) 1 MG tablet Take 1 tablet (1 mg total) by mouth daily. 12/26/13   Maryann Mikhail, DO  ibuprofen (ADVIL,MOTRIN) 200 MG tablet Take 400 mg by mouth every 6 (six) hours as needed.    Historical Provider, MD  Ipratropium-Albuterol (COMBIVENT) 20-100 MCG/ACT AERS respimat Inhale 1 puff into the lungs every 6 (six) hours. 11/28/13   Farrel Gobble, MD  levofloxacin (LEVAQUIN) 750 MG tablet Take 1 tablet (750 mg total) by mouth daily. 12/26/13   Maryann Mikhail, DO  lidocaine-prilocaine (EMLA) cream Apply a quarter size amount to port site 1 hour prior to chemo. Do not rub in. Cover with plastic wrap. 05/21/13   Farrel Gobble, MD   loratadine-pseudoephedrine (CLARITIN-D 24-HOUR) 10-240 MG per 24 hr tablet Take 1 tablet by mouth daily as needed for allergies.    Historical Provider, MD  montelukast (SINGULAIR) 10 MG tablet Take 10 mg by mouth daily as needed (for allergies).     Historical Provider, MD  Multiple Vitamin (MULTIVITAMIN) tablet Take 2 tablets by mouth daily. 2 Multifusion gummies daily    Historical Provider, MD  Nivolumab (OPDIVO IV) Inject into the vein every 14 (fourteen) days. To start 11/22/13 11/22/13  Historical Provider, MD  potassium chloride (K-DUR) 10 MEQ tablet Take 1 tablet (10 mEq total) by mouth 2 (two) times daily. 01/04/14 02/04/14  Baird [facility], PA-C  pravastatin (PRAVACHOL) 20 MG tablet Take 20 mg by mouth at bedtime.     Historical Provider, MD  prochlorperazine (COMPAZINE) 10 MG tablet  11/14/13   Historical Provider, MD  sucralfate (CARAFATE) 1 G tablet Take 1 g by mouth 4 (four) times daily.    Historical Provider, MD  traMADol (ULTRAM) 50 MG tablet Take 1-2 tablets (50-100 mg total) by mouth every 6 (six) hours as needed (pain). 10/11/13   Melrose Nakayama, MD   BP 161/100  Pulse 110  Resp 21  Ht 5\' 3"  (1.6 m)  Wt 115 lb (52.164 kg)  BMI 20.38 kg/m2  SpO2 74% Physical Exam  Nursing note and vitals reviewed. Constitutional: She appears toxic. She appears ill. She appears distressed.  Thin and weak appearing  HENT:  Head: Normocephalic.  Eyes: Conjunctivae are normal. Pupils are equal, round, and reactive to light. No scleral icterus.  Neck: Normal range of motion. Neck supple. No thyromegaly present.  Cardiovascular: Normal rate and regular rhythm.  Exam reveals no gallop and no friction rub.   No murmur heard. Pulmonary/Chest: No respiratory distress. She has decreased breath sounds. She has no wheezes. She has no rales.  Globally diminished bibasilar breath sounds. Overall poor air movement  Abdominal: Soft. Bowel sounds are normal. She exhibits no distension. There is no  tenderness. There is no rebound.  Musculoskeletal: Normal range of motion.  Neurological:  Is able to answer yes or no questions. Limited by dyspnea.  Skin: No rash noted.  Skin is pale and cool    ED Course  CRITICAL CARE Performed by: Tanna Furry Authorized by: Tanna Furry Total critical care time: 120 minutes Critical care start time: 01/24/2014 11:30 AM Critical care end time: 2014/01/24 1:30 PM Critical care time was exclusive of separately billable procedures and treating other patients. Critical care was necessary to treat or prevent imminent or life-threatening deterioration of the following conditions: respiratory failure. Critical care was time spent personally by me on the following activities: blood draw for specimens, development of treatment plan with patient or surrogate, discussions with consultants, interpretation of cardiac output measurements, evaluation of patient's response to treatment, examination of patient, obtaining history from patient or surrogate, ordering and performing treatments and interventions, ordering and review of laboratory studies, ordering and review of radiographic studies, pulse oximetry and re-evaluation of patient's condition.   (including critical care time) DIAGNOSTIC STUDIES: Oxygen Saturation is 74% on room air, low by my interpretation.    COORDINATION OF CARE: 10:37 AM-Discussed treatment plan with pt at bedside and pt agreed to plan.   Labs Review Labs Reviewed  CBC WITH DIFFERENTIAL - Abnormal; Notable for the following:    WBC 17.6 (*)    RBC 3.37 (*)    Hemoglobin 10.8 (*)    HCT 35.5 (*)    MCV 105.3 (*)    RDW 19.0 (*)    Neutrophils Relative % 79 (*)    Neutro Abs 13.9 (*)    Monocytes Absolute 1.2 (*)    All other components within normal limits  COMPREHENSIVE METABOLIC PANEL - Abnormal; Notable for the following:    Glucose, Bld 124 (*)    BUN 25 (*)    Albumin 3.1 (*)    Total Bilirubin <0.2 (*)    GFR calc non  Af Amer 84 (*)  All other components within normal limits  PRO B NATRIURETIC PEPTIDE - Abnormal; Notable for the following:    Pro B Natriuretic peptide (BNP) 367.1 (*)    All other components within normal limits  BLOOD GAS, ARTERIAL - Abnormal; Notable for the following:    pH, Arterial 6.920 (*)    pCO2 arterial 89.4 (*)    pO2, Arterial 75.0 (*)    Bicarbonate 17.4 (*)    Acid-base deficit 13.5 (*)    All other components within normal limits  CBG MONITORING, ED - Abnormal; Notable for the following:    Glucose-Capillary 184 (*)    All other components within normal limits  TROPONIN I  PROTIME-INR  TYPE AND SCREEN    Imaging Review Dg Chest Portable 1 View  Jan 31, 2014   CLINICAL DATA:  Hemoptysis, shortness of breath, and respiratory distress while receiving iron treatment today. History lung cancer.  EXAM: PORTABLE CHEST - 1 VIEW  COMPARISON:  01/11/2014  FINDINGS: Left subclavian Port-A-Cath remains in place with tip overlying the mid upper SVC. Cardiac silhouette is within normal limits for size. A curvilinear region of airspace opacity in the left mid lung is similar to the prior study. There is increased hazy opacity in the left greater than right lung bases which is new/increased from the prior study. No pleural effusion or pneumothorax is identified. There is moderate gaseous distention of the stomach. No acute osseous abnormality is identified.  IMPRESSION: 1. Similar appearance of left mid lung consolidation. 2. New/increased bibasilar lung opacities, which could reflect hemorrhage, aspiration, or pneumonia.   Electronically Signed   By: Logan Bores   On: Jan 31, 2014 10:33     EKG Interpretation None      MDM   Final diagnoses:  None    Discussion: Initially ABG shows marked acidosis with compensated respiratory acidosis. PH 6.92. PCO2 90. Bicarbonate 17. She is continued on CPAP. Her hypoxemia improves. Repeat ABG shows improvement pH 7.20. Patient to down to 49.  170 PO2. Weaning oxygen.  Per chest x-ray has lower lobe infiltrate,  presumed Hemorrhagic pneumonitis. CT shows worsening of left central lung malignancy with extension of the mass with areas of consolidation.  scattered groundglass opacities and central lobular nodules, concerning for inflammation v.  radiation pneumonitis v. malignancy, could be secondary to hemorrhagic event  Patient was discussed with hospitalist. However, she had another episode where she had hemoptysis and desaturated. After long discussion with family they would prefer continued only comfort measures. Patient was placed a nasal cannula. Given intermittent doses IV morphine for her discomfort. Condition is worsening. She continues to desaturate, becoming progressively bradycardic. No signs of distress.  14:46:  After slow progression of bradycardia and decreased her respiratory rate she became pulseless and apneic. Asystole on the monitor. 1446 PM time of death.       I personally performed the services described in this documentation, which was scribed in my presence. The recorded information has been reviewed and is accurate.     Tanna Furry, MD Jan 31, 2014 [phone]

## 2014-02-03 NOTE — ED Notes (Addendum)
Patient from cancer center with sudden onset of shortness of breath and coughing up blood. Patient was receiving iron at time of symptoms.

## 2014-02-03 NOTE — ED Notes (Signed)
Pt taken to [facility]. valuables sent home with husband.

## 2014-02-03 NOTE — ED Provider Notes (Signed)
Medical screening examination/treatment/procedure(s) were conducted as a shared visit with non-physician practitioner(s) and myself.  I personally evaluated the patient during the encounter.   EKG Interpretation   Date/Time:  February 04, 2014 10:27:39 EDT Ventricular Rate:  108 PR Interval:  132 QRS Duration: 78 QT Interval:  279 QTC Calculation: 374 R Axis:   31 Text Interpretation:  Sinus tachycardia Probable left atrial enlargement  Confirmed by Jeneen Rinks  MD, [place] ([id number]) on Feb 04, 2014 2:33:34 PM      Please see my additional dictation.  Tanna Furry, MD 01/24/14 [phone]

## 2014-02-03 NOTE — ED Notes (Signed)
Pt O2 92% on Bipap. Daughter remains at bedside with [facility].

## 2014-02-03 NOTE — ED Notes (Signed)
RN at bedside since pt arrival to ED. Pt presents with hemoptysis. Daughter at bedside. Left portacath accessed in cancer center this am, labs drawn and fluids started at [facility] placed on monitor. Bi-Pap ordered and placed by RT.

## 2014-02-03 NOTE — ED Provider Notes (Signed)
Chief complaint: Coughing up blood  Called to room 2 in the emergency department to find a 53 year old female who had previously been in the cancer department to receive cancer treatment. While receiving the treatment the patient began to cough up copious amounts of thick bloody material according to the practitioner, in the cancer department. The patient was immediately escorted to the emergency department.  Upon my arrival the patient is poorly responsive, pale, tachycardic, and had a pulse ox of the 70. The pulse ox went down to as low as 54. The patient had emergent chest x-ray requested as well as emergent bypass. The port was accessed for IV access.  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 She has a history of previous tobacco abuse, depression, GERD, arthritis, and anxiety..  The patient's daughter is present in the room and states she was unsure of the patient's resuscitation status. Probe into the Epic revealed that the patient is a DO NOT RESUSCITATE. This was confirmed by Mr.Kefalas, PA-C.Marland Kitchen  Physical exam:  HEENT: The eye grounds are pale, the buccal mucosa of the eye lids are pale. The pupils are reactive to light. The tongue is pale the oral mucosa is pale the airway is patent.  Neck: Full range of motion. No cervical lymphadenopathy appreciated.  Lungs: Bilateral rales and rhonchi. Decreased air movement at the bases. Rattling sound in sensation present. The patient is not speaking in complete sentences. There is a Port-A-Cath at the left upper chest. This is currently being accessed.  Heart tachycardia present of 120 beats per minute no rub appreciated. PMI slightly displaced.  Abdomen: Soft with good bowel sounds, nontender. No ascites.  Extremities: No pitting edema appreciated. Extremities are pale. Radial pulses are 2+ bilaterally, dorsalis pedis pulses are 2+ bilaterally.  Neurologic: Patient unable to follow my  commands to do adequate neurologic exam at this time.  ED course: Dr. Jeneen Rinks present.  ABG ordered.  The patient's daughter made aware of the patient's current status by Dr Jeneen Rinks and myself. The results of the portable chest x-ray were shared with the patient which showed a worsening of the opacities present, there is question of hemorrhage versus pneumonia. Chaplain present in the room.   After several minutes of BiPAP, the pulse oximetry is up to 84%. The patient remains weak and poorly responsive.  Recheck reveals decrease breath sounds at the bases. Rhonchi slightly improved after albuterol tx. Pt remains on biPAP. Pulse ox 85-88. Pt still working aggressively to breathe. Color only minimally improved.   Prognosis Poor. Pt's care to be continued by Dr Jeneen Rinks.  Critical Care:  35 min. CRITICAL CARE Performed by: Lenox Ahr Total critical care time: *72min** Critical care time was exclusive of separately billable procedures and treating other patients. Critical care was necessary to treat or prevent imminent or life-threatening deterioration. Critical care was time spent personally by me on the following activities: development of treatment plan with patient and/or surrogate as well as nursing, discussions with consultants, evaluation of patient's response to treatment, examination of patient, obtaining history from patient or surrogate, ordering and performing treatments and interventions, ordering and review of laboratory studies, ordering and review of radiographic studies, pulse oximetry and re-evaluation of patient's condition.  Dx: Bibasilar lung opacities with suspected hemorrhage       Lung Cancer with Mets.  Lenox Ahr, PA-C 01/20/14 2128

## 2014-02-03 NOTE — ED Notes (Addendum)
Pt O2 saturation decreased to 69%. Pt not following commands or responding. RN, RT and EDP at bedside. EDP spoke with pt husband, pt made comfort care. Family in family room to talk, pt cleaned and linen changed by RN x 2. Family returned to bedside.

## 2014-02-03 DEATH — deceased

## 2014-02-19 ENCOUNTER — Other Ambulatory Visit (HOSPITAL_COMMUNITY): Payer: Self-pay | Admitting: Oncology

## 2015-08-07 IMAGING — RF DG FLUORO GUIDE LUMBAR PUNCTURE
1 series · 1 of 1 positions shown · non-contrast
Comparison: none

CLINICAL DATA: Lung cancer, acute encephalopathy

[Series 1: run · 1 of 1 slices shown]
[im 1/1]
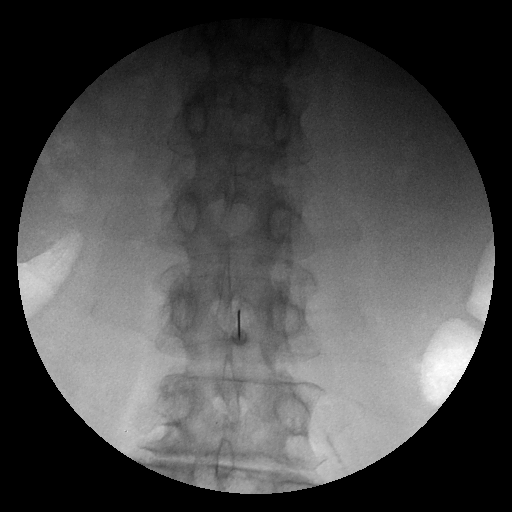

[1 of 1 positions shown; findings below may reference images not displayed]

EXAM:
DIAGNOSTIC LUMBAR PUNCTURE UNDER FLUOROSCOPIC GUIDANCE

FLUOROSCOPY TIME:  0 min 36 seconds

PROCEDURE:
Procedure, benefits, and risks were discussed with the patient,
including alternatives.

Patient's questions were answered.

Written informed consent was obtained.

Timeout protocol followed.

Patient placed prone.

L3-L4 disc space was localized under fluoroscopy.

Skin prepped and draped in usual sterile fashion.

Skin and soft tissues anesthetized with 2 mL of 1% lidocaine.

22 gauge needle was advanced into the spinal canal where clear
colorless CSF was encountered with an opening pressure of 3 cm H2O
(prone).

10 mL of CSF was obtained in 4 tubes for requested analysis.

Procedure tolerated very well by patient without immediate
complication.
IMPRESSION: Lumbar puncture as above.

## 2015-08-30 IMAGING — CT CT ANGIO CHEST
2 of 6 series · 6 of 36 positions shown · IV contrast (Omnipaque 300)
Comparison: 10/30/2013

CLINICAL DATA: Lung cancer. Short of breath. Altered level of
consciousness.

EXAM:
CT ANGIOGRAPHY CHEST WITH CONTRAST
TECHNIQUE: Multidetector CT imaging of the chest was performed using the
standard protocol during bolus administration of intravenous
contrast. Multiplanar CT image reconstructions and MIPs were
obtained to evaluate the vascular anatomy.
CONTRAST:  100mL OMNIPAQUE IOHEXOL 350 MG/ML SOLN

[Series 4: pe 3.0 b40f · axial · 0.57mm/px · z∈[-460,-256]mm · 5 of 103 slices shown]
[im 18/103  lung]
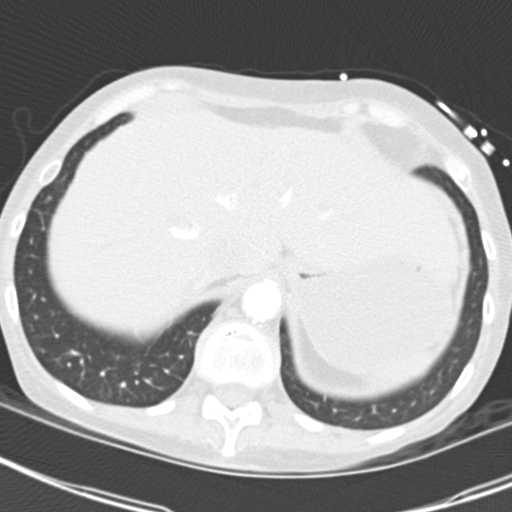
[im 35/103  mediastinal]
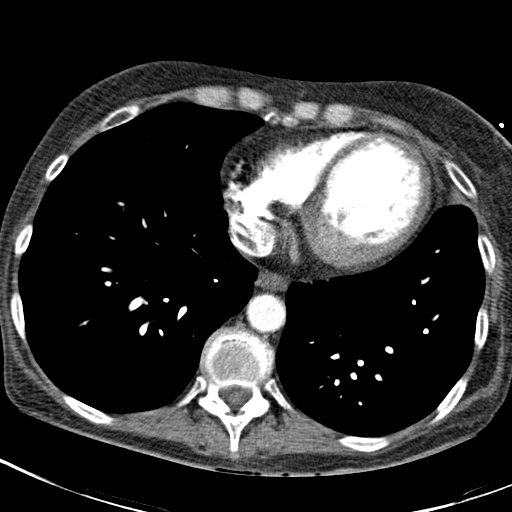
[im 52/103  lung]
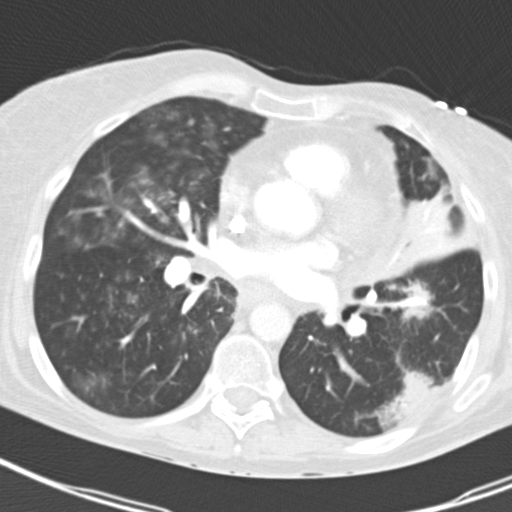
[im 69/103  mediastinal]
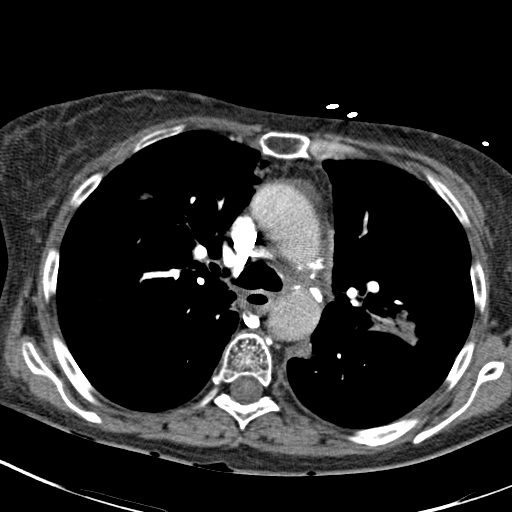
[im 86/103  lung]
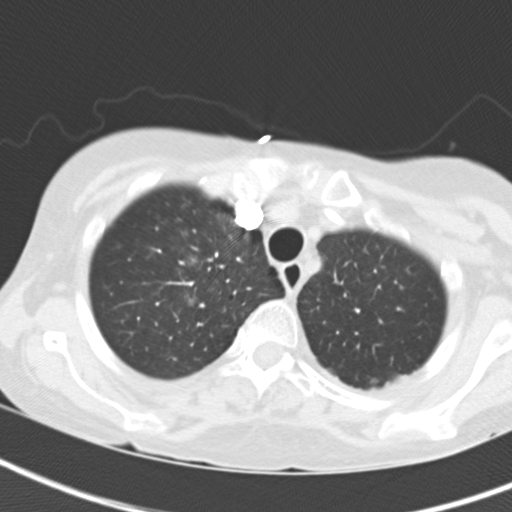

[Series 6: mpr coronal pe 3mm · coronal · 0.57mm/px · 1 of 79 slices shown]
[im 40/79  mediastinal]
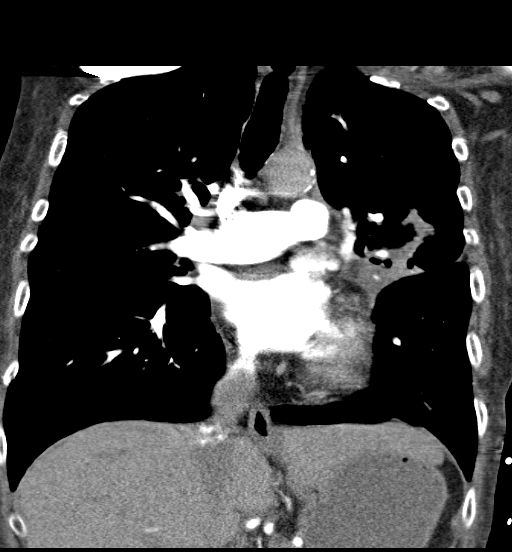

[6 of 36 positions shown; findings below may reference images not displayed]

FINDINGS: There are no filling defects in the pulmonary arterial tree to
suggest acute pulmonary thromboembolism.

Stable Port-A-Cath with its tip in the upper SVC.

No evidence of mediastinal adenopathy by measurement criteria. There
is a borderline enlarged lymph node in the thoracic inlet on image
14 of series 4.

No pneumothorax.  No pleural effusion.

Cavitary left upper lobe central lung mass worse and larger. The
central portion with cavitation now measures 2.8 x 3.7 cm and
previously measured 3.8 x 1.7. There is masslike consolidation
extending superiorly from the cavitary lung mass into the left upper
lobe apex, measuring 4.4 x 2.6 cm on image 39. There is also
extension into the adjacent left lower lobe across the greater
fissure measuring 2.6 x 3.2 cm. There is also some extension into
the posterior left lower lobe 0 with a separate area of masslike
consolidation measuring 4.1 x 1.8 cm.

Pulmonary parenchymal changes have significantly worsened since the
prior study. There are ground-glass and centrilobular nodules
scattered throughout both lungs, worse in the right lung.

Review of the MIP images confirms the above findings.
IMPRESSION: No evidence of acute pulmonary thromboembolism.

Left central lung malignancy has worsened with increasing extension
into the left upper and left lower lobes characterized by masslike
areas of consolidation.

Interval development of scattered ground-glass opacities and
centrilobular nodules. This is worrisome for inflammatory process or
radiation pneumonitis. Diffuse malignancy is a secondary
consideration.
